# Patient Record
Sex: Female | Born: 1970 | Race: White | Hispanic: No | Marital: Married | Smoking: Never smoker
Health system: Southern US, Community
[De-identification: ages and names within clinical notes are randomized; demographics above are authoritative.]

## PROBLEM LIST (undated history)

## (undated) DIAGNOSIS — I4719 Other supraventricular tachycardia: Secondary | ICD-10-CM

## (undated) DIAGNOSIS — Z800 Family history of malignant neoplasm of digestive organs: Secondary | ICD-10-CM

## (undated) DIAGNOSIS — I491 Atrial premature depolarization: Secondary | ICD-10-CM

## (undated) DIAGNOSIS — I4729 Other ventricular tachycardia: Secondary | ICD-10-CM

## (undated) DIAGNOSIS — I471 Supraventricular tachycardia: Secondary | ICD-10-CM

## (undated) DIAGNOSIS — Z972 Presence of dental prosthetic device (complete) (partial): Secondary | ICD-10-CM

## (undated) DIAGNOSIS — R079 Chest pain, unspecified: Secondary | ICD-10-CM

## (undated) DIAGNOSIS — Z801 Family history of malignant neoplasm of trachea, bronchus and lung: Secondary | ICD-10-CM

## (undated) DIAGNOSIS — Z803 Family history of malignant neoplasm of breast: Secondary | ICD-10-CM

## (undated) DIAGNOSIS — I340 Nonrheumatic mitral (valve) insufficiency: Secondary | ICD-10-CM

## (undated) DIAGNOSIS — I10 Essential (primary) hypertension: Secondary | ICD-10-CM

## (undated) HISTORY — DX: Nonrheumatic mitral (valve) insufficiency: I34.0

## (undated) HISTORY — DX: Family history of malignant neoplasm of digestive organs: Z80.0

## (undated) HISTORY — DX: Supraventricular tachycardia: I47.1

## (undated) HISTORY — DX: Other ventricular tachycardia: I47.29

## (undated) HISTORY — PX: CHOLECYSTECTOMY: SHX55

## (undated) HISTORY — DX: Chest pain, unspecified: R07.9

## (undated) HISTORY — PX: CARPAL TUNNEL RELEASE: SHX101

## (undated) HISTORY — PX: TUBAL LIGATION: SHX77

## (undated) HISTORY — DX: Family history of malignant neoplasm of trachea, bronchus and lung: Z80.1

## (undated) HISTORY — DX: Other supraventricular tachycardia: I47.19

## (undated) HISTORY — DX: Family history of malignant neoplasm of breast: Z80.3

## (undated) HISTORY — DX: Atrial premature depolarization: I49.1

---

## 2008-06-11 ENCOUNTER — Emergency Department: Payer: Self-pay | Admitting: Internal Medicine

## 2011-07-19 ENCOUNTER — Ambulatory Visit: Payer: Self-pay

## 2011-10-31 ENCOUNTER — Ambulatory Visit (INDEPENDENT_AMBULATORY_CARE_PROVIDER_SITE_OTHER): Payer: BC Managed Care – PPO | Admitting: Family Medicine

## 2011-10-31 ENCOUNTER — Encounter: Payer: Self-pay | Admitting: Family Medicine

## 2011-10-31 VITALS — BP 100/70 | HR 60 | Temp 98.5°F | Ht 65.25 in | Wt 143.0 lb

## 2011-10-31 DIAGNOSIS — Z136 Encounter for screening for cardiovascular disorders: Secondary | ICD-10-CM

## 2011-10-31 DIAGNOSIS — M7710 Lateral epicondylitis, unspecified elbow: Secondary | ICD-10-CM | POA: Insufficient documentation

## 2011-10-31 DIAGNOSIS — Z0000 Encounter for general adult medical examination without abnormal findings: Secondary | ICD-10-CM

## 2011-10-31 LAB — BASIC METABOLIC PANEL
BUN: 12 mg/dL (ref 6–23)
Calcium: 9 mg/dL (ref 8.4–10.5)
Creatinine, Ser: 0.8 mg/dL (ref 0.4–1.2)
GFR: 87.11 mL/min (ref >60.00)
Glucose, Bld: 87 mg/dL (ref 70–99)
Sodium: 141 mEq/L (ref 135–145)

## 2011-10-31 NOTE — Progress Notes (Signed)
  Subjective:    Patient ID: Tammy Acevedo, female    DOB: May 24, 1971, 41 y.o.   MRN: [id number]  HPI  41 yo here to establish care.  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 Has noticed aching in lateral aspects of elbow, worse with movement, can radiate to forearm. No tingling, no RUE weakness.  G2P2- goes to [employer] [facility]. Normal pap smear and mammogram in 07/2011.  Very healthy with no other complaints.  No past medical history on file. Past Surgical History  Procedure Date  . Cholecystectomy    History  Substance Use Topics  . Smoking status: Never Smoker   . Smokeless tobacco: Never Used  . Alcohol Use: No   Family History  Problem Relation Age of Onset  . Diabetes Mother   . COPD Father    No Known Allergies No current outpatient prescriptions on file prior to visit.   The PMH, PSH, Social History, Family History, Medications, and allergies have been reviewed in [facility], and have been updated if relevant.   Review of Systems See HPI    Patient reports no  vision/ hearing changes,anorexia, weight change, fever ,adenopathy, persistant / recurrent hoarseness, swallowing issues, chest pain, edema,persistant / recurrent cough, hemoptysis, dyspnea(rest, exertional, paroxysmal nocturnal), gastrointestinal  bleeding (melena, rectal bleeding), abdominal pain  Objective:   Physical Exam BP 100/70  Pulse 60  Temp(Src) 98.5 F (36.9 C) (Oral)  Ht 5' 5.25" (1.657 m)  Wt 143 lb (64.864 kg)  BMI 23.61 kg/m2  LMP 10/28/2011  General:  Well-developed,well-nourished,in no acute distress; alert,appropriate and cooperative throughout examination Head:  normocephalic and atraumatic.   Eyes:  vision grossly intact, pupils equal, pupils round, and pupils reactive to light.   Ears:  R ear normal and L ear normal.   Lungs:  Normal respiratory effort, chest expands symmetrically. Lungs are clear to auscultation,  no crackles or wheezes. Heart:  Normal rate and regular rhythm. S1 and S2 normal without gallop, murmur, click, rub or other extra sounds. Msk:  No deformity or scoliosis noted of thoracic or lumbar spine.   Extremities:  No clubbing, cyanosis, edema, or deformity noted with normal full range of motion of all joints.  Mild TTP over lateral Neurologic:  alert & oriented X3 and gait normal.   Skin:  Intact without suspicious lesions or rashes Psych:  Cognition and judgment appear intact. Alert and cooperative with normal attention span and concentration. No apparent delusions, illusions, hallucinations        Assessment & Plan:   1. Lateral epicondylitis     New.   Discussed exercises from sports med handout and other supportive measures.   See pt instructions for full details. The patient indicates understanding of these issues and agrees with the plan.

## 2011-10-31 NOTE — Patient Instructions (Signed)
Great to meet you. Try to avoid painful activities (overhead activities, lifting with extended arm) as much as possible. Aleve and/or tylenol as needed for pain [place] exercises as in handout.

## 2012-03-13 ENCOUNTER — Ambulatory Visit: Payer: BC Managed Care – PPO | Admitting: Family Medicine

## 2012-03-20 ENCOUNTER — Ambulatory Visit (INDEPENDENT_AMBULATORY_CARE_PROVIDER_SITE_OTHER): Payer: BC Managed Care – PPO | Admitting: Family Medicine

## 2012-03-20 ENCOUNTER — Encounter: Payer: Self-pay | Admitting: Family Medicine

## 2012-03-20 VITALS — BP 112/68 | HR 60 | Temp 97.9°F | Wt 146.0 lb

## 2012-03-20 DIAGNOSIS — G5603 Carpal tunnel syndrome, bilateral upper limbs: Secondary | ICD-10-CM | POA: Insufficient documentation

## 2012-03-20 DIAGNOSIS — G5600 Carpal tunnel syndrome, unspecified upper limb: Secondary | ICD-10-CM

## 2012-03-20 MED ORDER — MELOXICAM 15 MG PO TABS: 15.0000 mg | ORAL_TABLET | Freq: Every day | ORAL | Status: DC

## 2012-03-20 NOTE — Progress Notes (Signed)
.    Subjective:    Patient ID: Tammy Acevedo, female    DOB: [birth year], 41 y.o.   MRN: [id number]  HPI  41 yo here for bilateral hand numbness-waking her up from her sleep. Now occuring every night- can "shake it out" when she changes position. No UE weakness.  She has a wrist splint for her right hand but she finds it uncomfortable to wear although helps with tingling.    No past medical history on file. Past Surgical History  Procedure Date  . Cholecystectomy    History  Substance Use Topics  . Smoking status: Never Smoker   . Smokeless tobacco: Never Used  . Alcohol Use: No   Family History  Problem Relation Age of Onset  . Diabetes Mother   . COPD Father    No Known Allergies No current outpatient prescriptions on file prior to visit.   The PMH, PSH, Social History, Family History, Medications, and allergies have been reviewed in [facility], and have been updated if relevant.   Review of Systems See HPI    Patient reports no  vision/ hearing changes,anorexia, weight change, fever ,adenopathy, persistant / recurrent hoarseness, swallowing issues, chest pain, edema,persistant / recurrent cough, hemoptysis, dyspnea(rest, exertional, paroxysmal nocturnal), gastrointestinal  bleeding (melena, rectal bleeding), abdominal pain  Objective:   Physical Exam BP 112/68  Pulse 60  Temp 97.9 F (36.6 C)  Wt 146 lb (66.225 kg)  General:  Well-developed,well-nourished,in no acute distress; alert,appropriate and cooperative throughout examination Head:  normocephalic and atraumatic.   Eyes:  vision grossly intact, pupils equal, pupils round, and pupils reactive to light.   Ears:  R ear normal and L ear normal.   Lungs:  Normal respiratory effort, chest expands symmetrically. Lungs are clear to auscultation, no crackles or wheezes. Heart:  Normal rate and regular rhythm. S1 and S2 normal without gallop, murmur, click, rub or other extra sounds. Msk:  No deformity or scoliosis noted of  thoracic or lumbar spine.   Extremities:  No clubbing, cyanosis, edema, or deformity noted with normal full range of motion of all joints.   Pos tinnels bilateral, neg phallen Neurologic:  alert & oriented X3 and gait normal.   Skin:  Intact without suspicious lesions or rashes Psych:  Cognition and judgment appear intact. Alert and cooperative with normal attention span and concentration. No apparent delusions, illusions, hallucinations     Assessment & Plan:    1. Carpal tunnel syndrome on both sides  Ambulatory referral to Orthopedic Surgery   Deteriorated- will refer to ortho for further work up and treatment options. The patient indicates understanding of these issues and agrees with the plan.

## 2012-03-20 NOTE — Patient Instructions (Addendum)
Carpal Tunnel Syndrome The carpal tunnel is a narrow hollow area in the wrist. It is formed by the wrist bones and ligaments. Nerves, blood vessels, and tendons (cord like structures which attach muscle to bone) on the palm side (the side of your hand in the direction your fingers bend) of your hand pass through the carpal tunnel. Repeated wrist motion or certain diseases may cause swelling within the tunnel. (That is why these are called repetitive trauma (damage caused by over use) disorders. It is also a common problem in late pregnancy.) This swelling pinches the main nerve in the wrist (median nerve) and causes the painful condition called carpal tunnel syndrome. A feeling of "pins and needles" may be noticed in the fingers or hand; however, the entire arm may ache from this condition. Carpal tunnel syndrome may clear up by itself. Cortisone injections may help. Sometimes, an operation may be needed to free the pinched nerve. An electromyogram (a type of test) may be needed to confirm this diagnosis (learning what is wrong). This is a test which measures nerve conduction. The nerve conduction is usually slowed in a carpal tunnel syndrome. HOME CARE INSTRUCTIONS   If your caregiver prescribed medication to help reduce swelling, take as directed.   If you were given a splint to keep your wrist from bending, use it as instructed. It is important to wear the splint at night. Use the splint for as long as you have pain or numbness in your hand, arm or wrist. This may take 1 to 2 months.   If you have pain at night, it may help to rub or shake your hand, or elevate your hand above the level of your heart (the center of your chest).   It is important to give your wrist a rest by stopping the activities that are causing the problem. If your symptoms (problems) are work-related, you may need to talk to your employer about changing to a job that does not require using your wrist.   Only take over-the-counter  or prescription medicines for pain, discomfort, or fever as directed by your caregiver.   Following periods of extended use, particularly strenuous use, apply an ice pack wrapped in a towel to the anterior (palm) side of the affected wrist for 20 to 30 minutes. Repeat as needed three to four times per day. This will help reduce the swelling.   Follow all instructions for follow-up with your caregiver. This includes any orthopedic referrals, physical therapy, and rehabilitation. Any delay in obtaining necessary care could result in a delay or failure of your condition to heal.  SEEK IMMEDIATE MEDICAL CARE IF:   You are still having pain and numbness following a week of treatment.   You develop new, unexplained symptoms.   Your current symptoms are getting worse and are not helped or controlled with medications.  MAKE SURE YOU:   Understand these instructions.   Will watch your condition.   Will get help right away if you are not doing well or get worse.  Document Released: 09/14/2000 Document Revised: 09/06/2011 Document Reviewed: 08/03/2011 Exit[employer] Patient Information 2012 [employer]. 

## 2012-03-28 ENCOUNTER — Ambulatory Visit (INDEPENDENT_AMBULATORY_CARE_PROVIDER_SITE_OTHER): Payer: BC Managed Care – PPO | Admitting: Family Medicine

## 2012-03-28 DIAGNOSIS — M549 Dorsalgia, unspecified: Secondary | ICD-10-CM

## 2012-03-28 DIAGNOSIS — M542 Cervicalgia: Secondary | ICD-10-CM

## 2012-03-28 MED ORDER — CYCLOBENZAPRINE HCL 10 MG PO TABS: 10.0000 mg | ORAL_TABLET | Freq: Three times a day (TID) | ORAL | Status: AC | PRN

## 2012-03-28 NOTE — Patient Instructions (Addendum)
You are going to be really sore the next few days. Take muscle relaxants (flexeril as directed). Use heat. Take the meloxicam. Call me if not getting better in next week.
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
## 2012-03-31 ENCOUNTER — Encounter: Payer: Self-pay | Admitting: *Deleted

## 2012-03-31 ENCOUNTER — Telehealth: Payer: Self-pay | Admitting: *Deleted

## 2012-03-31 NOTE — Telephone Encounter (Signed)
Patient called stating that she is not able to go back to work today and would like a work note to be out this week. Please advise.

## 2012-03-31 NOTE — Telephone Encounter (Signed)
Ok to write note as pt requests. 

## 2012-03-31 NOTE — Telephone Encounter (Signed)
Letter written and patient notified. Letter placed up front for pick up.

## 2012-12-12 ENCOUNTER — Other Ambulatory Visit: Payer: Self-pay | Admitting: Family Medicine

## 2012-12-12 DIAGNOSIS — Z0000 Encounter for general adult medical examination without abnormal findings: Secondary | ICD-10-CM

## 2012-12-12 DIAGNOSIS — Z136 Encounter for screening for cardiovascular disorders: Secondary | ICD-10-CM

## 2012-12-15 ENCOUNTER — Other Ambulatory Visit (INDEPENDENT_AMBULATORY_CARE_PROVIDER_SITE_OTHER): Payer: BC Managed Care – PPO

## 2012-12-15 DIAGNOSIS — Z0000 Encounter for general adult medical examination without abnormal findings: Secondary | ICD-10-CM

## 2012-12-15 DIAGNOSIS — Z136 Encounter for screening for cardiovascular disorders: Secondary | ICD-10-CM

## 2012-12-15 LAB — COMPREHENSIVE METABOLIC PANEL
Albumin: 3.9 g/dL (ref 3.5–5.2)
CO2: 28 mEq/L (ref 19–32)
Chloride: 102 mEq/L (ref 96–112)
GFR: 82.91 mL/min (ref >60.00)
Glucose, Bld: 107 mg/dL — ABNORMAL HIGH (ref 70–99)
Potassium: 3.9 mEq/L (ref 3.5–5.1)
Sodium: 137 mEq/L (ref 135–145)
Total Protein: 7 g/dL (ref 6.0–8.3)

## 2012-12-15 LAB — LIPID PANEL
Cholesterol: 146 mg/dL (ref 0–200)
LDL Cholesterol: 93 mg/dL (ref 0–99)

## 2012-12-22 ENCOUNTER — Encounter: Payer: Self-pay | Admitting: Family Medicine

## 2012-12-22 ENCOUNTER — Ambulatory Visit (INDEPENDENT_AMBULATORY_CARE_PROVIDER_SITE_OTHER): Payer: BC Managed Care – PPO | Admitting: Family Medicine

## 2012-12-22 ENCOUNTER — Other Ambulatory Visit (HOSPITAL_COMMUNITY)
Admission: RE | Admit: 2012-12-22 | Discharge: 2012-12-22 | Disposition: A | Discharge status: Home / Self Care | Payer: BC Managed Care – PPO | Source: Ambulatory Visit | Attending: Family Medicine | Admitting: Family Medicine

## 2012-12-22 VITALS — BP 122/70 | HR 64 | Temp 98.0°F | Ht 65.5 in | Wt 150.0 lb

## 2012-12-22 DIAGNOSIS — G5600 Carpal tunnel syndrome, unspecified upper limb: Secondary | ICD-10-CM

## 2012-12-22 DIAGNOSIS — Z0000 Encounter for general adult medical examination without abnormal findings: Secondary | ICD-10-CM

## 2012-12-22 DIAGNOSIS — Z01419 Encounter for gynecological examination (general) (routine) without abnormal findings: Secondary | ICD-10-CM | POA: Insufficient documentation

## 2012-12-22 DIAGNOSIS — Z23 Encounter for immunization: Secondary | ICD-10-CM

## 2012-12-22 DIAGNOSIS — Z1151 Encounter for screening for human papillomavirus (HPV): Secondary | ICD-10-CM | POA: Insufficient documentation

## 2012-12-22 DIAGNOSIS — G5603 Carpal tunnel syndrome, bilateral upper limbs: Secondary | ICD-10-CM

## 2012-12-22 DIAGNOSIS — Z1231 Encounter for screening mammogram for malignant neoplasm of breast: Secondary | ICD-10-CM

## 2012-12-22 NOTE — Addendum Note (Signed)
Addended by: Dianne Dun on: 12/22/2012 09:43 AM   Modules accepted: Orders

## 2012-12-22 NOTE — Patient Instructions (Addendum)
Good to see you. Please call [facility] to set up your mammogram.

## 2012-12-22 NOTE — Addendum Note (Signed)
Addended by: Eliezer Bottom on: 12/22/2012 10:20 AM   Modules accepted: Orders

## 2012-12-22 NOTE — Progress Notes (Addendum)
Subjective:    Patient ID: Tammy Acevedo, female    DOB: 05/08/1971, 42 y.o.   MRN: [id number]  HPI  Very pleasant 42 yo G2P2 here for CPX.    G2P2- had been going to [place] [facility] but wants to start having her pap smears done here.  LMP was last week.  No h/o abnormal pap smears.   She thinks her last pap smear and mammogram were in 07/2011.    Lab Results  Component Value Date   CHOL 146 12/15/2012   HDL 38.90* 12/15/2012   LDLCALC 93 12/15/2012   TRIG 72.0 12/15/2012   CHOLHDL 4 12/15/2012   Bilateral carpal tunnel- wearing brace, saw ortho in [place].  Cortisone injections did not help and she would like a second opinion.  No past medical history on file. Past Surgical History  Procedure Laterality Date  . Cholecystectomy     History  Substance Use Topics  . Smoking status: Never Smoker   . Smokeless tobacco: Never Used  . Alcohol Use: No   Family History  Problem Relation Age of Onset  . Diabetes Mother   . COPD Father    No Known Allergies No current outpatient prescriptions on file prior to visit.   No current facility-administered medications on file prior to visit.   The PMH, PSH, Social History, Family History, Medications, and allergies have been reviewed in [facility], and have been updated if relevant.   Review of Systems See HPI    Patient reports no  vision/ hearing changes,anorexia, weight change, fever ,adenopathy, persistant / recurrent hoarseness, swallowing issues, chest pain, edema,persistant / recurrent cough, hemoptysis, dyspnea(rest, exertional, paroxysmal nocturnal), gastrointestinal  bleeding (melena, rectal bleeding), abdominal pain  Objective:   Physical Exam BP 122/70  Pulse 64  Temp(Src) 98 F (36.7 C)  Ht 5' 5.5" (1.664 m)  Wt 150 lb (68.04 kg)  BMI 24.57 kg/m2   General:  Well-developed,well-nourished,in no acute distress; alert,appropriate and cooperative throughout examination Head:  normocephalic and atraumatic.   Eyes:  vision  grossly intact, pupils equal, pupils round, and pupils reactive to light.   Ears:  R ear normal and L ear normal.   Nose:  no external deformity.   Mouth:  good dentition.   Neck:  No deformities, masses, or tenderness noted. Breasts:  No mass, nodules, thickening, tenderness, bulging, retraction, inflamation, nipple discharge or skin changes noted.   Lungs:  Normal respiratory effort, chest expands symmetrically. Lungs are clear to auscultation, no crackles or wheezes. Heart:  Normal rate and regular rhythm. S1 and S2 normal without gallop, murmur, click, rub or other extra sounds. Abdomen:  Bowel sounds positive,abdomen soft and non-tender without masses, organomegaly or hernias noted. Rectal:  no external abnormalities.   Genitalia:  Pelvic Exam:        External: normal female genitalia without lesions or masses        Vagina: normal without lesions or masses        Cervix: normal without lesions or masses        Adnexa: normal bimanual exam without masses or fullness        Uterus: normal by palpation        Pap smear: performed Msk:  No deformity or scoliosis noted of thoracic or lumbar spine.   Extremities:  No clubbing, cyanosis, edema, or deformity noted with normal full range of motion of all joints.   Neurologic:  alert & oriented X3 and gait normal.   Skin:  Intact without  suspicious lesions or rashes Cervical Nodes:  No lymphadenopathy noted Axillary Nodes:  No palpable lymphadenopathy Psych:  Cognition and judgment appear intact. Alert and cooperative with normal attention span and concentration. No apparent delusions, illusions, hallucinations         Assessment & Plan:   1. Routine general medical examination at a health care facility Reviewed preventive care protocols, scheduled due services, and updated immunizations Discussed nutrition, exercise, diet, and healthy lifestyle.  Tdap   3. Carpal tunnel syndrome on both sides  - Ambulatory referral to Orthopedic  Surgery

## 2012-12-22 NOTE — Addendum Note (Signed)
Addended by: Eliezer Bottom on: 12/22/2012 12:15 PM   Modules accepted: Orders

## 2012-12-26 ENCOUNTER — Encounter: Payer: Self-pay | Admitting: Family Medicine

## 2012-12-26 ENCOUNTER — Encounter: Payer: Self-pay | Admitting: *Deleted

## 2012-12-26 LAB — HM PAP SMEAR: HM Pap smear: NORMAL

## 2013-01-07 ENCOUNTER — Encounter: Payer: Self-pay | Admitting: Family Medicine

## 2013-01-07 ENCOUNTER — Ambulatory Visit: Payer: Self-pay | Admitting: Family Medicine

## 2013-01-08 ENCOUNTER — Ambulatory Visit: Payer: Self-pay | Admitting: Family Medicine

## 2013-01-09 ENCOUNTER — Encounter: Payer: Self-pay | Admitting: Family Medicine

## 2013-01-12 ENCOUNTER — Other Ambulatory Visit: Payer: Self-pay | Admitting: Family Medicine

## 2013-01-12 DIAGNOSIS — R928 Other abnormal and inconclusive findings on diagnostic imaging of breast: Secondary | ICD-10-CM

## 2013-01-21 ENCOUNTER — Ambulatory Visit: Payer: Self-pay | Admitting: Specialist

## 2013-01-28 ENCOUNTER — Ambulatory Visit: Payer: Self-pay | Admitting: Specialist

## 2013-03-31 ENCOUNTER — Emergency Department: Payer: Self-pay | Admitting: Emergency Medicine

## 2013-07-14 ENCOUNTER — Encounter: Payer: Self-pay | Admitting: Family Medicine

## 2013-07-14 ENCOUNTER — Ambulatory Visit: Payer: Self-pay | Admitting: Family Medicine

## 2013-07-15 ENCOUNTER — Encounter: Payer: Self-pay | Admitting: Family Medicine

## 2014-04-13 ENCOUNTER — Encounter: Payer: BC Managed Care – PPO | Admitting: Family Medicine

## 2014-05-03 ENCOUNTER — Ambulatory Visit: Payer: Self-pay | Admitting: Internal Medicine

## 2015-01-14 ENCOUNTER — Other Ambulatory Visit: Payer: Self-pay | Admitting: Internal Medicine

## 2015-01-14 DIAGNOSIS — Z1231 Encounter for screening mammogram for malignant neoplasm of breast: Secondary | ICD-10-CM

## 2015-01-21 NOTE — Op Note (Signed)
PATIENT NAME:  Randolm IdolDWARDS, Torra L MR#:  [id number] DATE OF BIRTH:  November 02, 1970  DATE OF PROCEDURE:  01/28/2013  PREOPERATIVE DIAGNOSIS: Right carpal tunnel syndrome.   POSTOPERATIVE DIAGNOSIS: Right carpal tunnel syndrome.  PROCEDURE: Right carpal tunnel release.   SURGEON: Myra Rudehristopher Samaa Ueda, M.D.   ANESTHESIA: General.   COMPLICATIONS: None.   TOURNIQUET TIME: 15 minutes.   PROCEDURE: After adequate induction of general anesthesia, the right upper extremity is thoroughly prepped with alcohol and ChloraPrep and draped in standard sterile fashion. The extremity is wrapped out with the Esmarch bandage and pneumatic tourniquet elevated to 250 mmHg. Under loupe magnification, standard volar carpal tunnel incision is made and the dissection carefully carried down to the transverse retinacular ligament. This is incised in the midportion with the knife. The distal release is performed with the small scissors. The proximal release is performed with the small scissors and the carpal tunnel scissors. There is seen to be moderate compression of the nerve directly beneath the ligament. Careful check is made both proximally and distally to ensure that complete release had been obtained. The wound is thoroughly irrigated multiple times. Skin edges are infiltrated with 0.5% plain Marcaine. The skin is closed with 4-0 nylon. A soft bulky dressing is applied. The patient is returned to the recovery room in satisfactory condition having tolerated the procedure quite well. ____________________________ Clare Gandyhristopher E. Andray Assefa, MD ces:aw D: 01/29/2013 07:36:31 ET T: 01/29/2013 07:47:03 ET JOB#: [id number]  cc: Clare Gandyhristopher E. Basil Buffin, MD, <Dictator> Clare GandyHRISTOPHER E Samyiah Halvorsen MD ELECTRONICALLY SIGNED 01/29/2013 13:33

## 2015-05-06 ENCOUNTER — Ambulatory Visit
Admission: RE | Admit: 2015-05-06 | Discharge: 2015-05-06 | Disposition: A | Discharge status: Home / Self Care | Payer: 59 | Source: Ambulatory Visit | Attending: Internal Medicine | Admitting: Internal Medicine

## 2015-05-06 DIAGNOSIS — Z1231 Encounter for screening mammogram for malignant neoplasm of breast: Secondary | ICD-10-CM | POA: Diagnosis not present

## 2015-07-12 ENCOUNTER — Encounter: Payer: Self-pay | Admitting: Internal Medicine

## 2015-07-13 ENCOUNTER — Encounter: Payer: Self-pay | Admitting: Internal Medicine

## 2015-07-13 ENCOUNTER — Ambulatory Visit (INDEPENDENT_AMBULATORY_CARE_PROVIDER_SITE_OTHER): Payer: 59 | Admitting: Internal Medicine

## 2015-07-13 VITALS — BP 130/70 | HR 72 | Ht 65.5 in | Wt 149.4 lb

## 2015-07-13 DIAGNOSIS — K219 Gastro-esophageal reflux disease without esophagitis: Secondary | ICD-10-CM | POA: Diagnosis not present

## 2015-07-13 DIAGNOSIS — R000 Tachycardia, unspecified: Secondary | ICD-10-CM | POA: Diagnosis not present
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
## 2015-07-14 LAB — H. PYLORI ANTIBODY, IGG: H Pylori IgG: <0.9 U/mL (ref 0.0–0.8)

## 2015-07-14 LAB — THYROID PANEL WITH TSH
FREE THYROXINE INDEX: 2.4 (ref 1.2–4.9)
T3 Uptake Ratio: 24 % (ref 24–39)
T4 TOTAL: 9.8 ug/dL (ref 4.5–12.0)
TSH: 2.42 u[IU]/mL (ref 0.450–4.500)

## 2015-07-14 LAB — CBC WITH DIFFERENTIAL/PLATELET
BASOS ABS: 0 10*3/uL (ref 0.0–0.2)
BASOS: 0 %
EOS (ABSOLUTE): 0.1 10*3/uL (ref 0.0–0.4)
Eos: 1 %
Hematocrit: 38.6 % (ref 34.0–46.6)
Hemoglobin: 12.6 g/dL (ref 11.1–15.9)
IMMATURE GRANS (ABS): 0 10*3/uL (ref 0.0–0.1)
Immature Granulocytes: 0 %
Lymphocytes Absolute: 2 10*3/uL (ref 0.7–3.1)
Lymphs: 32 %
MCH: 27.9 pg (ref 26.6–33.0)
MCHC: 32.6 g/dL (ref 31.5–35.7)
MCV: 85 fL (ref 79–97)
Monocytes Absolute: 0.4 10*3/uL (ref 0.1–0.9)
Monocytes: 7 %
NEUTROS PCT: 60 %
Neutrophils Absolute: 3.7 10*3/uL (ref 1.4–7.0)
PLATELETS: 245 10*3/uL (ref 150–379)
RBC: 4.52 x10E6/uL (ref 3.77–5.28)
RDW: 13.8 % (ref 12.3–15.4)
WBC: 6.2 10*3/uL (ref 3.4–10.8)

## 2015-07-14 LAB — COMPREHENSIVE METABOLIC PANEL
A/G RATIO: 1.5 (ref 1.1–2.5)
ALT: 8 IU/L (ref 0–32)
AST: 11 IU/L (ref 0–40)
Albumin: 4 g/dL (ref 3.5–5.5)
Alkaline Phosphatase: 79 IU/L (ref 39–117)
BILIRUBIN TOTAL: 0.5 mg/dL (ref 0.0–1.2)
BUN / CREAT RATIO: 11 (ref 9–23)
BUN: 9 mg/dL (ref 6–24)
CO2: 24 mmol/L (ref 18–29)
Calcium: 8.6 mg/dL — ABNORMAL LOW (ref 8.7–10.2)
Chloride: 103 mmol/L (ref 97–108)
Creatinine, Ser: 0.79 mg/dL (ref 0.57–1.00)
GFR calc Af Amer: 104 mL/min/{1.73_m2} (ref >59)
GFR calc non Af Amer: 90 mL/min/{1.73_m2} (ref >59)
Globulin, Total: 2.6 g/dL (ref 1.5–4.5)
Glucose: 64 mg/dL — ABNORMAL LOW (ref 65–99)
POTASSIUM: 4.3 mmol/L (ref 3.5–5.2)
Sodium: 146 mmol/L — ABNORMAL HIGH (ref 134–144)
Total Protein: 6.6 g/dL (ref 6.0–8.5)

## 2016-01-11 ENCOUNTER — Encounter: Payer: Self-pay | Admitting: Emergency Medicine

## 2016-01-11 ENCOUNTER — Emergency Department
Admission: EM | Admit: 2016-01-11 | Discharge: 2016-01-11 | Disposition: A | Discharge status: Home / Self Care | Payer: 59 | Attending: Emergency Medicine | Admitting: Emergency Medicine

## 2016-01-11 DIAGNOSIS — I471 Supraventricular tachycardia: Secondary | ICD-10-CM | POA: Insufficient documentation

## 2016-01-11 DIAGNOSIS — R000 Tachycardia, unspecified: Secondary | ICD-10-CM | POA: Diagnosis present

## 2016-01-11 MED ORDER — METOPROLOL TARTRATE 25 MG PO TABS
25.0000 mg | ORAL_TABLET | Freq: Once | ORAL | Status: AC
Start: 2016-01-11 — End: 2016-01-11
  Administered 2016-01-11: 25 mg via ORAL
  Filled 2016-01-11: qty 1

## 2016-01-11 MED ORDER — METOPROLOL TARTRATE 25 MG PO TABS: 25.0000 mg | ORAL_TABLET | Freq: Two times a day (BID) | ORAL | Status: DC

## 2016-01-11 NOTE — ED Notes (Signed)
MD at bedside. 
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
## 2016-01-11 NOTE — ED Notes (Signed)
Pt from work via EMS for "feeling fait" and dizziness, pt was in SVT in 240s with EMS, pt blew into syringe and converted into 113 and sustained. Pt A&O , c/o some dizziness and blurred vision. Pt also states her CP has been relieved some.

## 2016-01-11 NOTE — Discharge Instructions (Signed)
If you have palpitations again try to push down or use other methods as we discussed for conversion. If you have chest pain or shortness of breath or feeling lightheaded call 911. If the symptom last for more than 15 minutes, call 911. We will start you on metoprolol, which is a blood pressure medication but also should control the rhythm of your heart and hopefully stop this from happening. Take it twice a day. Be careful, this can make you lightheaded so especially in the early days of taking it, be careful about getting out of bed to go to the bathroom or standing up quickly. If you becomes very lightheaded stop the medication and talk to your heart doctor. Follow closely with cardiology.

## 2016-01-18 ENCOUNTER — Encounter: Payer: Self-pay | Admitting: Cardiology

## 2016-01-18 ENCOUNTER — Ambulatory Visit (INDEPENDENT_AMBULATORY_CARE_PROVIDER_SITE_OTHER): Payer: 59 | Admitting: Cardiology

## 2016-01-18 VITALS — BP 98/60 | HR 60 | Ht 65.0 in | Wt 152.2 lb

## 2016-01-18 DIAGNOSIS — I471 Supraventricular tachycardia: Secondary | ICD-10-CM

## 2016-01-18 DIAGNOSIS — R002 Palpitations: Secondary | ICD-10-CM

## 2016-01-18 NOTE — Progress Notes (Signed)
Cardiology Office Note   Date:  01/18/2016   ID:  Tammy Acevedo, DOB Jan 01, 1971, MRN [id number]  Referring Doctor:  Bari Edward, MD   Cardiologist:   Almond Lint, MD   Reason for consultation:  Chief Complaint  Patient presents with  . other    Self ref for SVT; patient was at [facility] ER for rapid heart beats. Pt. is a former patient of Dr. Juliann Pares      History of Present Illness: Tammy Acevedo is a 45 y.o. female who presents for evaluation of SVT. Patient has been having episodes of palpitations/ rapid heartbeat/heart pounding associated with dizziness and feeling warm all of a sudden. Episodes have been going on for the last 5 years but happened probably every 3-6 months. Episodes range in duration from lasting very briefly to a few minutes. Symptoms occur in the chest mainly. Moderate to severe intensity. The last episode she had was 01/11/2016. Prior to that, episode was in October 2016. Last week, she was at a meeting when all of a sudden she had this pounding heart associated with dizziness and feeling hot. Didn't go away right away and the staff had to call 911. EMS tells her that she was having an episode of SVT. Per ER documentation, the heart rate was 240 bpm. She was told to do a Valsalva maneuver by EMS. Eventually episode resolved. No EKG or rhythm strip off the SVT available for review. Patient had been started on metoprolol from the ER 01/11/2016.  Otherwise, she had no other episodes of chest pain, shortness breath, loss of consciousness, headache, fever, cough, colds, abdominal pain, orthopnea, PND, edema.  ROS:  Please see the history of present illness. Aside from mentioned under HPI, all other systems are reviewed and negative.    History reviewed. No pertinent past medical history. Indigestion for which she takes omeprazole  Past Surgical History  Procedure Laterality Date  . Cholecystectomy    . Carpal tunnel release    . Tubal ligation        reports that she has never smoked. She has never used smokeless tobacco. She reports that she does not drink alcohol or use illicit drugs.   family history includes Breast cancer in her maternal aunt and paternal aunt; COPD in her father; Diabetes in her mother.   Current Outpatient Prescriptions  Medication Sig Dispense Refill  . metoprolol tartrate (LOPRESSOR) 25 MG tablet Take 1 tablet (25 mg total) by mouth 2 (two) times daily. 60 tablet 0   No current facility-administered medications for this visit.    Allergies: Review of patient's allergies indicates no known allergies.    PHYSICAL EXAM: VS:  BP 98/60 mmHg  Pulse 60  Ht  (1.651 m)  Wt 152 lb 4 oz (69.06 kg)  BMI 25.34 kg/m2  LMP 01/06/2016 , Body mass index is 25.34 kg/(m^2). Wt Readings from Last 3 Encounters:  01/18/16 152 lb 4 oz (69.06 kg)  07/13/15 149 lb 6.4 oz (67.767 kg)  12/22/12 150 lb (68.04 kg)    GENERAL:  well developed, well nourished, not in acute distress HEENT: normocephalic, pink conjunctivae, anicteric sclerae, no xanthelasma, normal dentition, oropharynx clear NECK:  no neck vein engorgement, JVP normal, no hepatojugular reflux, carotid upstroke brisk and symmetric, no bruit, no thyromegaly, no lymphadenopathy LUNGS:  good respiratory effort, clear to auscultation bilaterally CV:  PMI not displaced, no thrills, no lifts, S1 and S2 within normal limits, no palpable S3 or S4, no murmurs,  no rubs, no gallops ABD:  Soft, nontender, nondistended, normoactive bowel sounds, no abdominal aortic bruit, no hepatomegaly, no splenomegaly MS: nontender back, no kyphosis, no scoliosis, no joint deformities EXT:  2+ DP/PT pulses, no edema, no varicosities, no cyanosis, no clubbing SKIN: warm, nondiaphoretic, normal turgor, no ulcers NEUROPSYCH: alert, oriented to person, place, and time, sensory/motor grossly intact, normal mood, appropriate affect  Recent Labs: 07/13/2015: ALT 8; BUN 9; Creatinine, Ser  0.79; Platelets 245; Potassium 4.3; Sodium 146*; TSH 2.420   Lipid Panel    Component Value Date/Time   CHOL 146 12/15/2012 0857   TRIG 72.0 12/15/2012 0857   HDL 38.90* 12/15/2012 0857   CHOLHDL 4 12/15/2012 0857   VLDL 14.4 12/15/2012 0857   LDLCALC 93 12/15/2012 0857     Other studies Reviewed:  EKG:  The ekg from 01/18/2016 was personally reviewed by me and it revealed sinus rhythm. PR 104 ms. Early transition.  Additional studies/ records that were reviewed personally reviewed by me today include:  Echocardiogram 08/05/2015: [facility] NORMAL LEFT VENTRICULAR SYSTOLIC FUNCTION WITH AN ESTIMATED EF = 50 % NORMAL RIGHT VENTRICULAR SYSTOLIC FUNCTION MILD VALVULAR REGURGITATION (See above) mitral  NO VALVULAR STENOSIS  ASSESSMENT AND PLAN:  Palpitations Probably SVT, but no available rhythm strips for review [facility], Dr. [place]n [place] Echo unremarkable. Patient was told that her stresses was normal. Patient already started on metoprolol. Recommend watchful waiting. If another episode of possible SVT occurs despite being on a beta blocker, will likely need to refer to EP for consideration of EP study/ablation. Recommend to try to get records of the rhythm strip. Recommend to obtain official report of stress test.  Current medicines are reviewed at length with the patient today.  The patient does not have concerns regarding medicines.  Labs/ tests ordered today include:  Orders Placed This Encounter  Procedures  . EKG 12-Lead    I had a lengthy and detailed discussion with the patient regarding diagnoses, prognosis, diagnostic options, treatment options , and side effects of medications.   I counseled the patient on importance of lifestyle modification including heart healthy diet, regular physical activity .   Disposition:   FU with undersigned in 2 months Signed, Almond LintAileen Azavion Bouillon, MD  01/18/2016 2:59 PM    [facility]

## 2016-01-18 NOTE — Patient Instructions (Addendum)
Medication Instructions:  Your physician recommends that you continue on your current medications as directed. Please refer to the Current Medication list given to you today.   Labwork: None ordered  Testing/Procedures: None ordered  Follow-Up: Your physician recommends that you schedule a follow-up appointment in: 2 months with Dr. Alvino ChapelIngal.  Date & Time:____________________________________________________    Any Other Special Instructions Will Be Listed Below (If Applicable).  Please call if you have any further fast heart beats or report to emergency room. Try to get a copy of the documentation of fast heart beats.    If you need a refill on your cardiac medications before your next appointment, please call your pharmacy.

## 2016-02-14 ENCOUNTER — Other Ambulatory Visit: Payer: Self-pay | Admitting: *Deleted

## 2016-02-14 ENCOUNTER — Other Ambulatory Visit: Payer: Self-pay

## 2016-02-14 MED ORDER — METOPROLOL TARTRATE 25 MG PO TABS: 25.0000 mg | ORAL_TABLET | Freq: Two times a day (BID) | ORAL | Status: DC

## 2016-03-20 ENCOUNTER — Encounter: Payer: Self-pay | Admitting: Cardiology

## 2016-03-20 ENCOUNTER — Ambulatory Visit (INDEPENDENT_AMBULATORY_CARE_PROVIDER_SITE_OTHER): Payer: 59 | Admitting: Cardiology

## 2016-03-20 VITALS — BP 100/62 | HR 59 | Ht 66.0 in | Wt 152.8 lb

## 2016-03-20 DIAGNOSIS — R002 Palpitations: Secondary | ICD-10-CM | POA: Diagnosis not present

## 2016-03-20 DIAGNOSIS — I471 Supraventricular tachycardia: Secondary | ICD-10-CM

## 2016-03-20 NOTE — Progress Notes (Signed)
Cardiology Office Note   Date:  03/20/2016   ID:  Tammy Acevedo, DOB 1970/11/27, MRN [id number]  Referring Doctor:  Bari Edward, MD   Cardiologist:   Almond Lint, MD   Reason for consultation:  Chief Complaint  Patient presents with  . other    2 month f/u no complaints. Meds reviewed verbally.      History of Present Illness: Tammy Acevedo is a 45 y.o. female who presents forFollow-up for SVT. Since she has been taking the metoprolol, she hasn't had any more episodes of palpitations.  Patient denies chest pain, shortness breath, loss of consciousness, headache, fever, cough, colds, abdominal pain, orthopnea, PND, edema.  ROS:  Please see the history of present illness. Aside from mentioned under HPI, all other systems are reviewed and negative.    History reviewed. No pertinent past medical history. Indigestion for which she takes omeprazole  Past Surgical History  Procedure Laterality Date  . Cholecystectomy    . Carpal tunnel release    . Tubal ligation       reports that she has never smoked. She has never used smokeless tobacco. She reports that she does not drink alcohol or use illicit drugs.   family history includes Breast cancer in her maternal aunt and paternal aunt; COPD in her father; Diabetes in her mother.   Current Outpatient Prescriptions  Medication Sig Dispense Refill  . metoprolol tartrate (LOPRESSOR) 25 MG tablet Take 1 tablet (25 mg total) by mouth 2 (two) times daily. 60 tablet 3   No current facility-administered medications for this visit.    Allergies: Review of patient's allergies indicates no known allergies.    PHYSICAL EXAM: VS:  BP 100/62 mmHg  Pulse 59  Ht  (1.676 m)  Wt 152 lb 12 oz (69.287 kg)  BMI 24.67 kg/m2 , Body mass index is 24.67 kg/(m^2). Wt Readings from Last 3 Encounters:  03/20/16 152 lb 12 oz (69.287 kg)  01/18/16 152 lb 4 oz (69.06 kg)  07/13/15 149 lb 6.4 oz (67.767 kg)    GENERAL:   well developed, well nourished, not in acute distress HEENT: normocephalic, pink conjunctivae, anicteric sclerae, no xanthelasma, normal dentition, oropharynx clear NECK:  no neck vein engorgement, JVP normal, no hepatojugular reflux, carotid upstroke brisk and symmetric, no bruit, no thyromegaly, no lymphadenopathy LUNGS:  good respiratory effort, clear to auscultation bilaterally CV:  PMI not displaced, no thrills, no lifts, S1 and S2 within normal limits, no palpable S3 or S4, no murmurs, no rubs, no gallops ABD:  Soft, nontender, nondistended, normoactive bowel sounds, no abdominal aortic bruit, no hepatomegaly, no splenomegaly MS: nontender back, no kyphosis, no scoliosis, no joint deformities EXT:  2+ DP/PT pulses, no edema, no varicosities, no cyanosis, no clubbing SKIN: warm, nondiaphoretic, normal turgor, no ulcers NEUROPSYCH: alert, oriented to person, place, and time, sensory/motor grossly intact, normal mood, appropriate affect  Recent Labs: 07/13/2015: ALT 8; BUN 9; Creatinine, Ser 0.79; Platelets 245; Potassium 4.3; Sodium 146*; TSH 2.420   Lipid Panel    Component Value Date/Time   CHOL 146 12/15/2012 0857   TRIG 72.0 12/15/2012 0857   HDL 38.90* 12/15/2012 0857   CHOLHDL 4 12/15/2012 0857   VLDL 14.4 12/15/2012 0857   LDLCALC 93 12/15/2012 0857     Other studies Reviewed:  EKG:  The ekg from 01/18/2016 was personally reviewed by me and it revealed sinus rhythm. PR 104 ms. Early transition. EKG from 03/20/2016 was personally reviewed by me  and it revealed sinus bradycardia, 59 BPM. PR interval is 110 ms. Early transition. No significant change from EKG from 01/18/2016.  Additional studies/ records that were reviewed personally reviewed by me today include:  [facility]ORMAL LEFT VENTRICULAR SYSTOLIC FUNCTION WITH AN ESTIMATED EF = 50 % NORMAL RIGHT VENTRICULAR SYSTOLIC FUNCTION MILD VALVULAR REGURGITATION (See above) mitral  NO  VALVULAR STENOSIS  Exercise tolerance test 08/05/2015, Dr. Juliann [place]: Normal exercise stress test  ASSESSMENT AND PLAN:  Palpitations Probably SVT, but no available rhythm strips for review [facility], Dr. [place] Echo unremarkable. Exercise tolerance test was negative. Recommend watchful waiting. Continue metoprolol for now. If another episode of possible SVT occurs despite being on a beta blocker, will likely need to refer to EP for consideration of EP study/ablation. Recommend to try to get records of the rhythm strip. Recommend to obtain official report of stress test.  Current medicines are reviewed at length with the patient today.  The patient does not have concerns regarding medicines.  Labs/ tests ordered today include:  Orders Placed This Encounter  Procedures  . EKG 12-Lead    I had a lengthy and detailed discussion with the patient regarding diagnoses, prognosis, diagnostic options, treatment options , and side effects of medications.   I counseled the patient on importance of lifestyle modification including heart healthy diet, regular physical activity .   Disposition:   FU with undersigned in 6 months   Signed, Almond LintAileen Davaun Quintela, MD  03/20/2016 9:45 AM     [facility]

## 2016-03-20 NOTE — Patient Instructions (Signed)
Medication Instructions:  Your physician recommends that you continue on your current medications as directed. Please refer to the Current Medication list given to you today.   Labwork: None ordered  Testing/Procedures: None ordered  Follow-Up: Your physician wants you to follow-up in: 6 months with Dr. Alvino ChapelIngal. You will receive a reminder letter in the mail two months in advance. If you don't receive a letter, please call our office to schedule the follow-up appointment.   It was a pleasure seeing you today here in the office. Please do not hesitate to give us a call back if you have any further questions. [phone]   CellarPamela A. RN, BSN

## 2016-04-25 ENCOUNTER — Other Ambulatory Visit: Payer: Self-pay | Admitting: Internal Medicine

## 2016-04-25 DIAGNOSIS — Z1231 Encounter for screening mammogram for malignant neoplasm of breast: Secondary | ICD-10-CM

## 2016-05-10 ENCOUNTER — Ambulatory Visit
Admission: RE | Admit: 2016-05-10 | Discharge: 2016-05-10 | Disposition: A | Discharge status: Home / Self Care | Payer: 59 | Source: Ambulatory Visit | Attending: Internal Medicine | Admitting: Internal Medicine

## 2016-05-10 ENCOUNTER — Other Ambulatory Visit: Payer: Self-pay | Admitting: Internal Medicine

## 2016-05-10 DIAGNOSIS — Z1231 Encounter for screening mammogram for malignant neoplasm of breast: Secondary | ICD-10-CM

## 2016-09-28 ENCOUNTER — Encounter: Payer: Self-pay | Admitting: Cardiology

## 2016-09-28 ENCOUNTER — Ambulatory Visit (INDEPENDENT_AMBULATORY_CARE_PROVIDER_SITE_OTHER): Payer: 59 | Admitting: Cardiology

## 2016-09-28 VITALS — BP 114/70 | HR 63 | Ht 66.0 in | Wt 155.5 lb

## 2016-09-28 DIAGNOSIS — R002 Palpitations: Secondary | ICD-10-CM | POA: Diagnosis not present

## 2016-09-28 NOTE — Progress Notes (Signed)
Cardiology Office Note   Date:  09/28/2016   ID:  Tammy Acevedo, DOB June 18, 1971, MRN [id number]  Referring Doctor:  Bari EdwardLaura Berglund, MD   Cardiologist:   Almond LintAileen Kandas Oliveto, MD   Reason for consultation:  Chief Complaint  Patient presents with  . other    6 month f/u. Pt states she is doing well. Reviewed meds with pt verbally.      History of Present Illness: Tammy Acevedo is a 45 y.o. female who presents forFollow-up for SVT.   Patient continues to take metoprolol, sometimes she forgets to take it twice a day. She at least takes it once a day. She always brings the medications with her. She has had no episodes of palpitations again.  Patient denies chest pain, shortness breath, loss of consciousness, headache, fever, cough, colds, abdominal pain, orthopnea, PND, edema.  ROS:  Please see the history of present illness. Aside from mentioned under HPI, all other systems are reviewed and negative.    History reviewed. No pertinent past medical history. Indigestion for which she takes omeprazole  Past Surgical History:  Procedure Laterality Date  . CARPAL TUNNEL RELEASE    . CHOLECYSTECTOMY    . TUBAL LIGATION       reports that she has never smoked. She has never used smokeless tobacco. She reports that she does not drink alcohol or use drugs.   family history includes Breast cancer in her maternal aunt and paternal aunt; COPD in her father; Diabetes in her mother.   Current Outpatient Prescriptions  Medication Sig Dispense Refill  . metoprolol tartrate (LOPRESSOR) 25 MG tablet Take 1 tablet (25 mg total) by mouth 2 (two) times daily. 60 tablet 3   No current facility-administered medications for this visit.     Allergies: Patient has no known allergies.    PHYSICAL EXAM: VS:  BP 114/70 (BP Location: Left Arm, Patient Position: Sitting, Cuff Size: Normal)   Pulse 63   Ht 5\' 6"  (1.676 m)   Wt 155 lb 8 oz (70.5 kg)   BMI 25.10 kg/m  , Body mass  index is 25.1 kg/m. Wt Readings from Last 3 Encounters:  09/28/16 155 lb 8 oz (70.5 kg)  03/20/16 152 lb 12 oz (69.3 kg)  01/18/16 152 lb 4 oz (69.1 kg)    GENERAL:  well developed, well nourished, not in acute distress HEENT: normocephalic, pink conjunctivae, anicteric sclerae, no xanthelasma, normal dentition, oropharynx clear NECK:  no neck vein engorgement, JVP normal, no hepatojugular reflux, carotid upstroke brisk and symmetric, no bruit, no thyromegaly, no lymphadenopathy LUNGS:  good respiratory effort, clear to auscultation bilaterally CV:  PMI not displaced, no thrills, no lifts, S1 and S2 within normal limits, no palpable S3 or S4, no murmurs, no rubs, no gallops ABD:  Soft, nontender, nondistended, normoactive bowel sounds, no abdominal aortic bruit, no hepatomegaly, no splenomegaly MS: nontender back, no kyphosis, no scoliosis, no joint deformities EXT:  2+ DP/PT pulses, no edema, no varicosities, no cyanosis, no clubbing SKIN: warm, nondiaphoretic, normal turgor, no ulcers NEUROPSYCH: alert, oriented to person, place, and time, sensory/motor grossly intact, normal mood, appropriate affect  Recent Labs: No results found for requested labs within last 8760 hours.   Lipid Panel    Component Value Date/Time   CHOL 146 12/15/2012 0857   TRIG 72.0 12/15/2012 0857   HDL 38.90 (L) 12/15/2012 0857   CHOLHDL 4 12/15/2012 0857   VLDL 14.4 12/15/2012 0857   LDLCALC 93 12/15/2012 0857  Other studies Reviewed:  EKG:  The ekg from 01/18/2016 was personally reviewed by me and it revealed sinus rhythm. PR 104 ms. Early transition. EKG from 03/20/2016 was personally reviewed by me and it revealed sinus bradycardia, 59 BPM. PR interval is 110 ms. Early transition. No significant change from EKG from 01/18/2016. EKG from 09/28/2016 was personally reviewed by me and shows SR, 63.   Additional studies/ records that were reviewed personally reviewed by me today include:    [facility] NORMAL LEFT VENTRICULAR SYSTOLIC FUNCTION WITH AN ESTIMATED EF = 50 % NORMAL RIGHT VENTRICULAR SYSTOLIC FUNCTION MILD VALVULAR REGURGITATION (See above) mitral  NO VALVULAR STENOSIS  Exercise tolerance test 08/05/2015, Dr. Juliann Paresallwood: Normal exercise stress test  ASSESSMENT AND PLAN:  Palpitations Probably SVT, but no available rhythm strips for review [facility], Dr. Juliann [place] Echo unremarkable. Exercise tolerance test was negative. Patient has been asymptomatic.Continue metoprolol for now. If another episode of possible SVT occurs despite being on a beta blocker, will likely need to refer to EP for consideration of EP study/ablation. Recommend to try to get records of the rhythm strip. Recommend to obtain official report of stress test.  Current medicines are reviewed at length with the patient today.  The patient does not have concerns regarding medicines.  Labs/ tests ordered today include:  Orders Placed This Encounter  Procedures  . EKG 12-Lead    I had a lengthy and detailed discussion with the patient regarding diagnoses, prognosis, diagnostic options, treatment options , and side effects of medications.   I counseled the patient on importance of lifestyle modification including heart healthy diet, regular physical activity .   Disposition:   FU with undersigned in 9 months to one year  Signed, Almond LintAileen Clare Fennimore, MD  09/28/2016 9:39 AM    [facility]

## 2016-09-28 NOTE — Patient Instructions (Signed)
Follow-Up: Your physician wants you to follow-up in: 9-12 months with Dr. Alvino ChapelIngal. You will receive a reminder letter in the mail two months in advance. If you don't receive a letter, please call our office to schedule the follow-up appointment.  It was a pleasure seeing you today here in the office. Please do not hesitate to give us a call back if you have any further questions. [phone]  Wanaque CellarPamela A. RN, BSN

## 2016-10-28 ENCOUNTER — Other Ambulatory Visit: Payer: Self-pay | Admitting: Cardiology

## 2016-11-01 ENCOUNTER — Other Ambulatory Visit: Payer: Self-pay

## 2016-11-01 MED ORDER — METOPROLOL TARTRATE 25 MG PO TABS: 25.0000 mg | ORAL_TABLET | Freq: Two times a day (BID) | ORAL | 3 refills | Status: DC

## 2017-04-04 ENCOUNTER — Encounter: Payer: 59 | Admitting: Internal Medicine

## 2017-04-04 ENCOUNTER — Encounter: Payer: Self-pay | Admitting: Internal Medicine

## 2017-04-04 DIAGNOSIS — I471 Supraventricular tachycardia, unspecified: Secondary | ICD-10-CM | POA: Insufficient documentation

## 2017-04-04 HISTORY — DX: Supraventricular tachycardia: I47.1

## 2017-05-22 DIAGNOSIS — Z1231 Encounter for screening mammogram for malignant neoplasm of breast: Secondary | ICD-10-CM | POA: Diagnosis not present

## 2017-05-22 DIAGNOSIS — Z01419 Encounter for gynecological examination (general) (routine) without abnormal findings: Secondary | ICD-10-CM | POA: Diagnosis not present

## 2017-05-22 LAB — RESULTS CONSOLE HPV: CHL HPV: NEGATIVE

## 2017-05-22 LAB — HM PAP SMEAR: HM Pap smear: NORMAL

## 2017-05-23 ENCOUNTER — Other Ambulatory Visit: Payer: Self-pay | Admitting: Obstetrics and Gynecology

## 2017-05-23 DIAGNOSIS — Z1231 Encounter for screening mammogram for malignant neoplasm of breast: Secondary | ICD-10-CM

## 2017-06-05 ENCOUNTER — Ambulatory Visit
Admission: RE | Admit: 2017-06-05 | Discharge: 2017-06-05 | Disposition: A | Discharge status: Home / Self Care | Payer: 59 | Source: Ambulatory Visit | Attending: Obstetrics and Gynecology | Admitting: Obstetrics and Gynecology

## 2017-06-05 DIAGNOSIS — Z1231 Encounter for screening mammogram for malignant neoplasm of breast: Secondary | ICD-10-CM | POA: Insufficient documentation

## 2017-09-23 ENCOUNTER — Encounter: Payer: 59 | Admitting: Internal Medicine

## 2017-09-25 ENCOUNTER — Encounter: Payer: Self-pay | Admitting: Internal Medicine

## 2017-09-25 ENCOUNTER — Ambulatory Visit (INDEPENDENT_AMBULATORY_CARE_PROVIDER_SITE_OTHER): Payer: 59 | Admitting: Internal Medicine

## 2017-09-25 VITALS — BP 124/78 | HR 77 | Ht 66.0 in | Wt 152.0 lb

## 2017-09-25 DIAGNOSIS — I471 Supraventricular tachycardia: Secondary | ICD-10-CM

## 2017-09-25 DIAGNOSIS — Z23 Encounter for immunization: Secondary | ICD-10-CM

## 2017-09-25 DIAGNOSIS — Z0000 Encounter for general adult medical examination without abnormal findings: Secondary | ICD-10-CM

## 2017-09-25 LAB — POC URINALYSIS WITH MICROSCOPIC (NON AUTO)MANUAL RESULT
Bilirubin, UA: NEGATIVE
Crystals: 0
GLUCOSE UA: NEGATIVE
Ketones, UA: NEGATIVE
MUCUS UA: 0
NITRITE UA: NEGATIVE
RBC: 2 M/uL — AB (ref 4.04–5.48)
Spec Grav, UA: 1.015 (ref 1.010–1.025)
Urobilinogen, UA: 0.2 E.U./dL
WBC Casts, UA: 0
pH, UA: 6 (ref 5.0–8.0)

## 2017-09-25 NOTE — Patient Instructions (Signed)

## 2017-09-25 NOTE — Progress Notes (Signed)
Date:  09/25/2017   Name:  Tammy Acevedo   DOB:  01-14-1971   MRN:  [id number]   Chief Complaint: Annual Exam Tammy Acevedo is a 46 y.o. female who presents today for her Complete Annual Exam. She feels well. She reports exercising none. She reports she is sleeping well.  Recently had GYN exam with breast exam and Pap smear.  Mammogram 06/2017 was normal.  SVT - no episodes, maintained on daily beta blocker.  Followed by cardiology.   Review of Systems  Constitutional: Negative for chills, fatigue and fever.  HENT: Negative for congestion, hearing loss, tinnitus, trouble swallowing and voice change.   Eyes: Negative for visual disturbance.  Respiratory: Negative for cough, chest tightness, shortness of breath and wheezing.   Cardiovascular: Negative for chest pain, palpitations and leg swelling.  Gastrointestinal: Negative for abdominal pain, constipation, diarrhea and vomiting.  Endocrine: Negative for polydipsia and polyuria.  Genitourinary: Negative for dysuria, frequency, genital sores, menstrual problem, vaginal bleeding and vaginal discharge.  Musculoskeletal: Negative for arthralgias, gait problem and joint swelling.  Skin: Negative for color change and rash.  Neurological: Negative for dizziness, tremors, light-headedness and headaches.  Hematological: Negative for adenopathy. Does not bruise/bleed easily.  Psychiatric/Behavioral: Negative for dysphoric mood and sleep disturbance. The patient is not nervous/anxious.     Patient Active Problem List   Diagnosis Date Noted  . SVT (supraventricular tachycardia) (HCC) 04/04/2017    Prior to Admission medications   Medication Sig Start Date End Date Taking? Authorizing Provider  metoprolol tartrate (LOPRESSOR) 25 MG tablet Take 1 tablet (25 mg total) by mouth 2 (two) times daily. 11/01/16   Tammy LintIngal, Aileen, MD    No Known Allergies  Past Surgical History:  Procedure Laterality Date  . CARPAL TUNNEL RELEASE     . CHOLECYSTECTOMY    . TUBAL LIGATION      Social History   Tobacco Use  . Smoking status: Never Smoker  . Smokeless tobacco: Never Used  Substance Use Topics  . Alcohol use: No    Alcohol/week: 0.0 oz  . Drug use: No     Medication list has been reviewed and updated.  PHQ 2/9 Scores 09/25/2017  PHQ - 2 Score 0    Physical Exam  Constitutional: She is oriented to person, place, and time. She appears well-developed and well-nourished. No distress.  HENT:  Head: Normocephalic and atraumatic.  Right Ear: Tympanic membrane and ear canal normal.  Left Ear: Tympanic membrane and ear canal normal.  Nose: Right sinus exhibits no maxillary sinus tenderness. Left sinus exhibits no maxillary sinus tenderness.  Mouth/Throat: Uvula is midline and oropharynx is clear and moist.  Eyes: Conjunctivae and EOM are normal. Right eye exhibits no discharge. Left eye exhibits no discharge. No scleral icterus.  Neck: Normal range of motion. Carotid bruit is not present. No erythema present. No thyromegaly present.  Cardiovascular: Normal rate, regular rhythm, normal heart sounds and normal pulses.  Pulmonary/Chest: Effort normal. No respiratory distress. She has no wheezes.  Abdominal: Soft. Bowel sounds are normal. There is no hepatosplenomegaly. There is no tenderness. There is no CVA tenderness.  Musculoskeletal: Normal range of motion. She exhibits no edema or tenderness.  Lymphadenopathy:    She has no cervical adenopathy.    She has no axillary adenopathy.  Neurological: She is alert and oriented to person, place, and time. She has normal reflexes. No cranial nerve deficit or sensory deficit.  Skin: Skin is warm, dry and intact. No  rash noted.  Psychiatric: She has a normal mood and affect. Her speech is normal and behavior is normal. Thought content normal.  Nursing note and vitals reviewed.   BP 124/78   Pulse 77   Ht 5\' 6"  (1.676 m)   Wt 152 lb (68.9 kg)   LMP 08/29/2017 (Exact  Date)   SpO2 98%   BMI 24.53 kg/m   Assessment and Plan: 1. Annual physical exam Normal exam Recommend regular exercise Follow with GYN for pap and mammograms - POC urinalysis w microscopic (non auto) - Lipid panel  2. SVT (supraventricular tachycardia) (HCC) Controlled; followed by Cardiology - CBC with Differential/Platelet - Comprehensive metabolic panel - TSH  3. Need for influenza vaccination - Flu Vaccine QUAD 36+ mos IM   No orders of the defined types were placed in this encounter.   Partially dictated using Animal nutritionistDragon software. Any errors are unintentional.  Tammy EdwardLaura Darnelle Derrick, MD [facility]  09/25/2017

## 2017-09-26 LAB — COMPREHENSIVE METABOLIC PANEL
ALK PHOS: 79 IU/L (ref 39–117)
ALT: 16 IU/L (ref 0–32)
AST: 12 IU/L (ref 0–40)
Albumin/Globulin Ratio: 1.8 (ref 1.2–2.2)
Albumin: 4.3 g/dL (ref 3.5–5.5)
BUN / CREAT RATIO: 10 (ref 9–23)
BUN: 7 mg/dL (ref 6–24)
Bilirubin Total: 0.4 mg/dL (ref 0.0–1.2)
CALCIUM: 8.9 mg/dL (ref 8.7–10.2)
CO2: 24 mmol/L (ref 20–29)
CREATININE: 0.7 mg/dL (ref 0.57–1.00)
Chloride: 103 mmol/L (ref 96–106)
GFR calc Af Amer: 118 mL/min/{1.73_m2} (ref >59)
GFR, EST NON AFRICAN AMERICAN: 103 mL/min/{1.73_m2} (ref >59)
GLUCOSE: 75 mg/dL (ref 65–99)
Globulin, Total: 2.4 g/dL (ref 1.5–4.5)
Potassium: 4 mmol/L (ref 3.5–5.2)
Sodium: 144 mmol/L (ref 134–144)
Total Protein: 6.7 g/dL (ref 6.0–8.5)

## 2017-09-26 LAB — CBC WITH DIFFERENTIAL/PLATELET
BASOS ABS: 0 10*3/uL (ref 0.0–0.2)
Basos: 0 %
EOS (ABSOLUTE): 0.1 10*3/uL (ref 0.0–0.4)
EOS: 2 %
Hematocrit: 38.3 % (ref 34.0–46.6)
Hemoglobin: 12.8 g/dL (ref 11.1–15.9)
IMMATURE GRANULOCYTES: 0 %
Immature Grans (Abs): 0 10*3/uL (ref 0.0–0.1)
LYMPHS: 32 %
Lymphocytes Absolute: 2 10*3/uL (ref 0.7–3.1)
MCH: 28.6 pg (ref 26.6–33.0)
MCHC: 33.4 g/dL (ref 31.5–35.7)
MCV: 86 fL (ref 79–97)
MONOCYTES: 6 %
Monocytes Absolute: 0.4 10*3/uL (ref 0.1–0.9)
NEUTROS PCT: 60 %
Neutrophils Absolute: 3.9 10*3/uL (ref 1.4–7.0)
Platelets: 244 10*3/uL (ref 150–379)
RBC: 4.48 x10E6/uL (ref 3.77–5.28)
RDW: 14.5 % (ref 12.3–15.4)
WBC: 6.5 10*3/uL (ref 3.4–10.8)

## 2017-09-26 LAB — LIPID PANEL
CHOL/HDL RATIO: 3.6 ratio (ref 0.0–4.4)
CHOLESTEROL TOTAL: 164 mg/dL (ref 100–199)
HDL: 45 mg/dL (ref >39)
LDL CALC: 97 mg/dL (ref 0–99)
Triglycerides: 112 mg/dL (ref 0–149)
VLDL CHOLESTEROL CAL: 22 mg/dL (ref 5–40)

## 2017-09-26 LAB — TSH: TSH: 1.74 u[IU]/mL (ref 0.450–4.500)

## 2017-12-03 ENCOUNTER — Other Ambulatory Visit: Payer: Self-pay

## 2017-12-03 MED ORDER — METOPROLOL TARTRATE 25 MG PO TABS: 25.0000 mg | ORAL_TABLET | Freq: Two times a day (BID) | ORAL | 0 refills | Status: DC

## 2017-12-14 NOTE — Progress Notes (Signed)
Cardiology Office Note  Date:  12/17/2017   ID:  Tammy Acevedo, DOB 11-Mar-1971, MRN [id number]  PCP:  Reubin Milan, MD   Chief Complaint  Patient presents with  . Other    12 month follow up. Patient c/o chest tightness at times. former Ingal Patient. Meds reviewed verbally with patient.     HPI:  Tammy Acevedo is a 47 y.o. female with PMH of  SVT last  approximately 2 years ago Was coming on every 3 to 6 month, better on metoprolol Who presents for routine follow-up of her SVT  Only taking metoprolol once a day in the morning Sometimes feel she is going to have an episode of SVT but it never materializes Did not take medication this morning blood pressure 108/60 heart rate 62 Overall feels well  Having worsening GERD symptoms, daily Taking omeprazole occasionally with Tums   Patient denies chest pain, shortness breath, loss of consciousness, headache, fever, cough, colds, abdominal pain, orthopnea, PND, edema.  EKG personally reviewed by myself on todays visit Shows normal sinus rhythm rate 62 bpm with no significant ST or T wave changes  Other past medical history reviewed [facility] NORMAL LEFT VENTRICULAR SYSTOLIC FUNCTION WITH AN ESTIMATED EF = 50 % NORMAL RIGHT VENTRICULAR SYSTOLIC FUNCTION MILD VALVULAR REGURGITATION (See above) mitral  NO VALVULAR STENOSIS  Exercise tolerance test 08/05/2015, Dr. Juliann Pares: Normal exercise stress test   PMH:   SVT GERD  PSH:    Past Surgical History:  Procedure Laterality Date  . CARPAL TUNNEL RELEASE    . CHOLECYSTECTOMY    . TUBAL LIGATION      Current Outpatient Medications  Medication Sig Dispense Refill  . metoprolol succinate (TOPROL XL) 25 MG 24 hr tablet Take 1 tablet (25 mg total) by mouth daily. 30 tablet 11   No current facility-administered medications for this visit.      Allergies:   Patient has no known allergies.   Social History:  The  patient  reports that  has never smoked. she has never used smokeless tobacco. She reports that she does not drink alcohol or use drugs.   Family History:   family history includes Breast cancer in her maternal aunt and paternal aunt; COPD in her father; Diabetes in her mother.    Review of Systems: Review of Systems  Constitutional: Negative.   Respiratory: Negative.   Cardiovascular: Negative.   Gastrointestinal: Negative.   Musculoskeletal: Negative.   Neurological: Negative.   Psychiatric/Behavioral: Negative.   All other systems reviewed and are negative.    PHYSICAL EXAM: VS:  BP 108/60 (BP Location: Left Arm, Patient Position: Sitting, Cuff Size: Normal)   Pulse 62   Ht 5\' 6"  (1.676 m)   Wt 153 lb (69.4 kg)   BMI 24.69 kg/m  , BMI Body mass index is 24.69 kg/m. GEN: Well nourished, well developed, in no acute distress  HEENT: normal  Neck: no JVD, carotid bruits, or masses Cardiac: RRR; no murmurs, rubs, or gallops,no edema  Respiratory:  clear to auscultation bilaterally, normal work of breathing GI: soft, nontender, nondistended, + BS MS: no deformity or atrophy  Skin: warm and dry, no rash Neuro:  Strength and sensation are intact Psych: euthymic mood, full affect    Recent Labs: 09/25/2017: ALT 16; BUN 7; Creatinine, Ser 0.70; Hemoglobin 12.8; Platelets 244; Potassium 4.0; Sodium 144; TSH 1.740    Lipid Panel Lab Results  Component Value Date   CHOL 164 09/25/2017  HDL 45 09/25/2017   LDLCALC 97 09/25/2017   TRIG 112 09/25/2017      Wt Readings from Last 3 Encounters:  12/17/17 153 lb (69.4 kg)  09/25/17 152 lb (68.9 kg)  09/28/16 155 lb 8 oz (70.5 kg)       ASSESSMENT AND PLAN:  SVT (supraventricular tachycardia) (HCC) - Plan: EKG 12-Lead Denies having any episodes for the past 2 years Recommended she stop the metoprolol tartrate and start metoprolol succinate 25 mg daily She was only taking metoprolol tartrate once a  day  Gastroesophageal reflux disease without esophagitis Recommend she take Pepcid and Tums as needed If symptoms persist would discuss with primary care  Anxiety Anxious on today's visit, she was upset  I was late for her appointment (20 min) Apologized for being late Recommended she schedule first thing in the morning or first thing after lunch if she needed a prompt visit  Disposition:   F/U  12 months   Total encounter time more than 25 minutes  Greater than 50% was spent in counseling and coordination of care with the patient    Orders Placed This Encounter  Procedures  . EKG 12-Lead     Signed, Dossie Arbourim Caroly Purewal, M.D., Ph.D. 12/17/2017  [facility] [place] [phone]

## 2017-12-17 ENCOUNTER — Ambulatory Visit: Payer: 59 | Admitting: Cardiovascular Disease

## 2017-12-17 ENCOUNTER — Encounter: Payer: Self-pay | Admitting: Cardiovascular Disease

## 2017-12-17 VITALS — BP 108/60 | HR 62 | Ht 66.0 in | Wt 153.0 lb

## 2017-12-17 DIAGNOSIS — I471 Supraventricular tachycardia: Secondary | ICD-10-CM | POA: Diagnosis not present

## 2017-12-17 DIAGNOSIS — K219 Gastro-esophageal reflux disease without esophagitis: Secondary | ICD-10-CM | POA: Diagnosis not present

## 2017-12-17 DIAGNOSIS — F419 Anxiety disorder, unspecified: Secondary | ICD-10-CM | POA: Diagnosis not present

## 2017-12-17 MED ORDER — METOPROLOL SUCCINATE ER 25 MG PO TB24: 25.0000 mg | ORAL_TABLET | Freq: Every day | ORAL | 11 refills | Status: DC

## 2017-12-17 NOTE — Patient Instructions (Addendum)
Medication Instructions:   We will change the metoprolol tartrate to metoprolol succinate 25 mg daily  Try generic of the pepcid or zantac Ranitidine or famotidine Take in the AM and PM Take as needed Ok to take with Tums  Labwork:  No new labs needed  Testing/Procedures:  No further testing at this time   Follow-Up: It was a pleasure seeing you in the office today. Please call us if you have new issues that need to be addressed before your next appt.  [phone]  Your physician wants you to follow-up in: 12 months as needed  You will receive a reminder letter in the mail two months in advance. If you don't receive a letter, please call our office to schedule the follow-up appointment.  If you need a refill on your cardiac medications before your next appointment, please call your pharmacy.  For educational health videos Log in to : [email or web address] Or : [email or web address], password : [email or web address]

## 2018-03-08 ENCOUNTER — Other Ambulatory Visit: Payer: Self-pay | Admitting: Cardiovascular Disease

## 2018-03-12 ENCOUNTER — Other Ambulatory Visit: Payer: Self-pay | Admitting: Cardiovascular Disease

## 2018-05-01 ENCOUNTER — Ambulatory Visit: Payer: 59 | Admitting: Internal Medicine

## 2018-05-01 ENCOUNTER — Encounter: Payer: Self-pay | Admitting: Internal Medicine

## 2018-05-01 VITALS — BP 124/68 | HR 70 | Temp 98.3°F | Ht 66.0 in | Wt 148.0 lb

## 2018-05-01 DIAGNOSIS — J069 Acute upper respiratory infection, unspecified: Secondary | ICD-10-CM | POA: Diagnosis not present

## 2018-05-01 NOTE — Patient Instructions (Signed)
Loratidine (generic Claritin) 10 mg - one a day   Or Generic Zyrtec 10 mg or Allegra 180 mg

## 2018-05-01 NOTE — Progress Notes (Signed)
    Date:  05/01/2018   Name:  Tammy PrinceDiamond Murray Acevedo   DOB:  1971-08-27   MRN:  [id number]   Chief Complaint: Nasal Congestion (Started Monday with scratchy throat. Tuesday sore throat and couldnt swallow without pain. Congestion and right sided face pressure and congestion. Low grade fever. Nose running.) Sore Throat   This is a new problem. The current episode started in the past 7 days. The problem has been gradually improving. Neither side of throat is experiencing more pain than the other. There has been no fever. The pain is moderate. Pertinent negatives include no congestion, coughing or shortness of breath.    Review of Systems  Constitutional: Negative for chills, fatigue and fever.  HENT: Positive for postnasal drip and sore throat. Negative for congestion, hearing loss and sinus pain.   Respiratory: Negative for cough, chest tightness, shortness of breath and wheezing.   Cardiovascular: Negative for chest pain and palpitations.    Patient Active Problem List   Diagnosis Date Noted  . GERD (gastroesophageal reflux disease) 12/17/2017  . Anxiety 12/17/2017  . SVT (supraventricular tachycardia) (HCC) 04/04/2017    No Known Allergies  Past Surgical History:  Procedure Laterality Date  . CARPAL TUNNEL RELEASE    . CHOLECYSTECTOMY    . TUBAL LIGATION      Social History   Tobacco Use  . Smoking status: Never Smoker  . Smokeless tobacco: Never Used  Substance Use Topics  . Alcohol use: No    Alcohol/week: 0.0 oz  . Drug use: No     Medication list has been reviewed and updated.  Current Meds  Medication Sig  . metoprolol succinate (TOPROL XL) 25 MG 24 hr tablet Take 1 tablet (25 mg total) by mouth daily.    PHQ 2/9 Scores 09/25/2017  PHQ - 2 Score 0    Physical Exam  Constitutional: She is oriented to person, place, and time. She appears well-developed. No distress.  HENT:  Head: Normocephalic and atraumatic.  Right Ear: Tympanic membrane and ear canal  normal.  Left Ear: Tympanic membrane and ear canal normal.  Nose: Right sinus exhibits no maxillary sinus tenderness and no frontal sinus tenderness. Left sinus exhibits no maxillary sinus tenderness and no frontal sinus tenderness.  Mouth/Throat: Posterior oropharyngeal erythema present. No oropharyngeal exudate or posterior oropharyngeal edema.  Neck: No thyromegaly present.  Cardiovascular: Normal rate, regular rhythm and normal heart sounds.  Pulmonary/Chest: Effort normal and breath sounds normal. No respiratory distress.  Musculoskeletal: Normal range of motion.  Lymphadenopathy:    She has no cervical adenopathy.  Neurological: She is alert and oriented to person, place, and time.  Skin: Skin is warm and dry. No rash noted.  Psychiatric: She has a normal mood and affect. Her behavior is normal. Thought content normal.  Nursing note and vitals reviewed.   BP 124/68   Pulse 70   Temp 98.3 F (36.8 C) (Oral)   Ht 5\' 6"  (1.676 m)   Wt 148 lb (67.1 kg)   LMP 04/29/2018 (Exact Date)   SpO2 97%   BMI 23.89 kg/m   Assessment and Plan: 1. Viral upper respiratory tract infection Continue Tylenol cold Add Claritin or Zyrtec for drainage/PND No indication for antibiotics    No orders of the defined types were placed in this encounter.   Partially dictated using Animal nutritionistDragon software. Any errors are unintentional.  Bari EdwardLaura Berglund, MD [facility]  05/01/2018

## 2018-05-14 ENCOUNTER — Other Ambulatory Visit: Payer: Self-pay | Admitting: Cardiovascular Disease

## 2018-05-28 DIAGNOSIS — Z01419 Encounter for gynecological examination (general) (routine) without abnormal findings: Secondary | ICD-10-CM | POA: Diagnosis not present

## 2018-05-28 DIAGNOSIS — Z1231 Encounter for screening mammogram for malignant neoplasm of breast: Secondary | ICD-10-CM | POA: Diagnosis not present

## 2018-06-03 ENCOUNTER — Other Ambulatory Visit: Payer: Self-pay | Admitting: Internal Medicine

## 2018-06-03 DIAGNOSIS — Z1231 Encounter for screening mammogram for malignant neoplasm of breast: Secondary | ICD-10-CM

## 2018-06-16 ENCOUNTER — Ambulatory Visit
Admission: RE | Admit: 2018-06-16 | Discharge: 2018-06-16 | Disposition: A | Discharge status: Home / Self Care | Payer: 59 | Source: Ambulatory Visit | Attending: Internal Medicine | Admitting: Internal Medicine

## 2018-06-16 DIAGNOSIS — Z1231 Encounter for screening mammogram for malignant neoplasm of breast: Secondary | ICD-10-CM | POA: Insufficient documentation

## 2018-09-26 ENCOUNTER — Encounter: Payer: Self-pay | Admitting: Internal Medicine

## 2018-09-26 ENCOUNTER — Ambulatory Visit (INDEPENDENT_AMBULATORY_CARE_PROVIDER_SITE_OTHER): Payer: 59 | Admitting: Internal Medicine

## 2018-09-26 VITALS — BP 128/76 | HR 59 | Ht 66.0 in | Wt 153.0 lb

## 2018-09-26 DIAGNOSIS — Z23 Encounter for immunization: Secondary | ICD-10-CM | POA: Diagnosis not present

## 2018-09-26 DIAGNOSIS — R7989 Other specified abnormal findings of blood chemistry: Secondary | ICD-10-CM | POA: Diagnosis not present

## 2018-09-26 DIAGNOSIS — Z0000 Encounter for general adult medical examination without abnormal findings: Secondary | ICD-10-CM

## 2018-09-26 DIAGNOSIS — Z1231 Encounter for screening mammogram for malignant neoplasm of breast: Secondary | ICD-10-CM | POA: Diagnosis not present

## 2018-09-26 DIAGNOSIS — K219 Gastro-esophageal reflux disease without esophagitis: Secondary | ICD-10-CM | POA: Diagnosis not present

## 2018-09-26 DIAGNOSIS — I471 Supraventricular tachycardia: Secondary | ICD-10-CM | POA: Diagnosis not present

## 2018-09-26 LAB — POCT URINALYSIS DIPSTICK
Bilirubin, UA: NEGATIVE
Glucose, UA: NEGATIVE
KETONES UA: NEGATIVE
NITRITE UA: NEGATIVE
PH UA: 6 (ref 5.0–8.0)
Protein, UA: NEGATIVE
RBC UA: NEGATIVE
SPEC GRAV UA: 1.01 (ref 1.010–1.025)
UROBILINOGEN UA: 0.2 U/dL

## 2018-09-26 NOTE — Progress Notes (Signed)
Date:  09/26/2018   Name:  Tammy Acevedo   DOB:  1971-06-01   MRN:  [id number]   Chief Complaint: Annual Exam (Flu shot.) Tammy Acevedo is a 47 y.o. female who presents today for her Complete Annual Exam. She feels fairly well. She reports exercising none. She reports she is sleeping fairly well.  Pap was done in 2018.  Mammogram was done in September.  SVT - followed by Cardiology, on beta blocker daily with no recent symptoms.  GERD - intermittent heartburn about once a week.  She takes either TUMS or omeprazole as needed.    Review of Systems  Constitutional: Negative for chills, fatigue, fever and unexpected weight change.  HENT: Negative for congestion, hearing loss, tinnitus, trouble swallowing and voice change.   Eyes: Negative for visual disturbance.  Respiratory: Negative for cough, chest tightness, shortness of breath and wheezing.   Cardiovascular: Negative for chest pain, palpitations and leg swelling.  Gastrointestinal: Negative for abdominal pain, constipation, diarrhea and vomiting.  Endocrine: Negative for polydipsia and polyuria.  Genitourinary: Positive for pelvic pain (more discomfort with her last period). Negative for dysuria, frequency, genital sores, menstrual problem, vaginal bleeding and vaginal discharge.  Musculoskeletal: Negative for arthralgias, gait problem and joint swelling.  Skin: Negative for color change and rash.  Allergic/Immunologic: Negative for environmental allergies and food allergies.  Neurological: Negative for dizziness, tremors, light-headedness and headaches.  Hematological: Negative for adenopathy. Does not bruise/bleed easily.  Psychiatric/Behavioral: Negative for dysphoric mood and sleep disturbance. The patient is not nervous/anxious.     Patient Active Problem List   Diagnosis Date Noted  . GERD (gastroesophageal reflux disease) 12/17/2017  . Anxiety 12/17/2017  . SVT (supraventricular tachycardia) (HCC)  04/04/2017    No Known Allergies  Past Surgical History:  Procedure Laterality Date  . CARPAL TUNNEL RELEASE    . CHOLECYSTECTOMY    . TUBAL LIGATION      Social History   Tobacco Use  . Smoking status: Never Smoker  . Smokeless tobacco: Never Used  Substance Use Topics  . Alcohol use: No    Alcohol/week: 0.0 standard drinks  . Drug use: No     Medication list has been reviewed and updated.  Current Meds  Medication Sig  . metoprolol succinate (TOPROL XL) 25 MG 24 hr tablet Take 1 tablet (25 mg total) by mouth daily.    PHQ 2/9 Scores 09/26/2018 09/25/2017  PHQ - 2 Score 0 0   Wt Readings from Last 3 Encounters:  09/26/18 153 lb (69.4 kg)  05/01/18 148 lb (67.1 kg)  12/17/17 153 lb (69.4 kg)     Physical Exam Vitals signs and nursing note reviewed.  Constitutional:      General: She is not in acute distress.    Appearance: She is well-developed.  HENT:     Head: Normocephalic and atraumatic.     Right Ear: Tympanic membrane and ear canal normal.     Left Ear: Tympanic membrane and ear canal normal.     Nose:     Right Sinus: No maxillary sinus tenderness.     Left Sinus: No maxillary sinus tenderness.     Mouth/Throat:     Pharynx: Uvula midline.  Eyes:     General: No scleral icterus.       Right eye: No discharge.        Left eye: No discharge.     Conjunctiva/sclera: Conjunctivae normal.  Neck:     Musculoskeletal: Normal  range of motion. No erythema.     Thyroid: No thyromegaly.     Vascular: No carotid bruit.  Cardiovascular:     Rate and Rhythm: Normal rate and regular rhythm.     Pulses: Normal pulses.     Heart sounds: Normal heart sounds.  Pulmonary:     Effort: Pulmonary effort is normal. No respiratory distress.     Breath sounds: No wheezing.  Abdominal:     General: Bowel sounds are normal.     Palpations: Abdomen is soft.     Tenderness: There is no abdominal tenderness.  Musculoskeletal: Normal range of motion.    Lymphadenopathy:     Cervical: No cervical adenopathy.  Skin:    General: Skin is warm and dry.     Findings: No rash.  Neurological:     Mental Status: She is alert and oriented to person, place, and time.     Cranial Nerves: No cranial nerve deficit.     Sensory: No sensory deficit.     Deep Tendon Reflexes: Reflexes are normal and symmetric.  Psychiatric:        Speech: Speech normal.        Behavior: Behavior normal.        Thought Content: Thought content normal.     BP 128/76 (BP Location: Right Arm, Patient Position: Sitting, Cuff Size: Normal)   Pulse (!) 59   Ht 5\' 6"  (1.676 m)   Wt 153 lb (69.4 kg)   LMP 09/18/2018 (Approximate)   SpO2 97%   BMI 24.69 kg/m   Assessment and Plan: 1. Annual physical exam Normal exam Recommend regular exercise Colonoscopy next year - Lipid panel - POCT urinalysis dipstick  2. Encounter for screening mammogram for breast cancer Pt will schedule in September  3. SVT (supraventricular tachycardia) (HCC) On beta blocker - Comprehensive metabolic panel - TSH  4. Gastroesophageal reflux disease, esophagitis presence not specified Continue PRN over the counter medications - CBC with Differential/Platelet   Partially dictated using Animal nutritionistDragon software. Any errors are unintentional.  Bari EdwardLaura Clancy Mullarkey, MD [facility]  09/26/2018

## 2018-09-27 LAB — COMPREHENSIVE METABOLIC PANEL
ALBUMIN: 4.1 g/dL (ref 3.5–5.5)
ALK PHOS: 104 IU/L (ref 39–117)
ALT: 191 IU/L — ABNORMAL HIGH (ref 0–32)
AST: 67 IU/L — ABNORMAL HIGH (ref 0–40)
Albumin/Globulin Ratio: 1.7 (ref 1.2–2.2)
BILIRUBIN TOTAL: 0.4 mg/dL (ref 0.0–1.2)
BUN / CREAT RATIO: 7 — AB (ref 9–23)
BUN: 6 mg/dL (ref 6–24)
CHLORIDE: 102 mmol/L (ref 96–106)
CO2: 27 mmol/L (ref 20–29)
Calcium: 9.2 mg/dL (ref 8.7–10.2)
Creatinine, Ser: 0.82 mg/dL (ref 0.57–1.00)
GFR calc Af Amer: 97 mL/min/{1.73_m2} (ref >59)
GFR calc non Af Amer: 84 mL/min/{1.73_m2} (ref >59)
GLOBULIN, TOTAL: 2.4 g/dL (ref 1.5–4.5)
Glucose: 85 mg/dL (ref 65–99)
Potassium: 4 mmol/L (ref 3.5–5.2)
SODIUM: 141 mmol/L (ref 134–144)
Total Protein: 6.5 g/dL (ref 6.0–8.5)

## 2018-09-27 LAB — CBC WITH DIFFERENTIAL/PLATELET
Basophils Absolute: 0 10*3/uL (ref 0.0–0.2)
Basos: 0 %
EOS (ABSOLUTE): 0.1 10*3/uL (ref 0.0–0.4)
EOS: 2 %
HEMATOCRIT: 37.5 % (ref 34.0–46.6)
HEMOGLOBIN: 12.6 g/dL (ref 11.1–15.9)
Immature Grans (Abs): 0 10*3/uL (ref 0.0–0.1)
Immature Granulocytes: 0 %
LYMPHS ABS: 2 10*3/uL (ref 0.7–3.1)
Lymphs: 37 %
MCH: 28.6 pg (ref 26.6–33.0)
MCHC: 33.6 g/dL (ref 31.5–35.7)
MCV: 85 fL (ref 79–97)
MONOCYTES: 8 %
Monocytes Absolute: 0.5 10*3/uL (ref 0.1–0.9)
NEUTROS ABS: 2.8 10*3/uL (ref 1.4–7.0)
Neutrophils: 53 %
Platelets: 251 10*3/uL (ref 150–450)
RBC: 4.41 x10E6/uL (ref 3.77–5.28)
RDW: 12.4 % (ref 12.3–15.4)
WBC: 5.4 10*3/uL (ref 3.4–10.8)

## 2018-09-27 LAB — LIPID PANEL
CHOLESTEROL TOTAL: 155 mg/dL (ref 100–199)
Chol/HDL Ratio: 3.5 ratio (ref 0.0–4.4)
HDL: 44 mg/dL (ref >39)
LDL CALC: 86 mg/dL (ref 0–99)
Triglycerides: 127 mg/dL (ref 0–149)
VLDL Cholesterol Cal: 25 mg/dL (ref 5–40)

## 2018-09-27 LAB — TSH: TSH: 2.3 u[IU]/mL (ref 0.450–4.500)

## 2018-09-30 ENCOUNTER — Telehealth: Payer: Self-pay | Admitting: Internal Medicine

## 2018-09-30 ENCOUNTER — Encounter: Payer: Self-pay | Admitting: Internal Medicine

## 2018-09-30 LAB — SPECIMEN STATUS REPORT

## 2018-09-30 LAB — HEPATITIS PANEL, ACUTE
HEP A IGM: NEGATIVE
Hep B C IgM: NEGATIVE
Hep C Virus Ab: <0.1 s/co ratio (ref 0.0–0.9)
Hepatitis B Surface Ag: NEGATIVE

## 2018-09-30 NOTE — Telephone Encounter (Signed)
Let pt know that hepatitis testing was all negative.

## 2018-09-30 NOTE — Telephone Encounter (Signed)
Tried calling patient about normal labs. Could not reach. Left Vm informing labs came back negative. Told to call if any questions.

## 2018-12-26 ENCOUNTER — Telehealth: Payer: Self-pay

## 2018-12-26 NOTE — Telephone Encounter (Signed)
Virtual Visit Pre-Appointment Phone Call  Steps For Call:  1. Confirm consent - "In the setting of the current Covid19 crisis, you are scheduled for a TELEPHONE visit with your provider on 12/31/2018 at 10:40AM.  Just as we do with many in-office visits, in order for you to participate in this visit, we must obtain consent.  If you'd like, I can send this to your mychart (if signed up) or email for you to review.  Otherwise, I can obtain your verbal consent now.  All virtual visits are billed to your insurance company just like a normal visit would be.  By agreeing to a virtual visit, we'd like you to understand that the technology does not allow for your provider to perform an examination, and thus may limit your provider's ability to fully assess your condition.  Finally, though the technology is pretty good, we cannot assure that it will always work on either your or our end, and in the setting of a video visit, we may have to convert it to a phone-only visit.  In either situation, we cannot ensure that we have a secure connection.  Are you willing to proceed?"  2. Give patient instructions for WebEx download to smartphone as below if video visit  3. Advise patient to be prepared with any vital sign or heart rhythm information, their current medicines, and a piece of paper and pen handy for any instructions they may receive the day of their visit  4. Inform patient they will receive a phone call 15 minutes prior to their appointment time (may be from unknown caller ID) so they should be prepared to answer  5. Confirm that appointment type is correct in Epic appointment notes (video vs telephone)    TELEPHONE CALL NOTE  Tammy Acevedo has been deemed a candidate for a follow-up tele-health visit to limit community exposure during the Covid-19 pandemic. I spoke with the patient via phone to ensure availability of phone/video source, confirm preferred email & phone number, and discuss  instructions and expectations.  I reminded Tammy Acevedo to be prepared with any vital sign and/or heart rhythm information that could potentially be obtained via home monitoring, at the time of her visit. I reminded Tammy Acevedo to expect a phone call at the time of her visit if her visit.  Did the patient verbally acknowledge consent to treatment? YES  Tammy Acevedo, [place] 12/26/2018 3:46 PM   DOWNLOADING THE WEBEX SOFTWARE TO SMARTPHONE  - If Apple, go to [employer] and type in WebEx in the search bar. Download [employer], the blue/green circle. The app is free but as with any other app downloads, their phone may require them to verify saved payment information or Apple password. The patient does NOT have to create an account.  - If Android, ask patient to go to [employer] and type in WebEx in the search bar. Download [employer], the blue/green circle. The app is free but as with any other app downloads, their phone may require them to verify saved payment information or Android password. The patient does NOT have to create an account.   CONSENT FOR TELE-HEALTH VISIT - PLEASE REVIEW  I hereby voluntarily request, consent and authorize [facility] and its employed or contracted physicians, physician assistants, nurse practitioners or other licensed health care professionals (the Practitioner), to provide me with telemedicine health care services (the "Services") as deemed necessary by the treating Practitioner. I acknowledge  and consent to receive the Services by the Practitioner via telemedicine. I understand that the telemedicine visit will involve communicating with the Practitioner through live audiovisual communication technology and the disclosure of certain medical information by electronic transmission. I acknowledge that I have been given the opportunity to request an in-person assessment or other available alternative prior to the  telemedicine visit and am voluntarily participating in the telemedicine visit.  I understand that I have the right to withhold or withdraw my consent to the use of telemedicine in the course of my care at any time, without affecting my right to future care or treatment, and that the Practitioner or I may terminate the telemedicine visit at any time. I understand that I have the right to inspect all information obtained and/or recorded in the course of the telemedicine visit and may receive copies of available information for a reasonable fee.  I understand that some of the potential risks of receiving the Services via telemedicine include:  Marland Kitchen Delay or interruption in medical evaluation due to technological equipment failure or disruption; . Information transmitted may not be sufficient (e.g. poor resolution of images) to allow for appropriate medical decision making by the Practitioner; and/or  . In rare instances, security protocols could fail, causing a breach of personal health information.  Furthermore, I acknowledge that it is my responsibility to provide information about my medical history, conditions and care that is complete and accurate to the best of my ability. I acknowledge that Practitioner's advice, recommendations, and/or decision may be based on factors not within their control, such as incomplete or inaccurate data provided by me or distortions of diagnostic images or specimens that may result from electronic transmissions. I understand that the practice of medicine is not an exact science and that Practitioner makes no warranties or guarantees regarding treatment outcomes. I acknowledge that I will receive a copy of this consent concurrently upon execution via email to the email address I last provided but may also request a printed copy by calling the office of [facility].    I understand that my insurance will be billed for this visit.   I have read or had this consent read to me.  . I understand the contents of this consent, which adequately explains the benefits and risks of the Services being provided via telemedicine.  . I have been provided ample opportunity to ask questions regarding this consent and the Services and have had my questions answered to my satisfaction. . I give my informed consent for the services to be provided through the use of telemedicine in my medical care  By participating in this telemedicine visit I agree to the above.

## 2018-12-31 ENCOUNTER — Telehealth (INDEPENDENT_AMBULATORY_CARE_PROVIDER_SITE_OTHER): Payer: 59 | Admitting: Cardiovascular Disease

## 2018-12-31 ENCOUNTER — Other Ambulatory Visit: Payer: Self-pay

## 2018-12-31 DIAGNOSIS — K219 Gastro-esophageal reflux disease without esophagitis: Secondary | ICD-10-CM

## 2018-12-31 DIAGNOSIS — F419 Anxiety disorder, unspecified: Secondary | ICD-10-CM

## 2018-12-31 DIAGNOSIS — R7401 Elevation of levels of liver transaminase levels: Secondary | ICD-10-CM | POA: Insufficient documentation

## 2018-12-31 DIAGNOSIS — I471 Supraventricular tachycardia: Secondary | ICD-10-CM

## 2018-12-31 DIAGNOSIS — R740 Nonspecific elevation of levels of transaminase and lactic acid dehydrogenase [LDH]: Secondary | ICD-10-CM

## 2018-12-31 MED ORDER — METOPROLOL SUCCINATE ER 25 MG PO TB24: 25.0000 mg | ORAL_TABLET | Freq: Two times a day (BID) | ORAL | 3 refills | Status: DC

## 2018-12-31 NOTE — Progress Notes (Addendum)
Virtual Visit via Video Note   This visit type was conducted due to national recommendations for restrictions regarding the COVID-19 Pandemic (e.g. social distancing) in an effort to limit this patient's exposure and mitigate transmission in our community.  Due to her co-morbid illnesses, this patient is at least at moderate risk for complications without adequate follow up.  This format is felt to be most appropriate for this patient at this time.  All issues noted in this document were discussed and addressed.  A limited physical exam was performed with this format.  Please refer to the patient's chart for her consent to telehealth for [facility].    Date:  12/31/2018   ID:  Tammy Acevedo, DOB 08/30/1971, MRN [id number]  Patient Location:  [address]  [address] [place] [address]   Provider location:   [facility], [place] office  PCP:  Reubin Milan, MD  Cardiologist: Mariah Milling  Chief Complaint:  Abdominal pain, tachycardia WebCam visit   History of Present Illness:    Tammy Acevedo is a 48 y.o. female who presents via audio/video conferencing for a telehealth visit today.   The patient does not symptoms concerning for COVID-19 infection (fever, chills, cough, or new SHORTNESS OF BREATH).  Please refer to prior office visit for complete details:  Patient has a past medical history of  SVT several years ago, 2017 Was coming on every 3 to 6 month, better on metoprolol Who presents for routine follow-up of her SVT  Works at group home, [employer]   taking metoprolol 1 to 2 twice  Day No severe SVT, one bad one couple of months, 12 min (fluttering, dizzy, gets hot) Rare palpitations, in shower Overall feels well  130/78, pulse in the 60s  Having worsening GERD symptoms, daily Taking omeprazole occasionally with Tums   Patient denies chest pain, shortness breath, loss of consciousness, headache, fever, cough, colds, abdominal pain,  orthopnea, PND, edema.  Sometimes after she eats, she gets ABD pain  Total chol 155, LDL 86 TSH normal AST and ALT 67 and 191  Prior CV studies:   The following studies were reviewed today:  Other past medical history reviewed Echocardiogram 08/05/2015: [facility] NORMAL LEFT VENTRICULAR SYSTOLIC FUNCTION WITH AN ESTIMATED EF = 50 % NORMAL RIGHT VENTRICULAR SYSTOLIC FUNCTION MILD VALVULAR REGURGITATION (See above) mitral  NO VALVULAR STENOSIS  Exercise tolerance test 08/05/2015, Dr. Juliann Pares: Normal exercise stress test   No past medical history on file. Past Surgical History:  Procedure Laterality Date  . CARPAL TUNNEL RELEASE    . CHOLECYSTECTOMY    . TUBAL LIGATION       Current Meds  Medication Sig  . metoprolol succinate (TOPROL XL) 25 MG 24 hr tablet Take 1 tablet (25 mg total) by mouth daily.     Allergies:   Patient has no known allergies.   Social History   Tobacco Use  . Smoking status: Never Smoker  . Smokeless tobacco: Never Used  Substance Use Topics  . Alcohol use: No    Alcohol/week: 0.0 standard drinks  . Drug use: No     Current Outpatient Medications on File Prior to Visit  Medication Sig Dispense Refill  . metoprolol succinate (TOPROL XL) 25 MG 24 hr tablet Take 1 tablet (25 mg total) by mouth daily. 30 tablet 11   No current facility-administered medications on file prior to visit.      Family Hx: The patient's family history includes Breast  cancer in her maternal aunt and paternal aunt; COPD in her father; Diabetes in her mother.  ROS:   Please see the history of present illness.    Review of Systems  Constitutional: Negative.   Respiratory: Negative.   Cardiovascular: Positive for palpitations.       Paroxysmal tachycardia  Gastrointestinal: Negative.   Musculoskeletal: Negative.   Neurological: Negative.   Psychiatric/Behavioral: Negative.   All other systems reviewed and are negative.    Labs/Other Tests and Data  Reviewed:    Recent Labs: 09/26/2018: ALT 191; BUN 6; Creatinine, Ser 0.82; Hemoglobin 12.6; Platelets 251; Potassium 4.0; Sodium 141; TSH 2.300   Recent Lipid Panel Lab Results  Component Value Date/Time   CHOL 155 09/26/2018 09:41 AM   TRIG 127 09/26/2018 09:41 AM   HDL 44 09/26/2018 09:41 AM   CHOLHDL 3.5 09/26/2018 09:41 AM   CHOLHDL 4 12/15/2012 08:57 AM   LDLCALC 86 09/26/2018 09:41 AM    Wt Readings from Last 3 Encounters:  09/26/18 153 lb (69.4 kg)  05/01/18 148 lb (67.1 kg)  12/17/17 153 lb (69.4 kg)     Exam:    Vital Signs: Vitals detailed above  Well nourished, well developed female in no acute distress. Constitutional:  oriented to person, place, and time. No distress.  Head: Normocephalic and atraumatic.  Eyes:  no discharge. No scleral icterus.  Neck: Normal range of motion. Musculoskeletal: Normal range of motion.  no  tenderness or deformity.  Neurological:  normal muscle tone. Coordination normal.  Skin: No rash noted.  Psychiatric:  normal mood and affect. behavior is normal. Thought content normal.    ASSESSMENT & PLAN:    SVT (supraventricular tachycardia) (HCC) Reports having bad episode several months ago lasting 12 minutes Recommended she continue on metoprolol twice a day If she has worsening symptoms, then she call us Potentially could use antiarrhythmics versus ablation  Gastroesophageal reflux disease without esophagitis Feels her symptoms are under control  Anxiety Stable, managed by primary care  Transaminitis Labs reviewed, elevated ALT and AST She does report having some abdominal pain sometimes after she eats She also reports having gallbladder taken out in the past Will CC Dr. Asencion Partridge May need follow-up labs, could consider imaging ultrasound   COVID-19 Education: The signs and symptoms of COVID-19 were discussed with the patient and how to seek care for testing (follow up with PCP or arrange E-visit).  The importance of  social distancing was discussed today.  Patient Risk:   After full review of this patients clinical status, I feel that they are at least moderate risk at this time.  Time:   Today, I have spent 25 minutes with the patient with telehealth technology discussing transaminitis, management of SVT, GERD symptoms.     Medication Adjustments/Labs and Tests Ordered: Current medicines are reviewed at length with the patient today.  Concerns regarding medicines are outlined above.   Tests Ordered: No tests ordered   Medication Changes: Increase metoprolol succinate up to 25 twice daily   Disposition: Follow-up in 6 months   Signed, Julien Nordmann, MD  12/31/2018 11:23 AM    [facility] [address], [place], [place] [address]

## 2018-12-31 NOTE — Patient Instructions (Addendum)
Medication Instructions:  We will increase the metoprolol succinate 25 mg- up to 1 tablet twice a day   If you need a refill on your cardiac medications before your next appointment, please call your pharmacy.    Lab work: No new labs needed   If you have labs (blood work) drawn today and your tests are completely normal, you will receive your results only by: Marland Kitchen MyChart Message (if you have MyChart) OR . A paper copy in the mail If you have any lab test that is abnormal or we need to change your treatment, we will call you to review the results.   Testing/Procedures: No new testing needed   Follow-Up: At [facility], you and your health needs are our priority.  As part of our continuing mission to provide you with exceptional heart care, we have created designated Provider Care Teams.  These Care Teams include your primary Cardiologist (physician) and Advanced Practice Providers (APPs -  Physician Assistants and Nurse Practitioners) who all work together to provide you with the care you need, when you need it.  . You will need a follow up appointment in 12 months .   Please call our office 2 months in advance to schedule this appointment.    . Providers on your designated Care Team:   . Nicolasa Ducking, NP . Eula Listen, PA-C . Marisue Ivan, PA-C  Any Other Special Instructions Will Be Listed Below (If Applicable).  For educational health videos Log in to : [email or web address] Or : [email or web address], password : [email or web address]

## 2019-01-12 ENCOUNTER — Ambulatory Visit: Payer: 59 | Admitting: Cardiovascular Disease

## 2019-03-10 ENCOUNTER — Telehealth: Payer: Self-pay

## 2019-03-10 NOTE — Telephone Encounter (Signed)
Called patient and left VM asking her to call the office back to make a follow up with labs. (Liver lab recheck)  Awaiting for patient callback to discuss.

## 2019-05-13 ENCOUNTER — Other Ambulatory Visit: Payer: Self-pay | Admitting: Cardiovascular Disease

## 2019-06-02 ENCOUNTER — Other Ambulatory Visit: Payer: Self-pay | Admitting: Obstetrics and Gynecology

## 2019-06-02 DIAGNOSIS — Z1231 Encounter for screening mammogram for malignant neoplasm of breast: Secondary | ICD-10-CM

## 2019-06-23 ENCOUNTER — Other Ambulatory Visit: Payer: Self-pay | Admitting: Cardiovascular Disease

## 2019-06-23 NOTE — Telephone Encounter (Signed)
Pt has both metoprolol Tartrate 25 mg and Metoprolol succinate 25 mg tablet on medlist. Pt was switched from Metoprolol Tartrate to Metoprolol Succinate. Contacted pt to verify which medication she is taking. Unable to leave message vm full.

## 2019-06-29 NOTE — Telephone Encounter (Signed)
°*  STAT* If patient is at the pharmacy, call can be transferred to refill team.   1. Which medications need to be refilled? (please list name of each medication and dose if known) metoprolol tartrate 1 tablet 2 times daily   2. Which pharmacy/location (including street and city if local pharmacy) is medication to be sent to? [facility] on [address]  3. Do they need a 30 day or 90 day supply? 90 day

## 2019-06-29 NOTE — Telephone Encounter (Signed)
Lmovm to verify if pt is taking Metoprolol Tartrate 25 mg tablet bid or Metoprolol succinate 25 mg bid. Pt has both on medication list.

## 2019-06-30 ENCOUNTER — Other Ambulatory Visit: Payer: Self-pay | Admitting: *Deleted

## 2019-06-30 NOTE — Telephone Encounter (Signed)
To Dr. Rockey Situ to review/ approve metoprolol tartrate. He did an e-visit with the patient on 12/31/18.  Per 12/31/18 note from Dr. Rockey Situ: Medication Changes: Increase metoprolol succinate up to 25 twice daily

## 2019-06-30 NOTE — Telephone Encounter (Signed)
Pt is taking Metoprolol Tartrate 25 mg bid. Please advise correct Metoprolol to fill pt has both on medication list.

## 2019-07-01 NOTE — Telephone Encounter (Signed)
I spoke with the patient. She states she cannot really tell a difference between the metoprolol succinate and metoprolol tartrate. She is currently taking metoprolol tartrate 25 mg BID. She feels she is tolerating this well. She would like this refilled.  I advised I will refill metoprolol tartrate 25 mg BID for a 90-day supply. She will let us know if she feels like this is no longer working for her.

## 2019-07-01 NOTE — Telephone Encounter (Signed)
I believe she was on metoprolol succinate 25 daily but was taking extra due to breakthrough tachycardia and we increased the prescription in April up to metoprolol succinate 25 twice daily  I do not know where the prescription for metoprolol tartrate 25 twice daily came from in August 2020 Likely error of prescription  No harm done, Would see which she prefers short acting versus longer acting which helps her palpitations better We could continue the tartrate 25 twice daily if it is working Or change back to the succinate 25 twice daily if she had better relief

## 2019-07-21 ENCOUNTER — Ambulatory Visit
Admission: RE | Admit: 2019-07-21 | Discharge: 2019-07-21 | Disposition: A | Discharge status: Home / Self Care | Payer: 59 | Source: Ambulatory Visit | Attending: Obstetrics and Gynecology | Admitting: Obstetrics and Gynecology

## 2019-07-21 DIAGNOSIS — Z1231 Encounter for screening mammogram for malignant neoplasm of breast: Secondary | ICD-10-CM

## 2019-07-23 ENCOUNTER — Other Ambulatory Visit: Payer: Self-pay | Admitting: Obstetrics and Gynecology

## 2019-07-23 DIAGNOSIS — N6310 Unspecified lump in the right breast, unspecified quadrant: Secondary | ICD-10-CM

## 2019-07-23 DIAGNOSIS — R928 Other abnormal and inconclusive findings on diagnostic imaging of breast: Secondary | ICD-10-CM

## 2019-07-31 ENCOUNTER — Ambulatory Visit
Admission: RE | Admit: 2019-07-31 | Discharge: 2019-07-31 | Disposition: A | Discharge status: Home / Self Care | Payer: 59 | Source: Ambulatory Visit | Attending: Obstetrics and Gynecology | Admitting: Obstetrics and Gynecology

## 2019-07-31 ENCOUNTER — Other Ambulatory Visit: Payer: Self-pay | Admitting: Obstetrics and Gynecology

## 2019-07-31 DIAGNOSIS — N6310 Unspecified lump in the right breast, unspecified quadrant: Secondary | ICD-10-CM | POA: Diagnosis present

## 2019-07-31 DIAGNOSIS — N6459 Other signs and symptoms in breast: Secondary | ICD-10-CM | POA: Diagnosis not present

## 2019-07-31 DIAGNOSIS — R928 Other abnormal and inconclusive findings on diagnostic imaging of breast: Secondary | ICD-10-CM | POA: Diagnosis present

## 2019-08-06 ENCOUNTER — Other Ambulatory Visit: Payer: Self-pay | Admitting: Obstetrics and Gynecology

## 2019-08-06 DIAGNOSIS — R928 Other abnormal and inconclusive findings on diagnostic imaging of breast: Secondary | ICD-10-CM

## 2019-08-11 ENCOUNTER — Ambulatory Visit
Admission: RE | Admit: 2019-08-11 | Discharge: 2019-08-11 | Disposition: A | Discharge status: Home / Self Care | Payer: 59 | Source: Ambulatory Visit | Attending: Obstetrics and Gynecology | Admitting: Obstetrics and Gynecology

## 2019-08-11 DIAGNOSIS — R928 Other abnormal and inconclusive findings on diagnostic imaging of breast: Secondary | ICD-10-CM

## 2019-08-11 HISTORY — PX: BREAST BIOPSY: SHX20

## 2019-08-12 LAB — SURGICAL PATHOLOGY

## 2019-08-18 ENCOUNTER — Ambulatory Visit (INDEPENDENT_AMBULATORY_CARE_PROVIDER_SITE_OTHER): Payer: 59 | Admitting: Surgery

## 2019-08-18 ENCOUNTER — Other Ambulatory Visit: Payer: Self-pay

## 2019-08-18 ENCOUNTER — Encounter: Payer: Self-pay | Admitting: Surgery

## 2019-08-18 VITALS — BP 111/73 | HR 52 | Temp 97.2°F | Resp 14 | Ht 66.0 in | Wt 156.8 lb

## 2019-08-18 DIAGNOSIS — N6092 Unspecified benign mammary dysplasia of left breast: Secondary | ICD-10-CM | POA: Insufficient documentation

## 2019-08-18 NOTE — Progress Notes (Signed)
Patient ID: Tammy Acevedo, female   DOB: 03-Dec-1970, 48 y.o.   MRN: [id number]  Chief Complaint: Left breast architectural distortion, here for biopsy results and plan.  History of Present Illness Tammy Acevedo is a 48 y.o. 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  Biopsy was completed November 10.  She has no history of birth control use or hormonal therapy.  She is perimenopausal/menopausal.  Family history includes a maternal aunt.  She underwent menses at the age of 60.  History of 2 pregnancies.  No history of palpable breast masses or nipple discharge or breast skin changes.  She has intermittent histories of left and right breast pain.  She reportedly does monthly breast self-examinations and completes her annual mammography.   Past Medical History History reviewed. No pertinent past medical history.    Past Surgical History:  Procedure Laterality Date  . BREAST BIOPSY Left 08/11/2019   distortion, coil marker, path pending  . CARPAL TUNNEL RELEASE    . CHOLECYSTECTOMY    . TUBAL LIGATION      No Known Allergies  Current Outpatient Medications  Medication Sig Dispense Refill  . metoprolol tartrate (LOPRESSOR) 25 MG tablet Take 1 tablet (25 mg) by mouth twice daily 180 tablet 2   No current facility-administered medications for this visit.     Family History Family History  Problem Relation Age of Onset  . Diabetes Mother   . COPD Father   . Breast cancer Maternal Aunt       Social History Social History   Tobacco Use  . Smoking status: Never Smoker  . Smokeless tobacco: Never Used  Substance Use Topics  . Alcohol use: No    Alcohol/week: 0.0 standard drinks  . Drug use: No       Review of Systems  Constitutional: Negative.   HENT: Negative.   Eyes: Negative.   Cardiovascular: Negative.   Gastrointestinal: Negative.   Genitourinary: Negative.   Musculoskeletal:  Negative.   Skin: Negative.   Endo/Heme/Allergies: Negative.       Physical Exam Blood pressure 111/73, pulse (!) 52, temperature (!) 97.2 F (36.2 C), temperature source Temporal, resp. rate 14, height 5\' 6"  (1.676 m), weight 156 lb 12.8 oz (71.1 kg), SpO2 97 %.  CONSTITUTIONAL: Well-developed well-nourished Caucasian female in no apparent distress. EYES: Sclera non-icteric.   EARS, NOSE, MOUTH AND THROAT: The oropharynx is clear. Oral mucosa is pink and moist. Hearing is intact to voice.  NECK: Trachea is midline, and there is no jugular venous distension.  LYMPH NODES:  Lymph nodes in the neck are not enlarged. RESPIRATORY:  Lungs are clear, and breath sounds are equal bilaterally. Normal respiratory effort without pathologic use of accessory muscles. CARDIOVASCULAR: Heart is regular in rate and rhythm. GI: The abdomen is  soft, nontender, and nondistended.  Post laparoscopic cholecystectomy scars present and unremarkable without masses.  There were normal bowel sounds. GU: Bilateral breast exam shows symmetry.  Punctate scar in the upper outer quadrant of the left breast without appreciable ecchymosis or hematoma.  No suspicious or dominant masses or significant nodularity.  No appreciable axillary adenopathy.  Chaperone: . MUSCULOSKELETAL:  Symmetrical muscle tone appreciated in all four extremities.    SKIN: Skin turgor is normal. No pathologic skin lesions appreciated.  NEUROLOGIC:  Motor and sensation appear grossly normal.  Cranial nerves are grossly intact. PSYCH:  Alert and oriented to person, place and time. Affect is appropriate for  situation.  Data Reviewed I have personally reviewed the patient's imaging and medical records.   There is a marker, the open padlock appearance central within the posterior left breast.  Assessment    Radial scar with foci of atypical ductal hyperplasia, left breast.  Plan    Needle localized biopsy of the left breast.  We discussed  the likelihood of finding additional pathology.  She is well aware no additional pathology may be identified.  She is also aware that potential for missing the lesion is present.  We will plan on specimen mammography and additional pathology reports.  Anticipating possible hormonal blockade/tamoxifen/aromatase inhibitor for breast cancer risk reduction. Risks and benefits of the procedure discussed including anesthesia, bleeding, infection, scarring, breast asymmetry change in cosmesis etc.  I believe she understands, has her questions answered.  And desires to proceed.  Face-to-face time spent with the patient and care providers was 45 minutes, with more than 50% of the time spent counseling, educating, and coordinating care of the patient.      Ronny Bacon 08/18/2019, 10:22 AM

## 2019-08-18 NOTE — Patient Instructions (Signed)
Our surgery scheduler will call you within 24/48 hours to schedule your surgery. Please have the blue sheet available when speaking with her.  Lumpectomy  A lumpectomy, sometimes called a partial mastectomy, is surgery to remove a cancerous tumor or mass (the lump) from a breast. It is a form of "breast conserving" or "breast preservation" surgery. This means that the cancerous tissue is removed but the breast remains intact. During a lumpectomy, the portion of the breast that contains the tumor is removed. Some normal tissue around the lump may be taken out to make sure that all of the tumor has been removed. Lymph nodes under your arm may also be removed and tested to find out if the cancer has spread. Lymph nodes are part of the body's disease-fighting system (immune system) and are usually the first place where breast cancer spreads. Tell a health care provider about:  Any allergies you have.  All medicines you are taking, including vitamins, herbs, eye drops, creams, and over-the-counter medicines.  Any problems you or family members have had with anesthetic medicines.  Any blood disorders you have.  Any surgeries you have had.  Any medical conditions you have.  Whether you are pregnant or may be pregnant. What are the risks? Generally, this is a safe procedure. However, problems may occur, including:  Bleeding.  Infection.  Allergic reaction to medicines.  Pain, swelling, weakness, or numbness in the arm on the side of your surgery.  Temporary swelling.  Change in the shape of the breast, particularly if a large portion is removed.  Scar tissue that forms at the surgical site and feels hard to the touch. What happens before the procedure? Staying hydrated Follow instructions from your health care provider about hydration, which may include:  Up to 2 hours before the procedure - you may continue to drink clear liquids, such as water, clear fruit juice, black coffee, and  plain tea.  Eating and drinking restrictions  Follow instructions from your health care provider about eating and drinking, which may include: ? 8 hours before the procedure - stop eating heavy meals or foods such as meat, fried foods, or fatty foods. ? 6 hours before the procedure - stop eating light meals or foods, such as toast or cereal. ? 6 hours before the procedure - stop drinking milk or drinks that contain milk. ? 2 hours before the procedure - stop drinking clear liquids. Medicines  Ask your health care provider about: ? Changing or stopping your regular medicines. This is especially important if you are taking diabetes medicines or blood thinners. ? Taking medicines such as aspirin and ibuprofen. These medicines can thin your blood. Do not take these medicines before your procedure if your health care provider instructs you not to.  You may be given antibiotic medicine to help prevent infection. General instructions  Prior to surgery, your health care provider may do a procedure to locate and mark the tumor area in your breast (localization). This procedure will help guide your surgeon to where the incision will be made. This may be done with: ? Imaging. This may include a mammogram, ultrasound, or MRI. ? Insertion of a small wire, clip, seed, or radar reflector implant.  You may be screened for extra fluid around the lymph nodes (lymphedema).  Ask your health care provider how your surgical site will be marked or identified.  Plan to have someone take you home from the hospital or clinic. What happens during the procedure?   To  lower your risk of infection: ? Your health care team will wash or sanitize their hands. ? Your skin will be washed with soap.  An IV will be inserted into one of your veins.  You will be given one or more of the following: ? A medicine to help you relax (sedative). ? A medicine to numb the area (local anesthetic). ? A medicine to make you  fall asleep (general anesthetic).  Your health care provider will use a kind of electric scalpel that uses heat to minimize bleeding (electrocautery knife). A curved incision that follows the natural curve of your breast will be made. This type of incision will allow for minimal scarring and better healing.  The tumor will be removed along with some of the surrounding tissue. This will be sent to the lab for testing. Your health care provider may also remove lymph nodes at this time if needed.  If the tumor is close to the muscles over your chest, some muscle tissue may also be removed.  A small drain tube may be inserted into your breast area or armpit to collect fluid that may build up after surgery. This tube will be connected to a suction bulb on the outside of your body to remove the fluid.  The incision will be closed with stitches (sutures).  A bandage (dressing) may be placed over the incision. The procedure may vary among health care providers and hospitals. What happens after the procedure?  Your blood pressure, heart rate, breathing rate, and blood oxygen level will be monitored until you leave the hospital or clinic.  You will be given medicine for pain as needed.  Your IV will be removed when you are able to eat and drink by mouth.  You will be encouraged to get up and walk as soon as you can. This is important to improve blood flow and breathing. Ask for help if you feel weak or unsteady.  You may have a drain tube in place for 2-3 days to prevent a collection of blood (hematoma) from developing in the breast. You will be given instructions about caring for the drain before you go home.  A pressure bandage may be applied for 1-2 days to prevent bleeding or swelling. Your pressure bandage may look like a thick piece of fabric or an elastic wrap. Ask your health care provider how to care for your bandage at home.  You may be given a tight sleeve to wear over your arm on the  side of your surgery. You should wear this sleeve as told by your health care provider.  Do not drive for 24 hours if you were given a sedative during your procedure. Summary  A lumpectomy, sometimes called a partial mastectomy, is surgery to remove a cancerous tumor or mass (the lump) from a breast.  During a lumpectomy, the portion of the breast that contains the tumor is removed. Some normal tissue around the lump may be taken out to make sure that all of the tumor has been removed. Lymph nodes under your arm may also be removed and tested to find out if the cancer has spread.  You may have a drain tube in place for 2-3 days to prevent a collection of blood (hematoma) from developing in the breast. You will be given instructions about caring for the drain before you go home.  Plan to have someone take you home from the hospital or clinic. This information is not intended to replace advice given to  you by your health care provider. Make sure you discuss any questions you have with your health care provider. Document Released: 10/29/2006 Document Revised: 03/18/2018 Document Reviewed: 05/30/2016 Elsevier Patient Education  2020 [employer]

## 2019-08-24 ENCOUNTER — Ambulatory Visit: Payer: Self-pay | Admitting: Surgery

## 2019-08-24 DIAGNOSIS — N6092 Unspecified benign mammary dysplasia of left breast: Secondary | ICD-10-CM

## 2019-08-25 ENCOUNTER — Telehealth: Payer: Self-pay | Admitting: Surgery

## 2019-08-25 ENCOUNTER — Other Ambulatory Visit: Payer: Self-pay | Admitting: Surgery

## 2019-08-25 DIAGNOSIS — N6092 Unspecified benign mammary dysplasia of left breast: Secondary | ICD-10-CM

## 2019-08-25 NOTE — Telephone Encounter (Signed)
Pt has been advised of pre admission date/time, Covid Testing date and Surgery date.  Surgery Date: 09/02/19 with Dr Ulysees Barns breast biopsy with NL.  Preadmission Testing Date: 08/31/19 between 1-5:00pm-phone interview.  Covid Testing Date: 08/31/19 between 8-10:30am - patient advised to go to the [facility]  [employer] Video sent via [employer] [facility] and [employer].  Patient has been made aware to arrive at [facility] at 9:15am the day of surgery.

## 2019-08-31 ENCOUNTER — Other Ambulatory Visit: Payer: Self-pay

## 2019-08-31 ENCOUNTER — Encounter
Admission: RE | Admit: 2019-08-31 | Discharge: 2019-08-31 | Disposition: A | Discharge status: Home / Self Care | Payer: 59 | Source: Ambulatory Visit | Attending: Surgery | Admitting: Surgery

## 2019-08-31 DIAGNOSIS — Z01812 Encounter for preprocedural laboratory examination: Secondary | ICD-10-CM | POA: Insufficient documentation

## 2019-08-31 DIAGNOSIS — U071 COVID-19: Secondary | ICD-10-CM | POA: Diagnosis not present

## 2019-08-31 HISTORY — DX: COVID-19: U07.1

## 2019-08-31 HISTORY — DX: Essential (primary) hypertension: I10

## 2019-08-31 LAB — SARS CORONAVIRUS 2 (TAT 6-24 HRS): SARS Coronavirus 2: POSITIVE — AB

## 2019-08-31 NOTE — Patient Instructions (Addendum)
Your procedure is scheduled on: Wednesday 09/02/19.  Report to DAY SURGERY DEPARTMENT LOCATED ON 2ND FLOOR MEDICAL MALL ENTRANCE. To find out your arrival time please call [phone] between 1PM - 3PM on Tuesday 09/01/19.  Remember: Instructions that are not followed completely may result in serious medical risk, up to and including death, or upon the discretion of your surgeon and anesthesiologist your surgery may need to be rescheduled.      _X__ 1. Do not eat food after midnight the night before your procedure.                 No gum chewing or hard candies. You may drink clear liquids up to 2 hours                 before you are scheduled to arrive for your surgery- DO NOT drink clear                 liquids within 2 hours of the start of your surgery.                 Clear Liquids include:  water, apple juice without pulp, clear carbohydrate                 drink such as Clearfast or Gatorade, Black Coffee or Tea (Do not add                 anything to coffee or tea).   __X__2.  On the morning of surgery brush your teeth with toothpaste and water, you may rinse your mouth with mouthwash if you wish.  Do not swallow any toothpaste or mouthwash.      __X__3.  Notify your doctor if there is any change in your medical condition      (cold, fever, infections).       Do not wear jewelry, make-up, hairpins, clips or nail polish. Do not wear lotions, powders, or perfumes.  Do not shave 48 hours prior to surgery. Men may shave face and neck. Do not bring valuables to the hospital.      [facility] is not responsible for any belongings or valuables.    Contacts, dentures/partials or body piercings may not be worn into surgery. Bring a case for your contacts, glasses or hearing aids, a denture cup will be supplied.    Patients discharged the day of surgery will not be allowed to drive home.    __X__ Take these medicines the morning of surgery with A SIP OF WATER:     1.  metoprolol tartrate (LOPRESSOR) 25 MG tablet      __X__ Use CHG Soap/SAGE wipes as directed    __X__ Stop Anti-inflammatories 7 days before surgery such as Advil, Ibuprofen, Motrin, BC or Goodies Powder, Naprosyn, Naproxen, Aleve, Aspirin, Meloxicam. May take Tylenol if needed for pain or discomfort.     __X__ Don't start taking any new herbal supplements before your procedure.

## 2019-09-01 ENCOUNTER — Telehealth: Payer: Self-pay

## 2019-09-01 ENCOUNTER — Telehealth: Payer: Self-pay | Admitting: Surgery

## 2019-09-01 NOTE — Telephone Encounter (Signed)
Pt called just to let our office know she has tested positive for Covid.  Benedict Needy, CMA

## 2019-09-01 NOTE — Telephone Encounter (Signed)
Tammy Acevedo From [facility] called and informed me that the patient's Covid Screening test that was done on 08/31/19 came back positive. Dr Christian Mate has been informed. Patient's surgery for 09/02/19-Left Breast Biopsy with NL has been cancelled at this time. Patient has been informed of her results and the surgery cancellation. Patient states that she started having a "head-cold" 2 days ago (08/30/19) with headache. She states that her temp has stayed around 99.0 F. I did advise her to speak with her PCP via virtual appointment with any concerns or questions or go to the ED if her signs and symptoms become life threatening.   I have informed the patient that once we are able to reschedule her surgery, that I will call her with the information. Patient understands.

## 2019-09-02 ENCOUNTER — Ambulatory Visit: Admission: RE | Admit: 2019-09-02 | Payer: 59 | Source: Home / Self Care | Admitting: Surgery

## 2019-09-02 ENCOUNTER — Encounter: Admission: RE | Payer: Self-pay | Source: Home / Self Care

## 2019-09-02 ENCOUNTER — Ambulatory Visit: Payer: 59

## 2019-09-02 SURGERY — BREAST BIOPSY WITH NEEDLE LOCALIZATION
Anesthesia: Choice | Laterality: Left

## 2019-09-16 ENCOUNTER — Telehealth: Payer: Self-pay | Admitting: Surgery

## 2019-09-16 NOTE — Telephone Encounter (Signed)
Pt has been advised of pre admission date/time, Covid Testing date and Surgery date.  Surgery Date: 10/07/19 with Dr Ulysees Barns breast bx with NL.  Preadmission Testing Date: 12/30 between 8-12:00pm-phone interview.  Covid Testing Date: Patient does not require a covid test due to a positive test result on 11/30.   [facility] Video sent via [facility] Video and Covid Education Video.  Patient has been made aware to arrive at [facility] at 7:45am the day of surgery.

## 2019-09-29 ENCOUNTER — Other Ambulatory Visit: Payer: Self-pay

## 2019-09-29 ENCOUNTER — Ambulatory Visit (INDEPENDENT_AMBULATORY_CARE_PROVIDER_SITE_OTHER): Payer: 59 | Admitting: Internal Medicine

## 2019-09-29 ENCOUNTER — Encounter: Payer: Self-pay | Admitting: Internal Medicine

## 2019-09-29 VITALS — BP 100/64 | HR 56 | Ht 66.0 in | Wt 158.0 lb

## 2019-09-29 DIAGNOSIS — G5603 Carpal tunnel syndrome, bilateral upper limbs: Secondary | ICD-10-CM

## 2019-09-29 DIAGNOSIS — Z1211 Encounter for screening for malignant neoplasm of colon: Secondary | ICD-10-CM

## 2019-09-29 DIAGNOSIS — Z23 Encounter for immunization: Secondary | ICD-10-CM

## 2019-09-29 DIAGNOSIS — Z0000 Encounter for general adult medical examination without abnormal findings: Secondary | ICD-10-CM | POA: Diagnosis not present

## 2019-09-29 DIAGNOSIS — I471 Supraventricular tachycardia: Secondary | ICD-10-CM

## 2019-09-29 DIAGNOSIS — N6092 Unspecified benign mammary dysplasia of left breast: Secondary | ICD-10-CM

## 2019-09-29 LAB — POCT URINALYSIS DIPSTICK
Bilirubin, UA: NEGATIVE
Glucose, UA: NEGATIVE
Ketones, UA: NEGATIVE
Nitrite, UA: NEGATIVE
Protein, UA: NEGATIVE
Spec Grav, UA: <=1.005 — AB (ref 1.010–1.025)
Urobilinogen, UA: 0.2 E.U./dL
pH, UA: 6 (ref 5.0–8.0)

## 2019-09-29 NOTE — Patient Instructions (Signed)
Get cock up wrist splints and wear while sleeping on both wrists Take tylenol at bedtime to reduce wrist inflammation

## 2019-09-29 NOTE — Progress Notes (Signed)
Date:  09/29/2019   Name:  Tammy PrinceDiamond Murray Standiford   DOB:  27-Mar-1971   MRN:  [id number]   Chief Complaint: Annual Exam (no pap, no breast exam) Tammy Acevedo is a [age] y.o. female who presents today for her Complete Annual Exam. She feels well. She reports exercising - nothing structured but has a physical job. She reports she is sleeping fairly well. She was infected with Covid 19 in early December.  Has recovered completely.  Recent mammogram and biopsy - PATHOLOGY revealed: A. BREAST DISTORTION, LEFT; STEREOTACTIC BIOPSY: - RADIAL SCAR WITH FOCAL ATYPICAL DUCTAL HYPERPLASIA. She is referred for surgical biopsy in January.   Colonoscopy - none Pap  2018 negative with cotesting Flu vaccine due Immunization History  Administered Date(s) Administered  . Influenza,inj,Quad PF,6+ Mos 09/25/2017, 09/26/2018  . Tdap 12/22/2012    HPI  Lab Results  Component Value Date   CREATININE 0.82 09/26/2018   BUN 6 09/26/2018   NA 141 09/26/2018   K 4.0 09/26/2018   CL 102 09/26/2018   CO2 27 09/26/2018   Lab Results  Component Value Date   CHOL 155 09/26/2018   HDL 44 09/26/2018   LDLCALC 86 09/26/2018   TRIG 127 09/26/2018   CHOLHDL 3.5 09/26/2018   Lab Results  Component Value Date   TSH 2.300 09/26/2018   No results found for: HGBA1C   Review of Systems  Constitutional: Negative for chills, fatigue and fever.  HENT: Negative for congestion, hearing loss, tinnitus, trouble swallowing and voice change.   Eyes: Negative for visual disturbance.  Respiratory: Negative for cough, chest tightness, shortness of breath and wheezing.   Cardiovascular: Negative for chest pain, palpitations and leg swelling.  Gastrointestinal: Negative for abdominal pain, blood in stool, constipation, diarrhea and vomiting.  Endocrine: Negative for polydipsia and polyuria.  Genitourinary: Negative for dysuria, frequency, genital sores, menstrual problem, vaginal bleeding and vaginal  discharge.  Musculoskeletal: Negative for arthralgias, gait problem and joint swelling.  Skin: Negative for color change and rash.  Neurological: Positive for numbness (in both hands during the night). Negative for dizziness, tremors, light-headedness and headaches.  Hematological: Negative for adenopathy. Does not bruise/bleed easily.  Psychiatric/Behavioral: Negative for dysphoric mood and sleep disturbance. The patient is not nervous/anxious.     Patient Active Problem List   Diagnosis Date Noted  . 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 Transaminitis 12/31/2018  . Gastroesophageal reflux disease without esophagitis 12/17/2017  . Anxiety 12/17/2017  . SVT (supraventricular tachycardia) (HCC) 04/04/2017    No Known Allergies  Past Surgical History:  Procedure Laterality Date  . BREAST BIOPSY Left 08/11/2019   distortion, coil marker, path pending  . CARPAL TUNNEL RELEASE    . CHOLECYSTECTOMY    . TUBAL LIGATION      Social History   Tobacco Use  . Smoking status: Never Smoker  . Smokeless tobacco: Never Used  Substance Use Topics  . Alcohol use: No    Alcohol/week: 0.0 standard drinks  . Drug use: No     Medication list has been reviewed and updated.  Current Meds  Medication Sig  . acetaminophen (TYLENOL) 500 MG tablet Take 1,000 mg by mouth every 6 (six) hours as needed for moderate pain.   . metoprolol tartrate (LOPRESSOR) 25 MG tablet Take 1 tablet (25 mg) by mouth twice daily (Patient taking differently: Take 25 mg by mouth 2 (two) times daily. )    PHQ 2/9 Scores 09/29/2019 09/26/2018 09/25/2017  PHQ - 2 Score 0 0 0  PHQ- 9 Score 0 - -    BP Readings from Last 3 Encounters:  09/29/19 100/64  08/18/19 111/73  09/26/18 128/76    Physical Exam Vitals and nursing note reviewed.  Constitutional:      General: She is not in acute distress.    Appearance: She is well-developed.  HENT:     Head: Normocephalic and atraumatic.      Right Ear: Tympanic membrane and ear canal normal.     Left Ear: Tympanic membrane and ear canal normal.     Nose:     Right Sinus: No maxillary sinus tenderness.     Left Sinus: No maxillary sinus tenderness.  Eyes:     General: No scleral icterus.       Right eye: No discharge.        Left eye: No discharge.     Conjunctiva/sclera: Conjunctivae normal.  Neck:     Thyroid: No thyromegaly.     Vascular: No carotid bruit.  Cardiovascular:     Rate and Rhythm: Normal rate and regular rhythm.     Pulses: Normal pulses.     Heart sounds: Normal heart sounds.  Pulmonary:     Effort: Pulmonary effort is normal. No respiratory distress.     Breath sounds: No wheezing.  Chest:     Comments: Breast exam deferred to upcoming surgery and recent mammogram Abdominal:     General: Bowel sounds are normal.     Palpations: Abdomen is soft.     Tenderness: There is no abdominal tenderness.  Musculoskeletal:        General: Normal range of motion.     Right wrist: Normal.     Left wrist: Normal.     Cervical back: Normal range of motion. No erythema.     Comments: Neg Tinel's and Phalen's bilaterally  Lymphadenopathy:     Cervical: No cervical adenopathy.  Skin:    General: Skin is warm and dry.     Findings: No rash.  Neurological:     Mental Status: She is alert and oriented to person, place, and time.     Cranial Nerves: No cranial nerve deficit.     Sensory: No sensory deficit.     Deep Tendon Reflexes: Reflexes are normal and symmetric.  Psychiatric:        Attention and Perception: Attention normal.        Mood and Affect: Mood normal.        Speech: Speech normal.        Behavior: Behavior normal.        Thought Content: Thought content normal.     Wt Readings from Last 3 Encounters:  09/29/19 158 lb (71.7 kg)  08/18/19 156 lb 12.8 oz (71.1 kg)  09/26/18 153 lb (69.4 kg)    BP 100/64   Pulse (!) 56   Ht 5\' 6"  (1.676 m)   Wt 158 lb (71.7 kg)   LMP 09/01/2019  (Approximate)   SpO2 98%   BMI 25.50 kg/m   Assessment and Plan: 1. Annual physical exam Normal exam Recommend regular exercise, continue healthy diet - Lipid panel - POCT urinalysis dipstick  2. Colon cancer screening Referred for colonoscopy  - Ambulatory referral to Gastroenterology  3. SVT (supraventricular tachycardia) (HCC) Symptoms are controlled with beta blocker daily without concerns or side effects She is followed by Cardiology. - CBC with Differential/Platelet - Comprehensive metabolic panel - TSH  4.  Atypical ductal hyperplasia of left breast Scheduled for excisional biopsy 10/07/2019  5. Bilateral carpal tunnel syndrome Recurrent symptoms in both wrists - she has had surgery on the right in the past Could not elicit symptoms today. Recommend cock up wrist splints to be worn while sleeping and tylenol at bedtime Follow up with Ortho if symptoms worsen   Partially dictated using Editor, commissioning. Any errors are unintentional.  Halina Maidens, MD [facility]  09/29/2019

## 2019-09-30 ENCOUNTER — Other Ambulatory Visit: Payer: Self-pay

## 2019-09-30 ENCOUNTER — Encounter
Admission: RE | Admit: 2019-09-30 | Discharge: 2019-09-30 | Disposition: A | Discharge status: Home / Self Care | Payer: 59 | Source: Ambulatory Visit | Attending: Surgery | Admitting: Surgery

## 2019-09-30 HISTORY — DX: Supraventricular tachycardia: I47.1

## 2019-09-30 LAB — CBC WITH DIFFERENTIAL/PLATELET
Basophils Absolute: 0 10*3/uL (ref 0.0–0.2)
Basos: 0 %
EOS (ABSOLUTE): 0.1 10*3/uL (ref 0.0–0.4)
Eos: 1 %
Hematocrit: 38 % (ref 34.0–46.6)
Hemoglobin: 12.9 g/dL (ref 11.1–15.9)
Immature Grans (Abs): 0 10*3/uL (ref 0.0–0.1)
Immature Granulocytes: 0 %
Lymphocytes Absolute: 2.1 10*3/uL (ref 0.7–3.1)
Lymphs: 34 %
MCH: 29.3 pg (ref 26.6–33.0)
MCHC: 33.9 g/dL (ref 31.5–35.7)
MCV: 86 fL (ref 79–97)
Monocytes Absolute: 0.5 10*3/uL (ref 0.1–0.9)
Monocytes: 7 %
Neutrophils Absolute: 3.5 10*3/uL (ref 1.4–7.0)
Neutrophils: 58 %
Platelets: 230 10*3/uL (ref 150–450)
RBC: 4.41 x10E6/uL (ref 3.77–5.28)
RDW: 13.1 % (ref 11.7–15.4)
WBC: 6.3 10*3/uL (ref 3.4–10.8)

## 2019-09-30 LAB — LIPID PANEL
Chol/HDL Ratio: 3.2 ratio (ref 0.0–4.4)
Cholesterol, Total: 150 mg/dL (ref 100–199)
HDL: 47 mg/dL (ref >39)
LDL Chol Calc (NIH): 87 mg/dL (ref 0–99)
Triglycerides: 86 mg/dL (ref 0–149)
VLDL Cholesterol Cal: 16 mg/dL (ref 5–40)

## 2019-09-30 LAB — COMPREHENSIVE METABOLIC PANEL
ALT: 11 IU/L (ref 0–32)
AST: 15 IU/L (ref 0–40)
Albumin/Globulin Ratio: 1.8 (ref 1.2–2.2)
Albumin: 4.3 g/dL (ref 3.8–4.8)
Alkaline Phosphatase: 78 IU/L (ref 39–117)
BUN/Creatinine Ratio: 11 (ref 9–23)
BUN: 9 mg/dL (ref 6–24)
Bilirubin Total: 0.5 mg/dL (ref 0.0–1.2)
CO2: 23 mmol/L (ref 20–29)
Calcium: 8.9 mg/dL (ref 8.7–10.2)
Chloride: 104 mmol/L (ref 96–106)
Creatinine, Ser: 0.83 mg/dL (ref 0.57–1.00)
GFR calc Af Amer: 95 mL/min/{1.73_m2} (ref >59)
GFR calc non Af Amer: 82 mL/min/{1.73_m2} (ref >59)
Globulin, Total: 2.4 g/dL (ref 1.5–4.5)
Glucose: 88 mg/dL (ref 65–99)
Potassium: 4.3 mmol/L (ref 3.5–5.2)
Sodium: 139 mmol/L (ref 134–144)
Total Protein: 6.7 g/dL (ref 6.0–8.5)

## 2019-09-30 LAB — TSH: TSH: 1.31 u[IU]/mL (ref 0.450–4.500)

## 2019-09-30 NOTE — Pre-Procedure Instructions (Signed)
Tammy Acevedo, Tammy Acevedo  Physician  Cardiology  Progress Notes  Addendum  Encounter Date: 12/31/2018          Expand AllCollapse All    Show:Clear all  ManualTemplateCopied Added by:  Tammy Acevedo, Tammy Acevedo Hover for details                                                                                                         Virtual Visit via Video Note  This visit type was conducted due to national recommendations for restrictions regarding the COVID-19 Pandemic (e.g. social distancing) in an effort to limit this patient's exposure and mitigate transmission in our community. Due to her co-morbid illnesses, this patient is at least at moderate risk for complications without adequate follow up. This format is felt to be most appropriate for this patient at this time. All issues noted in this document were discussed and addressed. A limited physical exam was performed with this format. Please refer to the patient's chart for her consent to telehealth for [facility].  Date: 12/31/2018  ID: Tammy Acevedo, DOB 12-02-1970, MRN [id number]  Patient Location:  [place] [address]  Provider location:  [facility], [place] office  PCP: Tammy Acevedo, Tammy Acevedo  Cardiologist: Tammy Acevedo  Chief Complaint: Abdominal pain, tachycardia  WebCam visit   History of Present Illness:   Tammy Acevedo is a 48 y.o. female who presents via audio/video conferencing for a telehealth visit today.  The patient does not symptoms concerning for COVID-19 infection (fever, chills, cough, or new SHORTNESS OF BREATH).  Please refer to prior office visit for complete details:  Patient has a past medical history of  SVT several years ago, 2017  Was coming on every 3 to 6 month, better on metoprolol  Who presents for routine follow-up of her SVT  Works at group home, Tammy Acevedo    taking metoprolol 1 to 2 twice Day  No severe SVT, one bad one couple of months, 12 min (fluttering, dizzy, gets hot)  Rare palpitations, in shower  Overall feels well  130/78, pulse in the 60s  Having worsening GERD symptoms, daily  Taking omeprazole occasionally with Tums  Patient denies chest pain, shortness breath, loss of consciousness, headache, fever, cough, colds, abdominal pain, orthopnea, PND, edema.  Sometimes after she eats, she gets ABD pain  Total chol 155, LDL 86  TSH normal  AST and ALT 67 and 191   Prior CV studies:   The following studies were reviewed today:  Other past medical history reviewed  Echocardiogram 08/05/2015: [facility]  NORMAL LEFT VENTRICULAR SYSTOLIC FUNCTION WITH AN ESTIMATED EF = 50 % NORMAL RIGHT VENTRICULAR SYSTOLIC FUNCTION MILD VALVULAR REGURGITATION (See above) mitral  NO VALVULAR STENOSIS  Exercise tolerance test 08/05/2015, Dr. Clayborn Acevedo:  Normal exercise stress test  No past medical history on file.          Past Surgical History:   Procedure Laterality Date   . CARPAL TUNNEL RELEASE     . CHOLECYSTECTOMY     .  TUBAL LIGATION         Active Medications                             Electronically signed by Tammy Acevedo, Tammy Acevedo at 12/31/2018 5:24 PM  Electronically signed by Tammy Acevedo, Tammy Acevedo at 01/14/2019 7:10 PM       Telemedicine on 12/31/2018  Revision History  Detailed Report

## 2019-09-30 NOTE — Patient Instructions (Signed)
Your procedure is scheduled on: 10-06-18 [facility] Report to [place] @ 7:45 AM  Remember: Instructions that are not followed completely may result in serious medical risk, up to and including death, or upon the discretion of your surgeon and anesthesiologist your surgery may need to be rescheduled.    _x___ 1. Do not eat food after midnight the night before your procedure. NO GUM OR CANDY AFTER MIDNIGHT. You may drink clear liquids up to 2 hours before you are scheduled to arrive at the hospital for your procedure.  Do not drink clear liquids within 2 hours of your scheduled arrival to the hospital.  Clear liquids include  --Water or Apple juice without pulp  --Gatorade  --Black Coffee or Clear Tea (No milk, no creamers, do not add anything to the coffee or Tea   ____Ensure clear carbohydrate drink on the way to the hospital for bariatric patients  ____Ensure clear carbohydrate drink 3 hours before surgery.     __x__ 2. No Alcohol for 24 hours before or after surgery.   __x__3. No Smoking or e-cigarettes for 24 prior to surgery.  Do not use any chewable tobacco products for at least 6 hour prior to surgery   ____  4. Bring all medications with you on the day of surgery if instructed.    __x__ 5. Notify your doctor if there is any change in your medical condition     (cold, fever, infections).    x___6. On the morning of surgery brush your teeth with toothpaste and water.  You may rinse your mouth with mouth wash if you wish.  Do not swallow any toothpaste or mouthwash.   Do not wear jewelry, make-up, hairpins, clips or nail polish.  Do not wear lotions, powders, or perfumes.  Do not shave 48 hours prior to surgery. Men may shave face and neck.  Do not bring valuables to the hospital.    [facility] is not responsible for any belongings or valuables.               Contacts, dentures or bridgework may not be worn into surgery.  Leave your suitcase in the car. After  surgery it may be brought to your room.  For patients admitted to the hospital, discharge time is determined by your treatment team.  _  Patients discharged the day of surgery will not be allowed to drive home.  You will need someone to drive you home and stay with you the night of your procedure.    Please read over the following fact sheets that you were given:   [facility] Preparing for Surgery  _x___ TAKE THE FOLLOWING MEDICATION THE MORNING OF SURGERY WITH A SMALL SIP OF WATER. These include:  1. METOPROLOL  2.  3.  4.  5.  6.  ____Fleets enema or Magnesium Citrate as directed.   _x___ Use CHG Soap or sage wipes as directed on instruction sheet   ____ Use inhalers on the day of surgery and bring to hospital day of surgery  ____ Stop Metformin and Janumet 2 days prior to surgery.    ____ Take 1/2 of usual insulin dose the night before surgery and none on the morning  surgery.   ____ Follow recommendations from Cardiologist, Pulmonologist or PCP regarding stopping Aspirin, Coumadin, Plavix ,Eliquis, Effient, or Pradaxa, and Pletal.  X____Stop Anti-inflammatories such as Advil, Aleve, Ibuprofen, Motrin, Naproxen, Naprosyn, Goodies powders or aspirin products NOW- OK to take Tylenol    ____  Stop supplements until after surgery.   ____ Bring C-Pap to the hospital.

## 2019-10-05 ENCOUNTER — Other Ambulatory Visit: Payer: 59

## 2019-10-05 ENCOUNTER — Encounter
Admission: RE | Admit: 2019-10-05 | Discharge: 2019-10-05 | Disposition: A | Discharge status: Home / Self Care | Payer: 59 | Source: Ambulatory Visit | Attending: Surgery | Admitting: Surgery

## 2019-10-05 ENCOUNTER — Other Ambulatory Visit: Payer: Self-pay

## 2019-10-05 DIAGNOSIS — I471 Supraventricular tachycardia: Secondary | ICD-10-CM | POA: Diagnosis not present

## 2019-10-05 DIAGNOSIS — I498 Other specified cardiac arrhythmias: Secondary | ICD-10-CM | POA: Diagnosis not present

## 2019-10-05 NOTE — Patient Instructions (Signed)
INSTRUCTIONS FOR SURGERY     Your surgery is scheduled for:   Monday, October 12, 2019  Please contact [phone] on Friday, January 8th to confirm surgery time.   Currently, you are scheduled for a 7:00 am surgery but would need to arrive by 5:30 am. Please follow instructions included in writing as to where to go for checkin.    REMEMBER: Instructions that are not followed completely may result in serious medical risk,  up to and including death, or upon the discretion of your surgeon and anesthesiologist,            your surgery may need to be rescheduled.  __X__ 1. Do not eat food after midnight the night before your procedure.                    No gum, candy, lozenger, tic tacs, tums or hard candies.                  ABSOLUTELY NOTHING SOLID IN YOUR MOUTH AFTER MIDNIGHT                    You may drink unlimited clear liquids up to 2 hours before you are scheduled to arrive for surgery.                   Do not drink anything within those 2 hours unless you need to take medicine, then take the                   smallest amount you need.  Clear liquids include:  water, apple juice without pulp,                   any flavor Gatorade, Black coffee, black tea.  Sugar may be added but no dairy/ honey /lemon.                        Broth and jello is not considered a clear liquid.  __x__  2. On the morning of surgery, please brush your teeth with toothpaste and water. You may rinse with                  mouthwash if you wish but DO NOT SWALLOW TOOTHPASTE OR MOUTHWASH  __X___3. NO alcohol for 24 hours before or after surgery.  __x___ 4.  Do NOT smoke or use e-cigarettes for 24 HOURS PRIOR TO SURGERY.                      DO NOT Use any chewable tobacco products for at least 6 hours prior to surgery.  __x___ 5. If you start any new medication after this appointment and prior to surgery, please                   Bring it with you  on the day of surgery.  ___x__ 6. Notify your doctor if there is any change in your medical condition, such as fever, infection, vomitting,                   Diarrhea  or any open sores.  __x___ 7.  USE the CHG SOAP as instructed, the night before surgery and the day of surgery.                   Once you have washed with this soap, do NOT use any of the following: Powders, perfumes                    or lotions. Please do not wear make up, hairpins, clips or nail polish. You may wear deodorant.                                     Men may shave their face and neck.  Women need to shave 48 hours prior to surgery.                   DO NOT wear ANY jewelry on the day of surgery. If there are rings that are too tight to                    remove easily, please address this prior to the surgery day. Piercings need to be removed.                                                                     NO METAL ON YOUR BODY.                    Do NOT bring any valuables.  If you came to Pre-Admit testing then you will not need license,                     insurance card or credit card.  If you will be staying overnight, please either leave your things in                     the car or have your family be responsible for these items.                     [facility] IS NOT RESPONSIBLE FOR BELONGINGS OR VALUABLES.  ___X__ 8. DO NOT wear contact lenses on surgery day.  You may not have dentures,                     Hearing aides, contacts or glasses in the operating room. These items can be                    Placed in the Recovery Room to receive immediately after surgery.  __x___ 9. IF YOU ARE SCHEDULED TO GO HOME ON THE SAME DAY, YOU MUST                   Have someone to drive you home and to stay with you  for the first 24 hours.                    Have an arrangement prior to arriving on surgery day.  ___x__ 10. Take the following medications on the morning of surgery with a sip of water:  1.                     2.                     3.                     4.                     5.                     6.  _____ 11.  Follow any instructions provided to you by your surgeon.                        Such as enema, clear liquid bowel prep  __X__  12. STOP ASPIRIN AS OF:  Today, January 4th                       THIS INCLUDES BC POWDERS / GOODIES POWDER  __x___ 13. STOP Anti-inflammatories as of:  Today, January 4th                      This includes IBUPROFEN / MOTRIN / ADVIL / ALEVE/ NAPROXYN                    YOU MAY TAKE TYLENOL ANY TIME PRIOR TO SURGERY.  _____ 14.  Stop supplements until after surgery.                     This includes:                 You may continue taking Vitamin B12 / Vitamin D3 but do not take on the morning of surgery.  ______17.  Continue to take the following medications but do not take on the morning of surgery:   ______18. If staying overnight, please have appropriate shoes to wear to be able to walk around the unit.                   Wear clean and comfortable clothing to the hospital.

## 2019-10-07 ENCOUNTER — Ambulatory Visit
Admission: RE | Admit: 2019-10-07 | Discharge: 2019-10-07 | Disposition: A | Discharge status: Home / Self Care | Payer: 59 | Source: Ambulatory Visit | Attending: Surgery | Admitting: Surgery

## 2019-10-07 ENCOUNTER — Encounter: Payer: Self-pay | Admitting: Surgery

## 2019-10-07 ENCOUNTER — Other Ambulatory Visit: Payer: Self-pay

## 2019-10-07 ENCOUNTER — Ambulatory Visit
Admission: RE | Admit: 2019-10-07 | Discharge: 2019-10-07 | Disposition: A | Discharge status: Home / Self Care | Payer: 59 | Attending: Surgery | Admitting: Surgery

## 2019-10-07 ENCOUNTER — Ambulatory Visit: Payer: 59 | Admitting: Anesthesiology

## 2019-10-07 ENCOUNTER — Encounter
Admission: RE | Disposition: A | Discharge status: Home / Self Care | Payer: Self-pay | Source: Home / Self Care | Attending: Surgery

## 2019-10-07 DIAGNOSIS — L905 Scar conditions and fibrosis of skin: Secondary | ICD-10-CM | POA: Insufficient documentation

## 2019-10-07 DIAGNOSIS — Z825 Family history of asthma and other chronic lower respiratory diseases: Secondary | ICD-10-CM | POA: Diagnosis not present

## 2019-10-07 DIAGNOSIS — N6022 Fibroadenosis of left breast: Secondary | ICD-10-CM | POA: Diagnosis not present

## 2019-10-07 DIAGNOSIS — N6092 Unspecified benign mammary dysplasia of left breast: Secondary | ICD-10-CM

## 2019-10-07 DIAGNOSIS — K219 Gastro-esophageal reflux disease without esophagitis: Secondary | ICD-10-CM | POA: Diagnosis not present

## 2019-10-07 DIAGNOSIS — N62 Hypertrophy of breast: Secondary | ICD-10-CM | POA: Diagnosis not present

## 2019-10-07 DIAGNOSIS — Z79899 Other long term (current) drug therapy: Secondary | ICD-10-CM | POA: Diagnosis not present

## 2019-10-07 DIAGNOSIS — I10 Essential (primary) hypertension: Secondary | ICD-10-CM | POA: Insufficient documentation

## 2019-10-07 DIAGNOSIS — Z9049 Acquired absence of other specified parts of digestive tract: Secondary | ICD-10-CM | POA: Insufficient documentation

## 2019-10-07 DIAGNOSIS — Z803 Family history of malignant neoplasm of breast: Secondary | ICD-10-CM | POA: Insufficient documentation

## 2019-10-07 DIAGNOSIS — N644 Mastodynia: Secondary | ICD-10-CM | POA: Insufficient documentation

## 2019-10-07 DIAGNOSIS — Z833 Family history of diabetes mellitus: Secondary | ICD-10-CM | POA: Diagnosis not present

## 2019-10-07 DIAGNOSIS — F419 Anxiety disorder, unspecified: Secondary | ICD-10-CM | POA: Diagnosis not present

## 2019-10-07 DIAGNOSIS — I471 Supraventricular tachycardia: Secondary | ICD-10-CM | POA: Diagnosis not present

## 2019-10-07 HISTORY — PX: BREAST BIOPSY: SHX20

## 2019-10-07 HISTORY — PX: BREAST LUMPECTOMY: SHX2

## 2019-10-07 LAB — POCT PREGNANCY, URINE: Preg Test, Ur: NEGATIVE

## 2019-10-07 SURGERY — BREAST BIOPSY WITH NEEDLE LOCALIZATION
Anesthesia: General | Site: Breast | Laterality: Left

## 2019-10-07 MED ORDER — ONDANSETRON HCL 4 MG/2ML IJ SOLN
INTRAMUSCULAR | Status: DC | PRN
  Administered 2019-10-07: 4 mg via INTRAVENOUS

## 2019-10-07 MED ORDER — CEFAZOLIN SODIUM-DEXTROSE 2-4 GM/100ML-% IV SOLN
INTRAVENOUS | Status: AC
  Filled 2019-10-07: qty 100

## 2019-10-07 MED ORDER — DEXAMETHASONE SODIUM PHOSPHATE 10 MG/ML IJ SOLN
INTRAMUSCULAR | Status: DC | PRN
  Administered 2019-10-07: 8 mg via INTRAVENOUS

## 2019-10-07 MED ORDER — FAMOTIDINE 20 MG PO TABS
ORAL_TABLET | ORAL | Status: AC
  Administered 2019-10-07: 20 mg via ORAL
  Filled 2019-10-07: qty 1

## 2019-10-07 MED ORDER — MEPERIDINE HCL 50 MG/ML IJ SOLN: 6.2500 mg | INTRAMUSCULAR | Status: DC | PRN

## 2019-10-07 MED ORDER — SODIUM CHLORIDE 0.9 % IV SOLN
INTRAVENOUS | Status: DC | PRN
  Administered 2019-10-07: 15 ug via INTRAVENOUS
  Administered 2019-10-07: 25 ug via INTRAVENOUS

## 2019-10-07 MED ORDER — CHLORHEXIDINE GLUCONATE CLOTH 2 % EX PADS
6.0000 | MEDICATED_PAD | Freq: Once | CUTANEOUS | Status: AC
  Administered 2019-10-07: 6 via TOPICAL

## 2019-10-07 MED ORDER — BUPIVACAINE HCL (PF) 0.25 % IJ SOLN
INTRAMUSCULAR | Status: AC
  Filled 2019-10-07: qty 30

## 2019-10-07 MED ORDER — CEFAZOLIN SODIUM-DEXTROSE 2-4 GM/100ML-% IV SOLN
2.0000 g | INTRAVENOUS | Status: AC
  Administered 2019-10-07: 2 g via INTRAVENOUS

## 2019-10-07 MED ORDER — DEXAMETHASONE SODIUM PHOSPHATE 10 MG/ML IJ SOLN
INTRAMUSCULAR | Status: AC
  Filled 2019-10-07: qty 1

## 2019-10-07 MED ORDER — HYDROCODONE-ACETAMINOPHEN 5-325 MG PO TABS: 1.0000 | ORAL_TABLET | Freq: Four times a day (QID) | ORAL | 0 refills | Status: DC | PRN

## 2019-10-07 MED ORDER — FENTANYL CITRATE (PF) 100 MCG/2ML IJ SOLN
INTRAMUSCULAR | Status: AC
  Filled 2019-10-07: qty 2

## 2019-10-07 MED ORDER — MIDAZOLAM HCL 2 MG/2ML IJ SOLN
INTRAMUSCULAR | Status: AC
  Filled 2019-10-07: qty 2

## 2019-10-07 MED ORDER — MIDAZOLAM HCL 2 MG/2ML IJ SOLN
INTRAMUSCULAR | Status: DC | PRN
  Administered 2019-10-07: 2 mg via INTRAVENOUS

## 2019-10-07 MED ORDER — PHENYLEPHRINE HCL (PRESSORS) 10 MG/ML IV SOLN
INTRAVENOUS | Status: DC | PRN
  Administered 2019-10-07 (×2): 50 ug via INTRAVENOUS
  Administered 2019-10-07 (×2): 100 ug via INTRAVENOUS

## 2019-10-07 MED ORDER — LIDOCAINE HCL (CARDIAC) PF 100 MG/5ML IV SOSY
PREFILLED_SYRINGE | INTRAVENOUS | Status: DC | PRN
  Administered 2019-10-07: 100 mg via INTRAVENOUS

## 2019-10-07 MED ORDER — EPHEDRINE SULFATE 50 MG/ML IJ SOLN
INTRAMUSCULAR | Status: DC | PRN
  Administered 2019-10-07 (×3): 5 mg via INTRAVENOUS

## 2019-10-07 MED ORDER — PROPOFOL 10 MG/ML IV BOLUS
INTRAVENOUS | Status: DC | PRN
  Administered 2019-10-07: 110 mg via INTRAVENOUS

## 2019-10-07 MED ORDER — FENTANYL CITRATE (PF) 100 MCG/2ML IJ SOLN
INTRAMUSCULAR | Status: DC | PRN
  Administered 2019-10-07 (×2): 25 ug via INTRAVENOUS

## 2019-10-07 MED ORDER — OXYCODONE HCL 5 MG PO TABS: 5.0000 mg | ORAL_TABLET | Freq: Once | ORAL | Status: DC | PRN

## 2019-10-07 MED ORDER — OXYCODONE HCL 5 MG/5ML PO SOLN: 5.0000 mg | Freq: Once | ORAL | Status: DC | PRN

## 2019-10-07 MED ORDER — EPHEDRINE SULFATE 50 MG/ML IJ SOLN
INTRAMUSCULAR | Status: AC
  Filled 2019-10-07: qty 1

## 2019-10-07 MED ORDER — FENTANYL CITRATE (PF) 100 MCG/2ML IJ SOLN: 25.0000 ug | INTRAMUSCULAR | Status: DC | PRN

## 2019-10-07 MED ORDER — FAMOTIDINE 20 MG PO TABS: 20.0000 mg | ORAL_TABLET | Freq: Once | ORAL | Status: AC

## 2019-10-07 MED ORDER — LIDOCAINE HCL (PF) 1 % IJ SOLN
INTRAMUSCULAR | Status: AC
  Filled 2019-10-07: qty 30

## 2019-10-07 MED ORDER — ACETAMINOPHEN 500 MG PO TABS
ORAL_TABLET | ORAL | Status: AC
  Administered 2019-10-07: 1000 mg via ORAL
  Filled 2019-10-07: qty 2

## 2019-10-07 MED ORDER — EPINEPHRINE PF 1 MG/ML IJ SOLN
INTRAMUSCULAR | Status: AC
  Filled 2019-10-07: qty 1

## 2019-10-07 MED ORDER — ACETAMINOPHEN 500 MG PO TABS: 1000.0000 mg | ORAL_TABLET | ORAL | Status: AC

## 2019-10-07 MED ORDER — LACTATED RINGERS IV SOLN: INTRAVENOUS | Status: DC

## 2019-10-07 MED ORDER — PROMETHAZINE HCL 25 MG/ML IJ SOLN: 6.2500 mg | INTRAMUSCULAR | Status: DC | PRN

## 2019-10-07 MED ORDER — BUPIVACAINE-EPINEPHRINE 0.25% -1:200000 IJ SOLN
INTRAMUSCULAR | Status: DC | PRN
  Administered 2019-10-07: 20 mL

## 2019-10-07 MED ORDER — ONDANSETRON HCL 4 MG/2ML IJ SOLN
INTRAMUSCULAR | Status: AC
  Filled 2019-10-07: qty 2

## 2019-10-07 SURGICAL SUPPLY — 37 items
BINDER BREAST LRG (GAUZE/BANDAGES/DRESSINGS) ×1 IMPLANT
BLADE SURG 15 STRL LF DISP TIS (BLADE) ×1 IMPLANT
BLADE SURG 15 STRL SS (BLADE) ×1
CANISTER SUCT 1200ML W/VALVE (MISCELLANEOUS) ×2 IMPLANT
CHLORAPREP W/TINT 26 (MISCELLANEOUS) ×2 IMPLANT
CNTNR SPEC 2.5X3XGRAD LEK (MISCELLANEOUS) ×1
CONT SPEC 4OZ STER OR WHT (MISCELLANEOUS) ×1
CONTAINER SPEC 2.5X3XGRAD LEK (MISCELLANEOUS) ×1 IMPLANT
COVER WAND RF STERILE (DRAPES) ×2 IMPLANT
DECANTER SPIKE VIAL GLASS SM (MISCELLANEOUS) ×2 IMPLANT
DERMABOND ADVANCED (GAUZE/BANDAGES/DRESSINGS) ×1
DERMABOND ADVANCED .7 DNX12 (GAUZE/BANDAGES/DRESSINGS) ×1 IMPLANT
DEVICE DUBIN SPECIMEN MAMMOGRA (MISCELLANEOUS) ×2 IMPLANT
DRAPE LAPAROTOMY TRNSV 106X77 (MISCELLANEOUS) ×2 IMPLANT
DRSG GAUZE FLUFF 36X18 (GAUZE/BANDAGES/DRESSINGS) ×1 IMPLANT
ELECT CAUTERY BLADE TIP 2.5 (TIP) ×2
ELECT REM PT RETURN 9FT ADLT (ELECTROSURGICAL) ×2
ELECTRODE CAUTERY BLDE TIP 2.5 (TIP) ×1 IMPLANT
ELECTRODE REM PT RTRN 9FT ADLT (ELECTROSURGICAL) ×1 IMPLANT
GLOVE BIO SURGEON STRL SZ 6.5 (GLOVE) ×1 IMPLANT
GLOVE INDICATOR 7.0 STRL GRN (GLOVE) ×1 IMPLANT
GLOVE ORTHO TXT STRL SZ7.5 (GLOVE) ×6 IMPLANT
GOWN STRL REUS W/ TWL LRG LVL3 (GOWN DISPOSABLE) ×2 IMPLANT
GOWN STRL REUS W/TWL LRG LVL3 (GOWN DISPOSABLE) ×3
KIT MARKER MARGIN INK (KITS) ×2 IMPLANT
KIT TURNOVER KIT A (KITS) ×2 IMPLANT
NEEDLE HYPO 22GX1.5 SAFETY (NEEDLE) ×2 IMPLANT
PACK BASIN MINOR ARMC (MISCELLANEOUS) ×2 IMPLANT
SHEARS HARMONIC 9CM CVD (BLADE) IMPLANT
SUT MNCRL 4-0 (SUTURE) ×1
SUT MNCRL 4-0 27XMFL (SUTURE) ×1
SUT VIC AB 3-0 SH 27 (SUTURE) ×1
SUT VIC AB 3-0 SH 27X BRD (SUTURE) ×1 IMPLANT
SUTURE MNCRL 4-0 27XMF (SUTURE) ×1 IMPLANT
SYR 10ML LL (SYRINGE) ×2 IMPLANT
SYR BULB IRRIG 60ML STRL (SYRINGE) ×1 IMPLANT
WATER STERILE IRR 1000ML POUR (IV SOLUTION) ×2 IMPLANT

## 2019-10-07 NOTE — Anesthesia Preprocedure Evaluation (Signed)
Anesthesia Evaluation  Patient identified by MRN, date of birth, ID band Patient awake    Reviewed: Allergy & Precautions, NPO status , Patient's Chart, lab work & pertinent test results  History of Anesthesia Complications Negative for: history of anesthetic complications  Airway Mallampati: II  TM Distance: >3 FB Neck ROM: Full    Dental no notable dental hx.    Pulmonary neg pulmonary ROS, neg sleep apnea, neg COPD,    breath sounds clear to auscultation- rhonchi (-) wheezing      Cardiovascular hypertension, (-) CAD, (-) Past MI, (-) Cardiac Stents and (-) CABG + dysrhythmias Supra Ventricular Tachycardia  Rhythm:Regular Rate:Normal - Systolic murmurs and - Diastolic murmurs    Neuro/Psych neg Seizures Anxiety negative neurological ROS     GI/Hepatic Neg liver ROS, GERD  ,  Endo/Other  negative endocrine ROSneg diabetes  Renal/GU negative Renal ROS     Musculoskeletal negative musculoskeletal ROS (+)   Abdominal (+) - obese,   Peds  Hematology negative hematology ROS (+)   Anesthesia Other Findings Past Medical History: No date: Hypertension No date: SVT (supraventricular tachycardia) (HCC)   Reproductive/Obstetrics                             Anesthesia Physical Anesthesia Plan  ASA: II  Anesthesia Plan: General   Post-op Pain Management:    Induction: Intravenous  PONV Risk Score and Plan: 2 and Ondansetron, Dexamethasone and Midazolam  Airway Management Planned: LMA  Additional Equipment:   Intra-op Plan:   Post-operative Plan:   Informed Consent: I have reviewed the patients History and Physical, chart, labs and discussed the procedure including the risks, benefits and alternatives for the proposed anesthesia with the patient or authorized representative who has indicated his/her understanding and acceptance.     Dental advisory given  Plan Discussed with: CRNA  and Anesthesiologist  Anesthesia Plan Comments:         Anesthesia Quick Evaluation

## 2019-10-07 NOTE — H&P (Signed)
Chief Complaint: Left breast architectural distortion, here for biopsy results and plan.  History of Present Illness Tammy Acevedo is a 49 y.o. 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  Biopsy was completed November 10.  She has no history of birth control use or hormonal therapy.  She is perimenopausal/menopausal.  Family history includes a maternal aunt.  She underwent menses at the age of 68.  History of 2 pregnancies.  No history of palpable breast masses or nipple discharge or breast skin changes.  She has intermittent histories of left and right breast pain.  She reportedly does monthly breast self-examinations and completes her annual mammography.   Past Medical History History reviewed. No pertinent past medical history.         Past Surgical History:  Procedure Laterality Date  . BREAST BIOPSY Left 08/11/2019   distortion, coil marker, path pending  . CARPAL TUNNEL RELEASE    . CHOLECYSTECTOMY    . TUBAL LIGATION      No Known Allergies        Current Outpatient Medications  Medication Sig Dispense Refill  . metoprolol tartrate (LOPRESSOR) 25 MG tablet Take 1 tablet (25 mg) by mouth twice daily 180 tablet 2   No current facility-administered medications for this visit.     Family History      Family History  Problem Relation Age of Onset  . Diabetes Mother   . COPD Father   . Breast cancer Maternal Aunt       Social History Social History        Tobacco Use  . Smoking status: Never Smoker  . Smokeless tobacco: Never Used  Substance Use Topics  . Alcohol use: No    Alcohol/week: 0.0 standard drinks  . Drug use: No       Review of Systems  Constitutional: Negative.   HENT: Negative.   Eyes: Negative.   Cardiovascular: Negative.   Gastrointestinal: Negative.   Genitourinary: Negative.   Musculoskeletal: Negative.   Skin: Negative.    Endo/Heme/Allergies: Negative.       Physical Exam   CONSTITUTIONAL: Well-developed well-nourished Caucasian female in no apparent distress. EYES: Sclera non-icteric.   EARS, NOSE, MOUTH AND THROAT: The oropharynx is clear. Oral mucosa is pink and moist. Hearing is intact to voice.  NECK: Trachea is midline, and there is no jugular venous distension.  LYMPH NODES:  Lymph nodes in the neck are not enlarged. RESPIRATORY:  Lungs are clear, and breath sounds are equal bilaterally. Normal respiratory effort without pathologic use of accessory muscles. CARDIOVASCULAR: Heart is regular in rate and rhythm. GI: The abdomen is  soft, nontender, and nondistended.  Post laparoscopic cholecystectomy scars present and unremarkable without masses.  There were normal bowel sounds. GU: Bilateral breast exam shows symmetry.  Punctate scar in the upper outer quadrant of the left breast without appreciable ecchymosis or hematoma.  No suspicious or dominant masses or significant nodularity.  No appreciable axillary adenopathy.  Chaperone: Freda Munro. MUSCULOSKELETAL:  Symmetrical muscle tone appreciated in all four extremities.    SKIN: Skin turgor is normal. No pathologic skin lesions appreciated.  NEUROLOGIC:  Motor and sensation appear grossly normal.  Cranial nerves are grossly intact. PSYCH:  Alert and oriented to person, place and time. Affect is appropriate for situation.  Data Reviewed I have personally reviewed the patient's imaging and medical records.   There is a marker, the open padlock appearance central within the posterior  left breast.  Assessment  Assessment    Radial scar with foci of atypical ductal hyperplasia, left breast.  Plan   Plan    Needle localized biopsy of the left breast.  We discussed the likelihood of finding additional pathology.  She is well aware no additional pathology may be identified.  She is also aware that potential for missing the lesion is present.   We will plan on specimen mammography and additional pathology reports.  Anticipating possible hormonal blockade/tamoxifen/aromatase inhibitor for breast cancer risk reduction. Risks and benefits of the procedure discussed including anesthesia, bleeding, infection, scarring, breast asymmetry change in cosmesis etc.  I believe she understands, has her questions answered.  And desires to proceed.

## 2019-10-07 NOTE — Discharge Instructions (Signed)
   Follow up with Dr. Claudine Mouton October 15, 2019 at 10:00am  AMBULATORY SURGERY  DISCHARGE INSTRUCTIONS   1) The drugs that you were given will stay in your system until tomorrow so for the next 24 hours you should not:  A) Drive an automobile B) Make any legal decisions C) Drink any alcoholic beverage   2) You may resume regular meals tomorrow.  Today it is better to start with liquids and gradually work up to solid foods.  You may eat anything you prefer, but it is better to start with liquids, then soup and crackers, and gradually work up to solid foods.   3) Please notify your doctor immediately if you have any unusual bleeding, trouble breathing, redness and pain at the surgery site, drainage, fever, or pain not relieved by medication.    4) Additional Instructions:        Please contact your physician with any problems or Same Day Surgery at [phone], Monday through Friday 6 am to 4 pm, or [place] at [facility] number at [phone].

## 2019-10-07 NOTE — Anesthesia Postprocedure Evaluation (Signed)
Anesthesia Post Note  Patient: Tammy Acevedo  Procedure(s) Performed: BREAST BIOPSY WITH NEEDLE LOCALIZATION (Left Breast)  Patient location during evaluation: PACU Anesthesia Type: General Level of consciousness: awake and alert and oriented Pain management: pain level controlled Vital Signs Assessment: post-procedure vital signs reviewed and stable Respiratory status: spontaneous breathing, nonlabored ventilation and respiratory function stable Cardiovascular status: blood pressure returned to baseline and stable Postop Assessment: no signs of nausea or vomiting Anesthetic complications: no     Last Vitals:  Vitals:   10/07/19 1325 10/07/19 1335  BP: 114/65 118/60  Pulse: 62 (!) 59  Resp: 16 16  Temp: (!) 36.3 C 36.5 C  SpO2: 99% 99%    Last Pain:  Vitals:   10/07/19 1335  TempSrc: Temporal  PainSc: 0-No pain                 Murriel Eidem

## 2019-10-07 NOTE — Interval H&P Note (Signed)
History and Physical Interval Note:  10/07/2019 11:00 AM  Tammy Acevedo  has presented today for surgery, with the diagnosis of atypical ductal hyperplasia.  The various methods of treatment have been discussed with the patient and family. After consideration of risks, benefits and other options for treatment, the patient has consented to  Procedure(s): BREAST BIOPSY WITH NEEDLE LOCALIZATION (Left) as a surgical intervention.  The patient's history has been reviewed, patient examined, no change in status, stable for surgery.  I have reviewed the patient's chart and labs.  Questions were answered to the patient's satisfaction.   Left breast is marked.   Campbell Lerner

## 2019-10-07 NOTE — Anesthesia Procedure Notes (Signed)
Procedure Name: LMA Insertion Date/Time: 10/07/2019 11:08 AM Performed by: Karoline Caldwell, CRNA Pre-anesthesia Checklist: Patient identified, Patient being monitored, Timeout performed, Emergency Drugs available and Suction available Patient Re-evaluated:Patient Re-evaluated prior to induction Oxygen Delivery Method: Circle system utilized Preoxygenation: Pre-oxygenation with 100% oxygen Induction Type: IV induction Ventilation: Mask ventilation without difficulty LMA: LMA inserted LMA Size: 3.5 Tube type: Oral Number of attempts: 1 Placement Confirmation: positive ETCO2 and breath sounds checked- equal and bilateral Tube secured with: Tape Dental Injury: Teeth and Oropharynx as per pre-operative assessment

## 2019-10-07 NOTE — Op Note (Signed)
  Procedure Date:  10/07/2019  Pre-operative Diagnosis: Radial scar with focal atypical ductal hyperplasia of the left breast.  Post-operative Diagnosis: Same, final pathology pending.  Procedure: Left breast wire-localized lumpectomy.  Surgeon:  Campbell Lerner, M.D., [facility]  Anesthesia:  General endotracheal  Estimated Blood Loss: 15 ml  Specimens: 2 specimens along access of wire.  Separated, reconnected by mammographic clips.  Painted per guide.  Complications: None  Indications for Procedure:  This is a 49 y.o. female who presents with core biopsy results showing a foci of atypical ductal hyperplasia, imaging showed radial scar of the left breast.  The risks of bleeding, infection, injury to surrounding structures, hematoma, seroma, open wound, cosmetic deformity, and the need for further surgery were all discussed with the patient and was willing to proceed.  Prior to this procedure, the patient had undergone wire localization.  Description of Procedure: The patient was correctly identified in the preoperative area and brought into the operating room.  The patient was placed supine with VTE prophylaxis in place.  Appropriate time-outs were performed.  Anesthesia was induced and the patient was intubated.  Appropriate antibiotics were infused.  The right chest and axilla were prepped and draped in usual sterile fashion.   Attention was turned to the needle localization site where an incision was made either encompassing the needle insertion site, or at a strategically determined site for cosmesis, or direct access to the targeted lesion . Elecrocautery was used for dissection around the superficial tissues, and then scissor dissection is used to perform a lumpectomy with adequate margins.    The specmen's orientation is maintained and the the 6 surfaces painted as designated on the accompanying index.  As a more superficial specimen was removed along the axis of the wire leaving the wire  intact, I then completed the excision by removing the focus at the tip of the wire with the wire in place.  We painted and reconstructed the specimen utilizing the surface localizing clips.  The specimen including the wire was sent to radiology which confirmed an intact wire and prior biopsy site clip.  The cavity was irrigated and hemostasis was assured with electrocautery.   The wound was then closed in two layers with 3-0 Vicryl and 4-0 Monocryl and sealed with DermaBond.  Local anesthetic was infiltrated into the the cavity, as depot.   The patient was emerged from anesthesia and extubated and brought to the recovery room for further management.  The patient tolerated the procedure well and all counts were correct at the end of the case.   Campbell Lerner, M.D., [facility] 10/07/2019

## 2019-10-07 NOTE — Transfer of Care (Signed)
Immediate Anesthesia Transfer of Care Note  Patient: Tammy Acevedo  Procedure(s) Performed: BREAST BIOPSY WITH NEEDLE LOCALIZATION (Left Breast)  Patient Location: PACU  Anesthesia Type:General  Level of Consciousness: sedated  Airway & Oxygen Therapy: Patient Spontanous Breathing and Patient connected to face mask oxygen  Post-op Assessment: Report given to RN and Post -op Vital signs reviewed and stable  Post vital signs: Reviewed and stable  Last Vitals:  Vitals Value Taken Time  BP 128/61 10/07/19 1228  Temp 36.1 C 10/07/19 1228  Pulse 74 10/07/19 1229  Resp 16 10/07/19 1229  SpO2 100 % 10/07/19 1229  Vitals shown include unvalidated device data.  Last Pain:  Vitals:   10/07/19 1225  TempSrc:   PainSc: 0-No pain      Patients Stated Pain Goal: 0 (10/07/19 0903)  Complications: No apparent anesthesia complications

## 2019-10-08 LAB — SURGICAL PATHOLOGY

## 2019-10-15 ENCOUNTER — Encounter: Payer: Self-pay | Admitting: Surgery

## 2019-10-15 ENCOUNTER — Ambulatory Visit (INDEPENDENT_AMBULATORY_CARE_PROVIDER_SITE_OTHER): Payer: Self-pay | Admitting: Surgery

## 2019-10-15 ENCOUNTER — Other Ambulatory Visit: Payer: Self-pay

## 2019-10-15 VITALS — BP 127/78 | HR 69 | Temp 97.7°F | Ht 65.0 in | Wt 155.4 lb

## 2019-10-15 DIAGNOSIS — N6092 Unspecified benign mammary dysplasia of left breast: Secondary | ICD-10-CM

## 2019-10-15 DIAGNOSIS — Z803 Family history of malignant neoplasm of breast: Secondary | ICD-10-CM

## 2019-10-15 NOTE — Patient Instructions (Addendum)
Dr.Rodenberg discussed with patient about the next steps of having an appointment with Genetic Counseling. Patient will have a repeat mammogram in six months to follow up with our office.  Zenon Mayo from [facility] will contact patient to get her scheduled with Genetic Counseling.   Genetic Counseling Genetic counseling is a meeting that helps you understand certain conditions that can be passed from parent to child (inherited). These conditions may be related to:  The units that carry inherited information (genes).  The structures that carry genes (chromosomes). During the meeting, you will talk with a specialist who is trained in inherited conditions (geneticist or genetic counselor). Genetic counseling will help you understand your risk for:  Genetic disorders. These are conditions or diseases that are caused by changes (mutations) in genes.  Chromosomal disorders. These are conditions or diseases that are caused by a change in one or more chromosomes. Who should have genetic counseling? People who have children, are pregnant, or wish to become pregnant You may benefit from genetic counseling if you:  Have a child with a genetic or chromosomal condition.  Are concerned about passing an inherited condition to your child.  Have had two or more failed pregnancies (miscarriages) or stillbirths and you wish to become pregnant again.  Are pregnant and you are 40 years of age or older. This is when the risk of chromosomal changes increases.  Are pregnant and at risk for having a child with a genetic or chromosomal condition. People who have genetic conditions or want to know about their risk You may benefit from genetic counseling if you:  Have a condition that is genetic, chromosomal, or inherited.  Wish to know more about your risk for inherited conditions. These include: ? Heart disease. ? Cancer. ? Blood disorders. ? Mental illness.  Wish to know how lifestyle choices will  affect a genetic risk that you have.  Want to understand or respond to the results of genetic testing.  Want to know more about your risk for conditions that are common among your ethnic group. How is genetic counseling done? Genetic counseling is usually the first step a person takes as they are deciding to have genetic testing. Genetic counseling can help you understand the risks and benefits of genetic testing as well as what to expect if tests are done. Genetic counseling can also take place after genetic testing to help you understand the results. Before the session   Find out if counseling and testing are covered under your health insurance plan.  Make sure that you have all recommended screenings or tests.  Gather your personal medical records.  Bring information about your family's medical history.  Write down any questions you may have. During the session Plan to talk about:  Your family and personal medical history.  Possible patterns of inherited conditions in your family.  Choices that you have for genetic testing, or the results of genetic tests that you have already done.  Results of tests for any genetic conditions.  The meaning of a diagnosis and options for next steps.  Strategies for preventing, identifying, or managing genetic conditions.  Resources for further information, support, or care.  How you may share results with others in your family. After the session  You will receive a letter summarizing the information that you discussed with your genetic counselor.  You may have genetic testing and a follow-up visit to review your results.  Depending on the results of your genetic counseling, you may be referred  to specialists or other resources for support and information.  Keep all follow-up visits as told by your genetic counselor. Use these follow-up visits to get more information or support. What are the benefits of genetic counseling? All  information discussed during genetic counseling is kept private and confidential. Genetic counseling can:  Explain the diagnosis of a condition.  Tell you about your risk of developing certain conditions, such as cancer.  Help you understand patterns of conditions that may be inherited in your family.  Help you find resources for coping with a diagnosis.  Help you plan for your next steps. This may include referrals to specialists. What are the risks of genetic counseling? There are no risks directly related to genetic counseling. However, if you choose to have genetic testing, you may experience:  Financial, social, or emotional impact of knowing the results of genetic testing.  Anxiety or guilt if results could have an impact on your pregnancy or children.  The impact on your life, including decisions to have a family.  Genetic testing for conditions that have no cure or treatment, such as Alzheimer's disease. Talk with your health care provider to understand the risks and benefits of genetic counseling so that you can make a decision that is right for you and your family. Questions to ask your health care provider  How can I be referred for genetic counseling?  How do I prepare for genetic counseling?  What are the benefits of genetic counseling?  Will my insurance cover genetic counseling?  Are there any resources that can help me learn more? Where to find more information  [employer] of Genetic Counselors: ArtistMovie.se  NIH Genetics Home Reference: https://www.fletcher.com/ Summary  Genetic counseling is a meeting with a health care professional who specializes in conditions that can be passed from parent to child (inherited). This specialist is called a Physiological scientist or Dietitian.  Genetic counseling is usually the first step a person takes as they are deciding to have genetic testing.  Information that is obtained from genetic counseling is private and  confidential.  Genetic counseling can help you understand the risks and benefits of genetic testing as well as what to expect if tests are done. This information is not intended to replace advice given to you by your health care provider. Make sure you discuss any questions you have with your health care provider. Document Revised: 09/30/2017 Document Reviewed: 09/30/2017 Elsevier Patient Education  2020 [employer].

## 2019-10-15 NOTE — Progress Notes (Signed)
[facility] POST-OP OFFICE VISIT  10/15/2019  HPI: Tammy Acevedo is a 49 y.o. female 8 days s/p needle localized left breast biopsy.  Her report is consistent with the expected tenderness and bruising after any lumpectomy.  Vital signs: BP 127/78   Pulse 69   Temp 97.7 F (36.5 C) (Temporal)   Ht 5\' 5"  (1.651 m)   Wt 155 lb 6.4 oz (70.5 kg)   SpO2 98%   BMI 25.86 kg/m    Physical Exam: Constitutional: She looks well, appears happy smiling.  She is already aware of her pathology report. Breast: No distortion, no edema or induration.  Minimal to no ecchymotic discoloration. Skin: Incision clean dry and intact with Dermabond in place.  Assessment/Plan: This is a 49 y.o. female 8 days s/p needle localized left breast lumpectomy for focal atypical ductal hyperplasia on core biopsy, no additional ADH found on excision.  Patient Active Problem List   Diagnosis Date Noted  . 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 Transaminitis 12/31/2018  . Gastroesophageal reflux disease without esophagitis 12/17/2017  . Anxiety 12/17/2017  . SVT (supraventricular tachycardia) (HCC) 04/04/2017  . Bilateral carpal tunnel syndrome 03/20/2012    -Due to her family history, and this focal finding, we discussed the pros and cons of proceeding with chemoprevention/risk reduction of breast cancer.  She would like to discuss her genetic history with a 03/22/2012 and also pursue risk reduction strategies with the same. She may benefit from referral to an oncologist regarding risk reduction prescriptions. We discussed follow-up with Dentist in 6 months with repeat diagnostic mammogram of the left breast.   [place] M.D., [facility] 10/15/2019, 10:09 AM

## 2019-10-20 ENCOUNTER — Other Ambulatory Visit: Payer: Self-pay | Admitting: Surgery

## 2019-10-20 DIAGNOSIS — Z803 Family history of malignant neoplasm of breast: Secondary | ICD-10-CM

## 2019-10-20 DIAGNOSIS — N6092 Unspecified benign mammary dysplasia of left breast: Secondary | ICD-10-CM

## 2019-10-23 ENCOUNTER — Other Ambulatory Visit: Payer: Self-pay

## 2019-10-23 ENCOUNTER — Inpatient Hospital Stay: Payer: 59 | Attending: Oncology | Admitting: Oncology

## 2019-10-23 ENCOUNTER — Encounter: Payer: Self-pay | Admitting: Oncology

## 2019-10-23 VITALS — BP 111/79 | HR 60 | Temp 98.5°F | Ht 65.0 in | Wt 154.0 lb

## 2019-10-23 DIAGNOSIS — Z803 Family history of malignant neoplasm of breast: Secondary | ICD-10-CM | POA: Diagnosis not present

## 2019-10-23 DIAGNOSIS — N6092 Unspecified benign mammary dysplasia of left breast: Secondary | ICD-10-CM | POA: Diagnosis present

## 2019-10-23 NOTE — Progress Notes (Signed)
Patient is here today to establish care for her left breast cancer. Patient was due for her mammogram and they found something. She then had it biopsied and was told that she had atypical ductal hyperplasia on her left breast. Patient has a maternal aunt with history of breast cancer.

## 2019-10-26 ENCOUNTER — Encounter: Payer: Self-pay | Admitting: Oncology

## 2019-10-26 NOTE — Progress Notes (Signed)
Hematology/Oncology Consult note [facility] Telephone:([phone] [phone]  Patient Care Team: Glean Hess, MD as PCP - General (Internal Medicine) Wende Bushy, MD as Referring Physician (Cardiology)   Name of the patient: Tammy Acevedo  [id number]  March 12, 1971    Reason for referral-high risk breast disease   Referring physician-Dr. Luther Bradley  Date of visit: 10/26/19   History of presenting illness- Patient is a 49 year old premenopausal female with no prior significant medical history for breast cancer or breast biopsies.  She recently underwent a screening mammogram in October 2020 with which showed possible architectural distortion in the left breast as well as a possible mass in the right breast.  Ultrasound of the right breast showed a benign cyst in the right breast.  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  Patient was seen by Dr. Christian Mate and she underwent a lumpectomy which showed a radial scar with florid ductal hyperplasia and sclerosing adenosis.  Normal residual atypical proliferative breast disease or evidence of DCIS or invasive malignancy.  Margins were negative patient has been referred to [place] for chemoprevention counseling    ECOG PS- 0  Pain scale- 0   Review of systems- Review of Systems  Constitutional: Negative for chills, fever, malaise/fatigue and weight loss.  HENT: Negative for congestion, ear discharge and nosebleeds.   Eyes: Negative for blurred vision.  Respiratory: Negative for cough, hemoptysis, sputum production, shortness of breath and wheezing.   Cardiovascular: Negative for chest pain, palpitations, orthopnea and claudication.  Gastrointestinal: Negative for abdominal pain, blood in stool, constipation, diarrhea, heartburn, melena, nausea and vomiting.  Genitourinary:  Negative for dysuria, flank pain, frequency, hematuria and urgency.  Musculoskeletal: Negative for back pain, joint pain and myalgias.  Skin: Negative for rash.  Neurological: Negative for dizziness, tingling, focal weakness, seizures, weakness and headaches.  Endo/Heme/Allergies: Does not bruise/bleed easily.  Psychiatric/Behavioral: Negative for depression and suicidal ideas. The patient does not have insomnia.     No Known Allergies  Patient Active Problem List   Diagnosis Date Noted  . Family history of breast cancer in female 10/15/2019  . 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 Transaminitis 12/31/2018  . Gastroesophageal reflux disease without esophagitis 12/17/2017  . Anxiety 12/17/2017  . SVT (supraventricular tachycardia) ([place]) 04/04/2017  . Bilateral carpal tunnel syndrome 03/20/2012     Past Medical History:  Diagnosis Date  . Hypertension   . SVT (supraventricular tachycardia) (HCC)      Past Surgical History:  Procedure Laterality Date  . BREAST BIOPSY Left 08/11/2019   distortion, coil marker, ADH  . BREAST BIOPSY Left 10/07/2019   Procedure: BREAST BIOPSY WITH NEEDLE LOCALIZATION;  Surgeon: Ronny Bacon, MD;  Location: [facility] ORS;  Service: General;  Laterality: Left;  . BREAST LUMPECTOMY Left 10/07/2019   ADH excisied  . CARPAL TUNNEL RELEASE    . CHOLECYSTECTOMY    . TUBAL LIGATION      Social History   Socioeconomic History  . Marital status: Married    Spouse name: Not on file  . Number of children: 2  . Years of education: Not on file  . Highest education level: Not on file  Occupational History  . Not on file  Tobacco Use  . Smoking status: Never Smoker  . Smokeless tobacco: Never Used  Substance and Sexual Activity  . Alcohol use: No  Alcohol/week: 0.0 standard drinks  . Drug use: No  . Sexual activity: Not Currently  Other Topics Concern  . Not on file  Social History Narrative  . Not on file   Social  Determinants of Health   Financial Resource Strain:   . Difficulty of Paying Living Expenses: Not on file  Food Insecurity:   . Worried About Charity fundraiser in the Last Year: Not on file  . Ran Out of Food in the Last Year: Not on file  Transportation Needs:   . Lack of Transportation (Medical): Not on file  . Lack of Transportation (Non-Medical): Not on file  Physical Activity:   . Days of Exercise per Week: Not on file  . Minutes of Exercise per Session: Not on file  Stress:   . Feeling of Stress : Not on file  Social Connections:   . Frequency of Communication with Friends and Family: Not on file  . Frequency of Social Gatherings with Friends and Family: Not on file  . Attends Religious Services: Not on file  . Active Member of Clubs or Organizations: Not on file  . Attends Archivist Meetings: Not on file  . Marital Status: Not on file  Intimate Partner Violence:   . Fear of Current or Ex-Partner: Not on file  . Emotionally Abused: Not on file  . Physically Abused: Not on file  . Sexually Abused: Not on file     Family History  Problem Relation Age of Onset  . Diabetes Mother   . COPD Father   . Breast cancer Maternal Aunt 74       then at 57      Current Outpatient Medications:  .  metoprolol tartrate (LOPRESSOR) 25 MG tablet, Take 1 tablet (25 mg) by mouth twice daily (Patient taking differently: Take 25 mg by mouth 2 (two) times daily. ), Disp: 180 tablet, Rfl: 2 .  acetaminophen (TYLENOL) 500 MG tablet, Take 1,000 mg by mouth every 6 (six) hours as needed for moderate pain. , Disp: , Rfl:    Physical exam:  Vitals:   10/23/19 0932  BP: 111/79  Pulse: 60  Temp: 98.5 F (36.9 C)  TempSrc: Tympanic  Weight: 154 lb (69.9 kg)  Height: '5\' 5"'  (1.651 m)   Physical Exam Constitutional:      General: She is not in acute distress. HENT:     Head: Normocephalic and atraumatic.  Eyes:     Pupils: Pupils are equal, round, and reactive to light.    Cardiovascular:     Rate and Rhythm: Normal rate and regular rhythm.     Heart sounds: Normal heart sounds.  Pulmonary:     Effort: Pulmonary effort is normal.     Breath sounds: Normal breath sounds.  Abdominal:     General: Bowel sounds are normal.     Palpations: Abdomen is soft.  Musculoskeletal:     Cervical back: Normal range of motion.  Skin:    General: Skin is warm and dry.  Neurological:     Mental Status: She is alert and oriented to person, place, and time.    Breast exam was performed in seated and lying down position. Patient is status post right lumpectomy with a well-healed surgical scar. No evidence of any palpable masses. No evidence of axillary adenopathy. No evidence of any palpable masses or lumps in the left breast. No evidence of leftt axillary adenopathy     CMP Latest Ref Rng &  Units 09/29/2019  Glucose 65 - 99 mg/dL 88  BUN 6 - 24 mg/dL 9  Creatinine 0.57 - 1.00 mg/dL 0.83  Sodium 134 - 144 mmol/L 139  Potassium 3.5 - 5.2 mmol/L 4.3  Chloride 96 - 106 mmol/L 104  CO2 20 - 29 mmol/L 23  Calcium 8.7 - 10.2 mg/dL 8.9  Total Protein 6.0 - 8.5 g/dL 6.7  Total Bilirubin 0.0 - 1.2 mg/dL 0.5  Alkaline Phos 39 - 117 IU/L 78  AST 0 - 40 IU/L 15  ALT 0 - 32 IU/L 11   CBC Latest Ref Rng & Units 09/29/2019  WBC 3.4 - 10.8 x10E3/uL 6.3  Hemoglobin 11.1 - 15.9 g/dL 12.9  Hematocrit 34.0 - 46.6 % 38.0  Platelets 150 - 450 x10E3/uL 230    No images are attached to the encounter.  MM Breast Surgical Specimen  Result Date: 10/07/2019 CLINICAL DATA:  Biopsy proven radial scar with focal atypical ductal hyperplasia. Status post surgical excision. EXAM: SPECIMEN RADIOGRAPH OF THE LEFT BREAST COMPARISON:  Previous exam(s). FINDINGS: Status post excision of the left breast. The wire tip and biopsy marker clip are present and are marked for pathology. IMPRESSION: Specimen radiograph of the left breast. Electronically Signed   By: Lillia Mountain M.D.   On: 10/07/2019  12:14   MM LT PLC BREAST LOC DEV   1ST LESION  INC MAMMO GUIDE  Result Date: 10/07/2019 CLINICAL DATA:  Biopsy proven radial scar with focal atypical ductal hyperplasia. Localization prior to surgery. EXAM: NEEDLE LOCALIZATION OF THE LEFT BREAST WITH MAMMO GUIDANCE COMPARISON:  Previous exams. FINDINGS: Patient presents for needle localization prior to surgery. I met with the patient and we discussed the procedure of needle localization including benefits and alternatives. We discussed the high likelihood of a successful procedure. We discussed the risks of the procedure, including infection, bleeding, tissue injury, and further surgery. Informed, written consent was given. The usual time-out protocol was performed immediately prior to the procedure. Using mammographic guidance, sterile technique, 1% lidocaine and a #7 modified Kopans needle, the coil shaped clip localized using lateral to medial approach. The images were marked for Dr. Christian Mate. IMPRESSION: Needle localization of the left breast. No apparent complications. Electronically Signed   By: Lillia Mountain M.D.   On: 10/07/2019 08:31    Assessment and plan- Patient is a 49 y.o. female with newly diagnosed radial scar of the left breast with focus of atypical ductal hyperplasia s/p lumpectomy  I discussed the mammogram findings as well as the results from initial biopsy and lumpectomy.  Patient was found to have a focus of atypical ductal hyperplasia on her initial biopsy which also showed findings of radial scar.  Patient was seen by Dr. Christian Mate and underwent a lumpectomy.  There is no focus of atypical proliferative disease noted on final pathology and the radial scar was completely excised.  Discussed with the patient that chemoprevention as such is not required for radial scar.  However the fact that she did have a focus of atypical ductal hyperplasia on her initial biopsy-does make her a suitable candidate for chemoprevention.  Also her  calculated Gail score is 4.3% as compared to the average score of 1.3% for her age.  Given both of these factors it would be reasonable to consider tamoxifen for 5 years as chemoprevention.  Tamoxifen would be her only reasonable choice given that she is premenopausal.  Discussed that tamoxifen reduces the chance of invasive cancer in about 7 in1000 women over 5  years.  No significant change in overall survival as compared to placebo. Significant reduction in the incidence of known vertebral fractures 3 cases in thousand women. Also discussed potential risks of tamoxifen including all but not limited to fatigue, hot flashes, mood swings, risk of DVT and uterine cancer (4 in 1000 women).  Written information about tamoxifen has been given to the patient.  Patient would like to think about it and get back to [place] if she wants to start on the medication or not.  I will tentatively see her back in 6 months time but potentially sooner if she decides to go on tamoxifen  I will also refer the patient to genetic counseling   Thank you for this kind referral and the opportunity to participate in the care of this patient   Visit Diagnosis 1. Atypical ductal hyperplasia of left breast   2. Family history of breast cancer in female     Dr. Randa Evens, MD, MPH [facility] [phone] 10/26/2019  9:17 AM

## 2019-10-27 ENCOUNTER — Telehealth: Payer: Self-pay

## 2019-10-27 ENCOUNTER — Other Ambulatory Visit: Payer: Self-pay

## 2019-10-27 DIAGNOSIS — Z1211 Encounter for screening for malignant neoplasm of colon: Secondary | ICD-10-CM

## 2019-10-27 MED ORDER — NA SULFATE-K SULFATE-MG SULF 17.5-3.13-1.6 GM/177ML PO SOLN: 354.0000 mL | Freq: Once | ORAL | 0 refills | Status: AC

## 2019-10-27 NOTE — Telephone Encounter (Signed)
Gastroenterology Pre-Procedure Review    PATIENT REVIEW QUESTIONS: The patient responded to the following health history questions as indicated:    1. Are you having any GI issues? no 2. Do you have a personal history of Polyps? no 3. Do you have a family history of Colon Cancer or Polyps? no 4. Diabetes Mellitus? no 5. Joint replacements in the past 12 months?no 6. Major health problems in the past 3 months?no 7. Any artificial heart valves, MVP, or defibrillator?no    MEDICATIONS & ALLERGIES:    Patient reports the following regarding taking any anticoagulation/antiplatelet therapy:   Plavix, Coumadin, Eliquis, Xarelto, Lovenox, Pradaxa, Brilinta, or Effient? no Aspirin? no  Patient confirms/reports the following medications:  Current Outpatient Medications  Medication Sig Dispense Refill  . acetaminophen (TYLENOL) 500 MG tablet Take 1,000 mg by mouth every 6 (six) hours as needed for moderate pain.     . metoprolol tartrate (LOPRESSOR) 25 MG tablet Take 1 tablet (25 mg) by mouth twice daily (Patient taking differently: Take 25 mg by mouth 2 (two) times daily. ) 180 tablet 2   No current facility-administered medications for this visit.    Patient confirms/reports the following allergies:  No Known Allergies  No orders of the defined types were placed in this encounter.   AUTHORIZATION INFORMATION Primary Insurance: 1D#: Group #:  Secondary Insurance: 1D#: Group #:  SCHEDULE INFORMATION: Date: 12/07/2019 Time: Location:[place]

## 2019-11-26 ENCOUNTER — Encounter: Payer: Self-pay | Admitting: Gastroenterology

## 2019-12-03 ENCOUNTER — Other Ambulatory Visit
Admission: RE | Admit: 2019-12-03 | Discharge: 2019-12-03 | Disposition: A | Discharge status: Home / Self Care | Payer: 59 | Source: Ambulatory Visit | Attending: Gastroenterology | Admitting: Gastroenterology

## 2019-12-03 DIAGNOSIS — Z01812 Encounter for preprocedural laboratory examination: Secondary | ICD-10-CM | POA: Insufficient documentation

## 2019-12-03 DIAGNOSIS — Z20822 Contact with and (suspected) exposure to covid-19: Secondary | ICD-10-CM | POA: Diagnosis not present

## 2019-12-04 LAB — SARS CORONAVIRUS 2 (TAT 6-24 HRS): SARS Coronavirus 2: NEGATIVE

## 2019-12-07 ENCOUNTER — Ambulatory Visit: Payer: 59 | Admitting: Anesthesiology

## 2019-12-07 ENCOUNTER — Encounter: Payer: Self-pay | Admitting: Gastroenterology

## 2019-12-07 ENCOUNTER — Ambulatory Visit
Admission: RE | Admit: 2019-12-07 | Discharge: 2019-12-07 | Disposition: A | Discharge status: Home / Self Care | Payer: 59 | Attending: Gastroenterology | Admitting: Gastroenterology

## 2019-12-07 ENCOUNTER — Other Ambulatory Visit: Payer: Self-pay

## 2019-12-07 ENCOUNTER — Encounter
Admission: RE | Disposition: A | Discharge status: Home / Self Care | Payer: Self-pay | Source: Home / Self Care | Attending: Gastroenterology

## 2019-12-07 DIAGNOSIS — Z1211 Encounter for screening for malignant neoplasm of colon: Secondary | ICD-10-CM | POA: Diagnosis present

## 2019-12-07 DIAGNOSIS — I10 Essential (primary) hypertension: Secondary | ICD-10-CM | POA: Diagnosis not present

## 2019-12-07 DIAGNOSIS — Z79899 Other long term (current) drug therapy: Secondary | ICD-10-CM | POA: Diagnosis not present

## 2019-12-07 DIAGNOSIS — I471 Supraventricular tachycardia: Secondary | ICD-10-CM | POA: Insufficient documentation

## 2019-12-07 HISTORY — PX: COLONOSCOPY WITH PROPOFOL: SHX5780

## 2019-12-07 LAB — POCT PREGNANCY, URINE: Preg Test, Ur: NEGATIVE

## 2019-12-07 SURGERY — COLONOSCOPY WITH PROPOFOL
Anesthesia: General | Site: Rectum

## 2019-12-07 MED ORDER — LIDOCAINE HCL (CARDIAC) PF 100 MG/5ML IV SOSY
PREFILLED_SYRINGE | INTRAVENOUS | Status: DC | PRN
  Administered 2019-12-07: 20 mg via INTRAVENOUS

## 2019-12-07 MED ORDER — STERILE WATER FOR IRRIGATION IR SOLN
Status: DC | PRN
  Administered 2019-12-07: 50 mL

## 2019-12-07 MED ORDER — PROPOFOL 10 MG/ML IV BOLUS
INTRAVENOUS | Status: DC | PRN
  Administered 2019-12-07: 30 mg via INTRAVENOUS
  Administered 2019-12-07: 80 mg via INTRAVENOUS
  Administered 2019-12-07 (×3): 20 mg via INTRAVENOUS

## 2019-12-07 MED ORDER — LACTATED RINGERS IV SOLN: INTRAVENOUS | Status: DC

## 2019-12-07 SURGICAL SUPPLY — 5 items
CANISTER SUCT 1200ML W/VALVE (MISCELLANEOUS) ×2 IMPLANT
GOWN CVR UNV OPN BCK APRN NK (MISCELLANEOUS) ×2 IMPLANT
GOWN ISOL THUMB LOOP REG UNIV (MISCELLANEOUS) ×2
KIT ENDO PROCEDURE OLY (KITS) ×2 IMPLANT
WATER STERILE IRR 250ML POUR (IV SOLUTION) ×2 IMPLANT

## 2019-12-07 NOTE — Transfer of Care (Signed)
Immediate Anesthesia Transfer of Care Note  Patient: Tammy Acevedo  Procedure(s) Performed: COLONOSCOPY WITH PROPOFOL (N/A Rectum)  Patient Location: PACU  Anesthesia Type: General  Level of Consciousness: awake, alert  and patient cooperative  Airway and Oxygen Therapy: Patient Spontanous Breathing and Patient connected to supplemental oxygen  Post-op Assessment: Post-op Vital signs reviewed, Patient's Cardiovascular Status Stable, Respiratory Function Stable, Patent Airway and No signs of Nausea or vomiting  Post-op Vital Signs: Reviewed and stable  Complications: No apparent anesthesia complications

## 2019-12-07 NOTE — Anesthesia Preprocedure Evaluation (Signed)
Anesthesia Evaluation  Patient identified by MRN, date of birth, ID band  Reviewed: Allergy & Precautions, NPO status , Patient's Chart, lab work & pertinent test results  Airway Mallampati: II  TM Distance: >3 FB     Dental   Pulmonary    Pulmonary exam normal        Cardiovascular hypertension, Normal cardiovascular exam     Neuro/Psych PSYCHIATRIC DISORDERS Anxiety    GI/Hepatic GERD  ,  Endo/Other    Renal/GU      Musculoskeletal   Abdominal   Peds  Hematology   Anesthesia Other Findings   Reproductive/Obstetrics                             Anesthesia Physical Anesthesia Plan  ASA: II  Anesthesia Plan: General   Post-op Pain Management:    Induction: Intravenous  PONV Risk Score and Plan:   Airway Management Planned: Natural Airway  Additional Equipment:   Intra-op Plan:   Post-operative Plan:   Informed Consent: I have reviewed the patients History and Physical, chart, labs and discussed the procedure including the risks, benefits and alternatives for the proposed anesthesia with the patient or authorized representative who has indicated his/her understanding and acceptance.       Plan Discussed with: CRNA, Anesthesiologist and Surgeon  Anesthesia Plan Comments:         Anesthesia Quick Evaluation

## 2019-12-07 NOTE — Op Note (Signed)
[facility] Gastroenterology Patient Name: Tammy Acevedo Procedure Date: 12/07/2019 10:03 AM MRN: [id number] Account #: [id number] Date of Birth: 04-19-1971 Admit Type: Outpatient Age: 49 Room: [facility] OR ROOM 01 Gender: Female Note Status: Finalized Procedure:             Colonoscopy Indications:           Screening for colorectal malignant neoplasm Providers:             Lucilla Lame MD, MD Referring MD:          Halina Maidens, MD (Referring MD) Medicines:             Propofol per Anesthesia Complications:         No immediate complications. Procedure:             Pre-Anesthesia Assessment:                        - Prior to the procedure, a History and Physical was                         performed, and patient medications and allergies were                         reviewed. The patient's tolerance of previous                         anesthesia was also reviewed. The risks and benefits                         of the procedure and the sedation options and risks                         were discussed with the patient. All questions were                         answered, and informed consent was obtained. Prior                         Anticoagulants: The patient has taken no previous                         anticoagulant or antiplatelet agents. ASA Grade                         Assessment: II - A patient with mild systemic disease.                         After reviewing the risks and benefits, the patient                         was deemed in satisfactory condition to undergo the                         procedure.                        After obtaining informed consent, the colonoscope was  passed under direct vision. Throughout the procedure,                         the patient's blood pressure, pulse, and oxygen                         saturations were monitored continuously. The was                         introduced through the anus and  advanced to the the                         cecum, identified by appendiceal orifice and ileocecal                         valve. The colonoscopy was performed without                         difficulty. The patient tolerated the procedure well.                         The quality of the bowel preparation was excellent. Findings:      The perianal and digital rectal examinations were normal.      The colon (entire examined portion) appeared normal. Impression:            - The entire examined colon is normal.                        - No specimens collected. Recommendation:        - Discharge patient to home.                        - Resume previous diet.                        - Continue present medications.                        - Repeat colonoscopy in 10 years for screening unless                         any change in family history or lower GI problems. Procedure Code(s):     --- Professional ---                        [phone], Colonoscopy, flexible; diagnostic, including                         collection of specimen(s) by brushing or washing, when                         performed (separate procedure) Diagnosis Code(s):     --- Professional ---                        Z12.11, Encounter for screening for malignant neoplasm                         of colon CPT copyright 2019 American Medical Association. All rights reserved.  The codes documented in this report are preliminary and upon coder review may  be revised to meet current compliance requirements. Midge Minium MD, MD 12/07/2019 10:26:18 AM This report has been signed electronically. Number of Addenda: 0 Note Initiated On: 12/07/2019 10:03 AM Scope Withdrawal Time: 0 hours 6 minutes 55 seconds  Total Procedure Duration: 0 hours 8 minutes 41 seconds  Estimated Blood Loss:  Estimated blood loss: none.      [facility]

## 2019-12-07 NOTE — Anesthesia Postprocedure Evaluation (Signed)
Anesthesia Post Note  Patient: Tammy Acevedo  Procedure(s) Performed: COLONOSCOPY WITH PROPOFOL (N/A Rectum)     Patient location during evaluation: PACU Anesthesia Type: General Level of consciousness: awake and alert Pain management: pain level controlled Vital Signs Assessment: post-procedure vital signs reviewed and stable Respiratory status: spontaneous breathing, nonlabored ventilation, respiratory function stable and patient connected to nasal cannula oxygen Cardiovascular status: blood pressure returned to baseline and stable Postop Assessment: no apparent nausea or vomiting Anesthetic complications: no    Gayland Curry Hawley [place]

## 2019-12-07 NOTE — Anesthesia Procedure Notes (Signed)
Procedure Name: MAC Performed by: Vanetta Shawl, CRNA Pre-anesthesia Checklist: Patient identified, Emergency Drugs available, Suction available, Timeout performed and Patient being monitored Patient Re-evaluated:Patient Re-evaluated prior to induction Oxygen Delivery Method: Nasal cannula Placement Confirmation: positive ETCO2

## 2019-12-07 NOTE — H&P (Signed)
Midge Minium, MD [facility] [address] [place], [place] [address] Phone: [phone] Fax : [phone]  Primary Care Physician:  Reubin Milan, MD Primary Gastroenterologist:  Dr. Servando Snare  Pre-Procedure History & Physical: HPI:  Tammy Acevedo is a 49 y.o. female is here for a screening colonoscopy.   Past Medical History:  Diagnosis Date  . Hypertension   . SVT (supraventricular tachycardia) (HCC)     Past Surgical History:  Procedure Laterality Date  . BREAST BIOPSY Left 08/11/2019   distortion, coil marker, ADH  . BREAST BIOPSY Left 10/07/2019   Procedure: BREAST BIOPSY WITH NEEDLE LOCALIZATION;  Surgeon: Campbell Lerner, MD;  Location: [facility] ORS;  Service: General;  Laterality: Left;  . BREAST LUMPECTOMY Left 10/07/2019   ADH excisied  . CARPAL TUNNEL RELEASE    . CHOLECYSTECTOMY    . TUBAL LIGATION      Prior to Admission medications   Medication Sig Start Date End Date Taking? Authorizing Provider  acetaminophen (TYLENOL) 500 MG tablet Take 1,000 mg by mouth every 6 (six) hours as needed for moderate pain.    Yes [provider]  cholecalciferol (VITAMIN D3) 25 MCG (1000 UNIT) tablet Take 1,000 Units by mouth daily.   Yes [provider]  ELDERBERRY PO Take by mouth daily.   Yes [provider]  metoprolol tartrate (LOPRESSOR) 25 MG tablet Take 1 tablet (25 mg) by mouth twice daily Patient taking differently: Take 25 mg by mouth 2 (two) times daily.  07/01/19  Yes Antonieta Iba, MD    Allergies as of 10/27/2019  . (No Known Allergies)    Family History  Problem Relation Age of Onset  . Diabetes Mother   . COPD Father   . Breast cancer Maternal Aunt 63       then at 89     Social History   Socioeconomic History  . Marital status: Married    Spouse name: Not on file  . Number of children: 2  . Years of education: Not on file  . Highest education level: Not on file  Occupational History  . Not on file    Tobacco Use  . Smoking status: Never Smoker  . Smokeless tobacco: Never Used  Substance and Sexual Activity  . Alcohol use: No    Alcohol/week: 0.0 standard drinks  . Drug use: No  . Sexual activity: Not Currently  Other Topics Concern  . Not on file  Social History Narrative  . Not on file   Social Determinants of Health   Financial Resource Strain:   . Difficulty of Paying Living Expenses: Not on file  Food Insecurity:   . Worried About Programme researcher, broadcasting/film/video in the Last Year: Not on file  . Ran Out of Food in the Last Year: Not on file  Transportation Needs:   . Lack of Transportation (Medical): Not on file  . Lack of Transportation (Non-Medical): Not on file  Physical Activity:   . Days of Exercise per Week: Not on file  . Minutes of Exercise per Session: Not on file  Stress:   . Feeling of Stress : Not on file  Social Connections:   . Frequency of Communication with Friends and Family: Not on file  . Frequency of Social Gatherings with Friends and Family: Not on file  . Attends Religious Services: Not on file  . Active Member of Clubs or Organizations: Not on file  . Attends Banker Meetings: Not on  file  . Marital Status: Not on file  Intimate Partner Violence:   . Fear of Current or Ex-Partner: Not on file  . Emotionally Abused: Not on file  . Physically Abused: Not on file  . Sexually Abused: Not on file    Review of Systems: See HPI, otherwise negative ROS  Physical Exam: BP 122/60   Pulse (!) 54   Temp (!) 97.3 F (36.3 C) (Temporal)   Resp 18   Ht 5\' 5"  (1.651 m)   Wt 68.5 kg   LMP 11/02/2019   SpO2 100%   BMI 25.13 kg/m  General:   Alert,  pleasant and cooperative in NAD Head:  Normocephalic and atraumatic. Neck:  Supple; no masses or thyromegaly. Lungs:  Clear throughout to auscultation.    Heart:  Regular rate and rhythm. Abdomen:  Soft, nontender and nondistended. Normal bowel sounds, without guarding, and without rebound.    Neurologic:  Alert and  oriented x4;  grossly normal neurologically.  Impression/Plan: Tammy Acevedo is now here to undergo a screening colonoscopy.  Risks, benefits, and alternatives regarding colonoscopy have been reviewed with the patient.  Questions have been answered.  All parties agreeable.

## 2019-12-08 ENCOUNTER — Encounter: Payer: Self-pay | Admitting: *Deleted

## 2020-01-06 ENCOUNTER — Other Ambulatory Visit: Payer: Self-pay

## 2020-01-06 ENCOUNTER — Encounter: Payer: Self-pay | Admitting: Nurse Practitioner

## 2020-01-06 ENCOUNTER — Ambulatory Visit (INDEPENDENT_AMBULATORY_CARE_PROVIDER_SITE_OTHER): Payer: 59 | Admitting: Nurse Practitioner

## 2020-01-06 VITALS — BP 118/64 | HR 67 | Ht 66.0 in | Wt 154.0 lb

## 2020-01-06 DIAGNOSIS — I471 Supraventricular tachycardia: Secondary | ICD-10-CM | POA: Diagnosis not present

## 2020-01-06 DIAGNOSIS — I10 Essential (primary) hypertension: Secondary | ICD-10-CM

## 2020-01-06 DIAGNOSIS — K30 Functional dyspepsia: Secondary | ICD-10-CM | POA: Diagnosis not present

## 2020-01-06 NOTE — Patient Instructions (Signed)
Medication Instructions:  Your physician recommends that you continue on your current medications as directed. Please refer to the Current Medication list given to you today.  *If you need a refill on your cardiac medications before your next appointment, please call your pharmacy*   Lab Work: None ordered  If you have labs (blood work) drawn today and your tests are completely normal, you will receive your results only by: Marland Kitchen MyChart Message (if you have MyChart) OR . A paper copy in the mail If you have any lab test that is abnormal or we need to change your treatment, we will call you to review the results.   Testing/Procedures: None ordered    Follow-Up: At [facility], you and your health needs are our priority.  As part of our continuing mission to provide you with exceptional heart care, we have created designated Provider Care Teams.  These Care Teams include your primary Cardiologist (physician) and Advanced Practice Providers (APPs -  Physician Assistants and Nurse Practitioners) who all work together to provide you with the care you need, when you need it.  We recommend signing up for the patient portal called "MyChart".  Sign up information is provided on this After Visit Summary.  MyChart is used to connect with patients for Virtual Visits (Telemedicine).  Patients are able to view lab/test results, encounter notes, upcoming appointments, etc.  Non-urgent messages can be sent to your provider as well.   To learn more about what you can do with MyChart, go to [email or web address].    Your next appointment:   12 month(s)  The format for your next appointment:   In Person  Provider:   Julien Nordmann, MD

## 2020-01-06 NOTE — Progress Notes (Signed)
Office Visit    Patient Name: Tammy Acevedo Date of Encounter: 01/06/2020  Primary Care Provider:  Glean Hess, MD Primary Cardiologist:  Ida Rogue, MD  Chief Complaint    49 year old female with a history of SVT, chest pain, hypertension, and mild mitral regurgitation, who presents for follow-up related to SVT.  Past Medical History    Past Medical History:  Diagnosis Date  . Chest pain    a. 08/2015 ETT: Ex time 9:01, Max HR 166, No ECG changes->Nl study.  . Hypertension   . Mild Mitral regurgitation    a. 08/2015 Echo: EF 50%, no rwma, Triv TR/PR, mild MR.  . SVT (supraventricular tachycardia) ([place])    Past Surgical History:  Procedure Laterality Date  . BREAST BIOPSY Left 08/11/2019   distortion, coil marker, ADH  . BREAST BIOPSY Left 10/07/2019   Procedure: BREAST BIOPSY WITH NEEDLE LOCALIZATION;  Surgeon: Ronny Bacon, MD;  Location: [facility] ORS;  Service: General;  Laterality: Left;  . BREAST LUMPECTOMY Left 10/07/2019   ADH excisied  . CARPAL TUNNEL RELEASE    . CHOLECYSTECTOMY    . COLONOSCOPY WITH PROPOFOL N/A 12/07/2019   Procedure: COLONOSCOPY WITH PROPOFOL;  Surgeon: Lucilla Lame, MD;  Location: [facility];  Service: Endoscopy;  Laterality: N/A;  . TUBAL LIGATION      Allergies  No Known Allergies  History of Present Illness    49 year old female with the above past medical history including supraventricular tachycardia, chest pain, hypertension, and mild mitral regurgitation.  She was previously evaluated at [facility] clinic in 2016 in the setting of chest pain, with stress testing showing good exercise tolerance and no acute ST or T changes.  Echocardiogram at that time showed an EF of 50% without regional wall motion abnormalities.  Mild MR was noted.  Per our notes, she has not had SVT since 2017.  She was last seen in cardiology clinic in April 2020, at which time she reported one prolonged episode of SVT (12 minutes), but was  otherwise doing well.  Since her last visit, she was dx w/ a focal atypical ductal hyperplasia.  She was initially scheduled for lumpectomy in early December 2020 however, preoperative COVID-19 testing was positive.  Therefore, surgery was delayed.  Fortunately, she did not have any significant COVID-19 symptoms.  She subsequently underwent lumpectomy of the left breast in 10/2019.  No focus of atypical proliferative disease was noted on the final pathology and the radial scar was completely excised.  She was evaluated by oncology and offered chemoprevention with tamoxifen.  She is currently weighing her options.  From a cardiovascular standpoint, she has done well.  Sometimes she will experience a brief palpitation which she says feels like the beginning of an episode of SVT but it never goes more than a few beats.  Overall, she feels as though metoprolol therapy has helped significantly.  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  There is a fair amount of exertion with her work and she is able to carry out without difficulty.  She occasionally notes epigastric discomfort/indigestion associated with eating meals.  She does use Tums as needed and also has Prilosec OTC, but only uses this as needed as well.  Tums typically relieve epigastric symptoms.  She denies chest pain, dyspnea, PND, orthopnea, dizziness, syncope, edema, or early satiety.  Home Medications    Prior to Admission medications  Medication Sig Start Date End Date Taking? Authorizing Provider  acetaminophen (TYLENOL) 500 MG tablet Take 1,000 mg by mouth every 6 (six) hours as needed for moderate pain.     [provider]  cholecalciferol (VITAMIN D3) 25 MCG (1000 UNIT) tablet Take 1,000 Units by mouth daily.    [provider]  ELDERBERRY PO Take by mouth daily.    [provider]  metoprolol tartrate (LOPRESSOR) 25 MG tablet Take 1  tablet (25 mg) by mouth twice daily Patient taking differently: Take 25 mg by mouth 2 (two) times daily.  07/01/19   Antonieta Iba, MD    Review of Systems    Occasional brief palpitations but nothing sustained.  Indigestion occurring with meals about 3 times a week.  She denies chest pain, dyspnea, PND, orthopnea, dizziness, syncope, edema, or early satiety.  All other systems reviewed and are otherwise negative except as noted above.  Physical Exam    VS:  BP 118/64 (BP Location: Left Arm, Patient Position: Sitting, Cuff Size: Normal)   Pulse 67   Ht 5\' 6"  (1.676 m)   Wt 154 lb (69.9 kg)   SpO2 98%   BMI 24.86 kg/m  , BMI Body mass index is 24.86 kg/m. GEN: Well nourished, well developed, in no acute distress. HEENT: normal. Neck: Supple, no JVD, carotid bruits, or masses. Cardiac: RRR, no murmurs, rubs, or gallops. No clubbing, cyanosis, edema.  Radials/DP/PT 2+ and equal bilaterally.  Respiratory:  Respirations regular and unlabored, clear to auscultation bilaterally. GI: Soft, nontender, nondistended, BS + x 4. MS: no deformity or atrophy. Skin: warm and dry, no rash. Neuro:  Strength and sensation are intact. Psych: Normal affect.  Accessory Clinical Findings    ECG personally reviewed by me today -regular sinus rhythm, 67, no acute ST or T changes  Lab Results  Component Value Date   WBC 6.3 09/29/2019   HGB 12.9 09/29/2019   HCT 38.0 09/29/2019   MCV 86 09/29/2019   PLT 230 09/29/2019   Lab Results  Component Value Date   CREATININE 0.83 09/29/2019   BUN 9 09/29/2019   NA 139 09/29/2019   K 4.3 09/29/2019   CL 104 09/29/2019   CO2 23 09/29/2019   Lab Results  Component Value Date   ALT 11 09/29/2019   AST 15 09/29/2019   ALKPHOS 78 09/29/2019   BILITOT 0.5 09/29/2019   Lab Results  Component Value Date   CHOL 150 09/29/2019   HDL 47 09/29/2019   LDLCALC 87 09/29/2019   TRIG 86 09/29/2019   CHOLHDL 3.2 09/29/2019     Assessment & Plan      1.  PSVT: Relatively quiescent with only the occasional rare and brief palpitation.  Continue beta-blocker therapy.  2.  Essential hypertension: Stable on beta-blocker therapy.  3.  Indigestion: Patient notes mild epigastric burning that occurs with meals and is frequently relieved with Tums.  She does own Prilosec OTC but does not take it regularly.  I advised that given relatively frequent symptoms throughout the course of the week, that she might want to consider using an over-the-counter antacid on a daily basis.  She will consider taking prilosec more frequently or trying a less expensive H2 blocker, such as pepcid.  4.  Disposition: Follow-up in 1 year or sooner if necessary.  10/01/2019, NP 01/06/2020, 4:14 PM

## 2020-03-24 ENCOUNTER — Inpatient Hospital Stay: Payer: 59

## 2020-03-24 ENCOUNTER — Encounter: Payer: Self-pay | Admitting: Licensed Clinical Social Worker

## 2020-03-24 ENCOUNTER — Inpatient Hospital Stay: Payer: 59 | Attending: Oncology | Admitting: Licensed Clinical Social Worker

## 2020-03-24 ENCOUNTER — Other Ambulatory Visit: Payer: Self-pay

## 2020-03-24 DIAGNOSIS — Z803 Family history of malignant neoplasm of breast: Secondary | ICD-10-CM | POA: Diagnosis not present

## 2020-03-24 DIAGNOSIS — Z800 Family history of malignant neoplasm of digestive organs: Secondary | ICD-10-CM

## 2020-03-24 DIAGNOSIS — N6092 Unspecified benign mammary dysplasia of left breast: Secondary | ICD-10-CM | POA: Diagnosis not present

## 2020-03-24 NOTE — Progress Notes (Signed)
REFERRING PROVIDER: [name], MD [place],  [place] [address]  PRIMARY PROVIDER:  Glean Hess, MD  PRIMARY REASON FOR VISIT:  1. Atypical ductal hyperplasia of left breast   2. Family history of breast cancer   3. Family history of throat cancer      HISTORY OF PRESENT ILLNESS:   Ms. Tammy Acevedo, a 49 y.o. female, was seen for a [facility] cancer genetics consultation at the request of Dr. Janese Banks due to a family history of cancer.  Ms. Imler presents to clinic today to discuss the possibility of a hereditary predisposition to cancer, genetic testing, and to further clarify her future cancer risks, as well as potential cancer risks for family members.    Ms. Bushong is a 49 y.o. female with no personal history of cancer. She had a left breast lumpectomy for ADH in January.   CANCER HISTORY:  Oncology History   No history exists.     RISK FACTORS:  Menarche was at age 49.  First live birth at age 27.  OCP use for approximately 0 years.  Ovaries intact: yes.  Hysterectomy: no.  Menopausal status: perimenopausal.  HRT use: 0 years. Colonoscopy: yes; normal. Mammogram within the last year: yes. Number of breast biopsies: 1. Up to date with pelvic exams: yes. Any excessive radiation exposure in the past: no  Past Medical History:  Diagnosis Date  . Chest pain    a. 08/2015 ETT: Ex time 9:01, Max HR 166, No ECG changes->Nl study.  . Family history of breast cancer   . Family history of lung cancer   . Family history of throat cancer   . Hypertension   . Mild Mitral regurgitation    a. 08/2015 Echo: EF 50%, no rwma, Triv TR/PR, mild MR.  . SVT (supraventricular tachycardia) ([facility])     Past Surgical History:  Procedure Laterality Date  . BREAST BIOPSY Left 08/11/2019   distortion, coil marker, ADH  . BREAST BIOPSY Left 10/07/2019   Procedure: BREAST BIOPSY WITH NEEDLE LOCALIZATION;  Surgeon: Ronny Bacon, MD;  Location: [facility];  Service:  General;  Laterality: Left;  . BREAST LUMPECTOMY Left 10/07/2019   ADH excisied  . CARPAL TUNNEL RELEASE    . CHOLECYSTECTOMY    . COLONOSCOPY WITH PROPOFOL N/A 12/07/2019   Procedure: COLONOSCOPY WITH PROPOFOL;  Surgeon: Lucilla Lame, MD;  Location: [facility];  Service: Endoscopy;  Laterality: N/A;  . TUBAL LIGATION      Social History   Socioeconomic History  . Marital status: Married    Spouse name: Not on file  . Number of children: 2  . Years of education: Not on file  . Highest education level: Not on file  Occupational History  . Not on file  Tobacco Use  . Smoking status: Never Smoker  . Smokeless tobacco: Never Used  Vaping Use  . Vaping Use: Never used  Substance and Sexual Activity  . Alcohol use: No    Alcohol/week: 0.0 standard drinks  . Drug use: No  . Sexual activity: Not Currently  Other Topics Concern  . Not on file  Social History Narrative  . Not on file   Social Determinants of Health   Financial Resource Strain:   . Difficulty of Paying Living Expenses:   Food Insecurity:   . Worried About Charity fundraiser in the Last Year:   . Arboriculturist in the Last Year:   Transportation Needs:   .  Lack of Transportation (Medical):   Marland Kitchen Lack of Transportation (Non-Medical):   Physical Activity:   . Days of Exercise per Week:   . Minutes of Exercise per Session:   Stress:   . Feeling of Stress :   Social Connections:   . Frequency of Communication with Friends and Family:   . Frequency of Social Gatherings with Friends and Family:   . Attends Religious Services:   . Active Member of Clubs or Organizations:   . Attends Archivist Meetings:   Marland Kitchen Marital Status:      FAMILY HISTORY:  We obtained a detailed, 4-generation family history.  Significant diagnoses are listed below: Family History  Problem Relation Age of Onset  . Diabetes Mother   . COPD Father   . Lung cancer Father 48  . Breast cancer Maternal Aunt 74       dx  again at 32  . Cancer Paternal Aunt        jaw cancer dx 72s  . Breast cancer Cousin        dx 50s-60s  . Throat cancer Cousin    Ms. Hausmann has a son, Charlotte Crumb, 4 and daughter, Aldona Bar, 25. Patient has a brother, Trilby Drummer, 62 and sister, Tye Maryland, 1. None have had cancer.   Ms. Carollo mother was diagnosed with cervical cancer in her 21s-40s and had a hysterectomy, she is living at 53. Patient has 2 maternal aunts and 2 maternal uncles. One aunt had breast cancer at 18 and was diagnosed with another breast cancer at 21. This aunt had a daughter who died recently due to metastatic breast cancer diagnosed in her 30s-60s. Maternal grandmother possibly had cancer, passed in her 31s. Grandfather passed in his 52s as well due to a stroke.   Ms. Brundage father died at 61, he had a history of lung cancer and COPD. Patient had 7 maternal aunts/uncles and one aunt had jaw cancer in her 24s. No known cancers in paternal cousins. Paternal grandparents passed in their 60s, no cancers.  Ms. Shor is unaware of previous family history of genetic testing for hereditary cancer risks. Patient's maternal ancestors are of unknown descent, and paternal ancestors are of unknown descent. There is no reported Ashkenazi Jewish ancestry. There is no known consanguinity.  GENETIC COUNSELING ASSESSMENT: Ms. Dickard is a 49 y.o. female with a family history of breast cancer which is somewhat suggestive of a hereditary cancer syndrome and predisposition to cancer. We, therefore, discussed and recommended the following at today's visit.   DISCUSSION: We discussed that 5 - 10% of breast cancer is hereditary, with most cases associated with BRCA1/BRCA2 mutations.  There are other genes that can be associated with hereditary breast cancer syndromes.  These include PALB2, CHEK2, ATM.   We discussed that testing is beneficial for several reasons including knowing how to follow individuals for cancer screenings, and to understand  if other family members could be at risk for cancer and allow them to undergo genetic testing.   We reviewed the characteristics, features and inheritance patterns of hereditary cancer syndromes. We also discussed genetic testing, including the appropriate family members to test, the process of testing, insurance coverage and turn-around-time for results. We discussed the implications of a negative, positive and/or variant of uncertain significant result. We recommended Ms. Hogenson pursue genetic testing for the [facility] Common Hereditary Cancers gene panel.   The Common Hereditary Cancers Panel offered by [facility] includes sequencing and/or deletion duplication testing of the following 48 genes:  APC, ATM, AXIN2, BARD1, BMPR1A, BRCA1, BRCA2, BRIP1, CDH1, CDKN2A (p14ARF), CDKN2A (p16INK4a), CKD4, CHEK2, CTNNA1, DICER1, EPCAM (Deletion/duplication testing only), GREM1 (promoter region deletion/duplication testing only), KIT, MEN1, MLH1, MSH2, MSH3, MSH6, MUTYH, NBN, NF1, NHTL1, PALB2, PDGFRA, PMS2, POLD1, POLE, PTEN, RAD50, RAD51C, RAD51D, RNF43, SDHB, SDHC, SDHD, SMAD4, SMARCA4. STK11, TP53, TSC1, TSC2, and VHL.  The following genes were evaluated for sequence changes only: SDHA and HOXB13 c.251G>A variant only.  Based on Ms. Osgood's family history of cancer, she meets medical criteria for genetic testing. Despite that she meets criteria, she may still have an out of pocket cost. We discussed that if her out of pocket cost for testing is over $100, the laboratory will call and confirm whether she wants to proceed with testing.  If the out of pocket cost of testing is less than $100 she will be billed by the genetic testing laboratory.   PLAN: After considering the risks, benefits, and limitations, Ms. Waner provided informed consent to pursue genetic testing and the blood sample was sent to [facility] for analysis of the Common Hereditary Cancers Panel. Results should be available within  approximately 2-3 weeks' time, at which point they will be disclosed by telephone to Ms. Ozer, as will any additional recommendations warranted by these results. Ms. Vanosdol will receive a summary of her genetic counseling visit and a copy of her results once available. This information will also be available in Epic.   Based on Ms. Nazaire's family history, we recommended her maternal aunt have genetic counseling and testing. Ms. Bessler will let us know if we can be of any assistance in coordinating genetic counseling and/or testing for this family member.   Lastly, we encouraged Ms. Giammona to remain in contact with cancer genetics annually so that we can continuously update the family history and inform her of any changes in cancer genetics and testing that may be of benefit for this family.   Ms. Goldberg questions were answered to her satisfaction today. Our contact information was provided should additional questions or concerns arise. Thank you for the referral and allowing [place] to share in the care of your patient.   Faith Rogue, MS, [facility] Genetic Counselor [email or web address] Phone: [phone]  The patient was seen for a total of 30 minutes in face-to-face genetic counseling.  Drs. Magrinat, Lindi Adie and/or Burr Medico were available for discussion regarding this case.   _______________________________________________________________________ For Office Staff:  Number of people involved in session: 1 Was an Intern/ student involved with case: no

## 2020-04-06 ENCOUNTER — Encounter: Payer: Self-pay | Admitting: Licensed Clinical Social Worker

## 2020-04-06 ENCOUNTER — Telehealth: Payer: Self-pay | Admitting: Licensed Clinical Social Worker

## 2020-04-06 ENCOUNTER — Ambulatory Visit: Payer: Self-pay | Admitting: Licensed Clinical Social Worker

## 2020-04-06 DIAGNOSIS — Z1379 Encounter for other screening for genetic and chromosomal anomalies: Secondary | ICD-10-CM | POA: Insufficient documentation

## 2020-04-06 DIAGNOSIS — Z800 Family history of malignant neoplasm of digestive organs: Secondary | ICD-10-CM

## 2020-04-06 DIAGNOSIS — N6092 Unspecified benign mammary dysplasia of left breast: Secondary | ICD-10-CM

## 2020-04-06 DIAGNOSIS — Z803 Family history of malignant neoplasm of breast: Secondary | ICD-10-CM

## 2020-04-06 NOTE — Progress Notes (Signed)
HPI:  Ms. Biggs was previously seen in the [facility] clinic due to a family history of breast cancer and concerns regarding a hereditary predisposition to cancer. Please refer to our prior cancer genetics clinic note for more information regarding our discussion, assessment and recommendations, at the time. Ms. Crain recent genetic test results were disclosed to her, as were recommendations warranted by these results. These results and recommendations are discussed in more detail below.   Ms. Loge is a 49 y.o. female with no personal history of cancer. She had a left breast lumpectomy for ADH in January. Gail score of 4.3% per Dr. Elroy Channel note.   CANCER HISTORY:  Oncology History   No history exists.    FAMILY HISTORY:  We obtained a detailed, 4-generation family history.  Significant diagnoses are listed below: Family History  Problem Relation Age of Onset  . Diabetes Mother   . COPD Father   . Lung cancer Father 7  . Breast cancer Maternal Aunt 74       dx again at 56  . Cancer Paternal Aunt        jaw cancer dx 71s  . Breast cancer Cousin        dx 50s-60s  . Throat cancer Cousin    Ms. Patricelli has a son, Charlotte Crumb, 11 and daughter, Aldona Bar, 66. Patient has a brother, Trilby Drummer, 57 and sister, Tye [place], 52. None have had cancer.   Ms. Dassow mother was diagnosed with cervical cancer in her 17s-40s and had a hysterectomy, she is living at 73. Patient has 2 maternal aunts and 2 maternal uncles. One aunt had breast cancer at 60 and was diagnosed with another breast cancer at 31. This aunt had a daughter who died recently due to metastatic breast cancer diagnosed in her 47s-60s. Maternal grandmother possibly had cancer, passed in her 60s. Grandfather passed in his 28s as well due to a stroke.   Ms. Vanblarcom father died at 63, he had a history of lung cancer and COPD. Patient had 7 maternal aunts/uncles and one aunt had jaw cancer in her 60s. No known cancers in  paternal cousins. Paternal grandparents passed in their 84s, no cancers.  Ms. Morad is unaware of previous family history of genetic testing for hereditary cancer risks. Patient's maternal ancestors are of unknown descent, and paternal ancestors are of unknown descent. There is no reported Ashkenazi Jewish ancestry. There is no known consanguinity.  GENETIC TEST RESULTS: Genetic testing reported out on 04/02/2020 through the [facility] Common Hereditary cancer panel found no pathogenic mutations.   The Common Hereditary Cancers Panel offered by [facility] includes sequencing and/or deletion duplication testing of the following 49 genes: APC, ATM, AXIN2, BARD1, BMPR1A, BRCA1, BRCA2, BRIP1, CDH1, CDKN2A (p14ARF), CDKN2A (p16INK4a), CDC73,  CKD4, CHEK2, CTNNA1, DICER1, EPCAM (Deletion/duplication testing only), GREM1 (promoter region deletion/duplication testing only), KIT, MEN1, MLH1, MSH2, MSH3, MSH6, MUTYH, NBN, NF1, NHTL1, PALB2, PDGFRA, PMS2, POLD1, POLE, PTEN, RAD50, RAD51C, RAD51D, RNF43, SDHB, SDHC, SDHD, SMAD4, SMARCA4. STK11, TP53, TSC1, TSC2, and VHL.  The following genes were evaluated for sequence changes only: SDHA and HOXB13 c.251G>A variant only.  The test report has been scanned into EPIC and is located under the Molecular Pathology section of the Results Review tab.  A portion of the result report is included below for reference.     We discussed with Ms. Mondesir that because current genetic testing is not perfect, it is possible there may be a gene mutation in one of these  genes that current testing cannot detect, but that chance is small.  We also discussed, that there could be another gene that has not yet been discovered, or that we have not yet tested, that is responsible for the cancer diagnoses in the family. It is also possible there is a hereditary cause for the cancer in the family that Ms. Buhman did not inherit and therefore was not identified in her testing.  Therefore, it is  important to remain in touch with cancer genetics in the future so that we can continue to offer Ms. Footman the most up to date genetic testing.   ADDITIONAL GENETIC TESTING: We discussed with Ms. Lehr that her genetic testing was fairly extensive.  If there are genes identified to increase cancer risk that can be analyzed in the future, we would be happy to discuss and coordinate this testing at that time.    CANCER SCREENING RECOMMENDATIONS: Ms. Purifoy test result is considered negative (normal).  This means that we have not identified a hereditary cause for her  family history of cancer at this time.  While reassuring, this does not definitively rule out a hereditary predisposition to cancer. It is still possible that there could be genetic mutations that are undetectable by current technology. There could be genetic mutations in genes that have not been tested or identified to increase cancer risk.  Therefore, it is recommended she continue to follow the cancer management and screening guidelines provided by her oncology and primary healthcare provider.   An individual's cancer risk and medical management are not determined by genetic test results alone. Overall cancer risk assessment incorporates additional factors, including personal medical history, family history, and any available genetic information that may result in a personalized plan for cancer prevention and surveillance.  RECOMMENDATIONS FOR FAMILY MEMBERS:  Relatives in this family might be at some increased risk of developing cancer, over the general population risk, simply due to the family history of cancer.  We recommended female relatives in this family have a yearly mammogram beginning at age 60, or 31 years younger than the earliest onset of cancer, an annual clinical breast exam, and perform monthly breast self-exams. Female relatives in this family should also have a gynecological exam as recommended by their primary  provider.  All family members should be referred for colonoscopy starting at age 26.   It is also possible there is a hereditary cause for the cancer in Ms. Barbee's family that she did not inherit and therefore was not identified in her.  Based on Ms. Poullard's family history, we recommended maternal relatives, especially those with breast cancer, have genetic counseling and testing. Ms. Strain will let us know if we can be of any assistance in coordinating genetic counseling and/or testing for these family members.  FOLLOW-UP: Lastly, we discussed with Ms. Prevette that cancer genetics is a rapidly advancing field and it is possible that new genetic tests will be appropriate for her and/or her family members in the future. We encouraged her to remain in contact with cancer genetics on an annual basis so we can update her personal and family histories and let her know of advances in cancer genetics that may benefit this family.   Our contact number was provided. Ms. Jakel questions were answered to her satisfaction, and she knows she is welcome to call us at anytime with additional questions or concerns.   Faith Rogue, MS, [facility] Genetic Counselor [email or web address] Phone: [phone]

## 2020-04-06 NOTE — Telephone Encounter (Signed)
Revealed negative genetic testing.  This normal result is reassuring.  It is unlikely that there is an increased risk of cancer due to a mutation in one of these genes.  However, genetic testing is not perfect, and cannot definitively rule out a hereditary cause.  It will be important for her to keep in contact with genetics to learn if any additional testing may be needed in the future.      

## 2020-04-07 ENCOUNTER — Ambulatory Visit
Admission: RE | Admit: 2020-04-07 | Discharge: 2020-04-07 | Disposition: A | Discharge status: Home / Self Care | Payer: BC Managed Care – PPO | Source: Ambulatory Visit | Attending: Surgery | Admitting: Surgery

## 2020-04-07 DIAGNOSIS — R922 Inconclusive mammogram: Secondary | ICD-10-CM | POA: Diagnosis not present

## 2020-04-07 DIAGNOSIS — Z803 Family history of malignant neoplasm of breast: Secondary | ICD-10-CM

## 2020-04-07 DIAGNOSIS — N6092 Unspecified benign mammary dysplasia of left breast: Secondary | ICD-10-CM | POA: Diagnosis not present

## 2020-04-14 ENCOUNTER — Other Ambulatory Visit: Payer: Self-pay

## 2020-04-14 ENCOUNTER — Encounter: Payer: Self-pay | Admitting: Surgery

## 2020-04-14 ENCOUNTER — Ambulatory Visit (INDEPENDENT_AMBULATORY_CARE_PROVIDER_SITE_OTHER): Payer: BC Managed Care – PPO | Admitting: Surgery

## 2020-04-14 VITALS — BP 101/67 | HR 71 | Temp 98.1°F | Ht 66.0 in | Wt 163.8 lb

## 2020-04-14 DIAGNOSIS — N6092 Unspecified benign mammary dysplasia of left breast: Secondary | ICD-10-CM

## 2020-04-14 NOTE — Progress Notes (Signed)
Surgical Clinic Progress/Follow-up Note   HPI:  49 y.o. Female presents to clinic for left excisional biopsy for ADH follow-up roughly 6 months follow the last evaluation. Patient reports no breast issues at all, she is content with the lateral scar, denies any news or suspicious nodularity.  She is elected not to utilize hormonal prophylaxis.  She is planning to have her follow-up screening mammography 6 months from now in December.   Review of Systems:  Constitutional: denies fever/chills  ENT: denies sore throat, hearing problems  Respiratory: denies shortness of breath, wheezing  Cardiovascular: denies chest pain, palpitations  Gastrointestinal: denies abdominal pain, N/V, or diarrhea/and bowel function Skin: Denies any other rashes or skin discolorations  Vital Signs:  BP 101/67   Pulse 71   Temp 98.1 F (36.7 C)   Ht 5\' 6"  (1.676 m)   Wt 163 lb 12.8 oz (74.3 kg)   LMP 03/28/2020   SpO2 95%   BMI 26.44 kg/m    Physical Exam:  Constitutional:  -- Normal body habitus  -- Awake, alert, and oriented x3  Pulmonary:  -- No crackles -- Equal breath sounds bilaterally -- Breathing non-labored at rest Cardiovascular:  -- S1, S2 present  -- No pericardial rubs  Left breast, lateral. -- Post-surgical incisions all well-healed.   Musculoskeletal / Integumentary:  -- Wounds or skin discoloration: None appreciated. -- Extremities: B/L UE and LE FROM, hands and feet warm, no edema  --Breast exam, well-healed left lateral scar, without appreciable seroma or mass-effect.  No other suspicious nodularity, densities or masses of either breast.  Laboratory studies: Radiology review: Recent left mammography reviewed.  Imaging: No new pertinent imaging available for review   Assessment:  49 y.o. yo Female with a problem list including...  Patient Active Problem List   Diagnosis Date Noted  . Genetic testing 04/06/2020  . Family history of breast cancer   . Family history of throat  cancer   . Special screening for malignant neoplasms, colon   . Family history of breast cancer in female 10/15/2019  . 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 Transaminitis 12/31/2018  . Gastroesophageal reflux disease without esophagitis 12/17/2017  . Anxiety 12/17/2017  . SVT (supraventricular tachycardia) ([facility]) 04/04/2017  . Bilateral carpal tunnel syndrome 03/20/2012    presents to clinic for follow-up evaluation of left breast, status post excisional biopsy for ADH., progressing well.  Plan:              - return to clinic in 6 months with follow-up screening mammography, or as needed, instructed to call office if any questions or concerns  All of the above recommendations were discussed with the patient and patient's family, and all of patient's and family's questions were answered to his/her/their expressed satisfaction.  03/22/2012, MD, FACS [place]: [facility] - Partnering for exceptional care. Office: [phone]

## 2020-04-14 NOTE — Patient Instructions (Signed)
Bilateral Screening mammogram due in December. You may have this done through your GYN or PCP provider.  Follow-up with our office as needed.  Continue self breast exams. Call office for any new breast issues or concerns.

## 2020-04-17 ENCOUNTER — Other Ambulatory Visit: Payer: Self-pay | Admitting: Cardiovascular Disease

## 2020-04-22 ENCOUNTER — Other Ambulatory Visit: Payer: Self-pay | Admitting: *Deleted

## 2020-04-22 ENCOUNTER — Inpatient Hospital Stay: Payer: BC Managed Care – PPO | Attending: Oncology | Admitting: Oncology

## 2020-04-22 ENCOUNTER — Other Ambulatory Visit: Payer: Self-pay

## 2020-04-22 VITALS — BP 111/71 | HR 58 | Temp 97.8°F | Resp 16 | Ht 66.0 in | Wt 163.7 lb

## 2020-04-22 DIAGNOSIS — Z801 Family history of malignant neoplasm of trachea, bronchus and lung: Secondary | ICD-10-CM | POA: Insufficient documentation

## 2020-04-22 DIAGNOSIS — Z79899 Other long term (current) drug therapy: Secondary | ICD-10-CM | POA: Diagnosis not present

## 2020-04-22 DIAGNOSIS — Z803 Family history of malignant neoplasm of breast: Secondary | ICD-10-CM | POA: Diagnosis not present

## 2020-04-22 DIAGNOSIS — I2510 Atherosclerotic heart disease of native coronary artery without angina pectoris: Secondary | ICD-10-CM | POA: Insufficient documentation

## 2020-04-22 DIAGNOSIS — N6092 Unspecified benign mammary dysplasia of left breast: Secondary | ICD-10-CM | POA: Diagnosis not present

## 2020-04-22 DIAGNOSIS — Z8249 Family history of ischemic heart disease and other diseases of the circulatory system: Secondary | ICD-10-CM | POA: Insufficient documentation

## 2020-04-22 MED ORDER — TAMOXIFEN CITRATE 20 MG PO TABS: 20.0000 mg | ORAL_TABLET | Freq: Every day | ORAL | 3 refills | Status: DC

## 2020-04-22 NOTE — Progress Notes (Signed)
Pt says she got sore starting tues with md feling breasts- it is bilateral and she feels like she is going through menopause and she had spotting and she feels like the breast soreness goes with menstrual cycle side effects

## 2020-04-22 NOTE — Progress Notes (Signed)
Hematology/Oncology Consult note [facility]  [phone] [phone]  Patient Care Team: Reubin Milan, MD as PCP - General (Internal Medicine) Antonieta Iba, MD as PCP - Cardiology (Cardiology) Almond Lint, MD as Referring Physician (Cardiology)   Name of the patient: Tammy Acevedo  [id number]  06-21-1971   Date of visit: 04/22/20  Diagnosis-atypical ductal hyperplasia  Chief complaint/ Reason for visit-routine follow-up of atypical ductal hyperplasia  Heme/Onc history: Patient is a 49 year old premenopausal female with no prior significant medical history for breast cancer or breast biopsies.  She recently underwent a screening mammogram in October 2020 with which showed possible architectural distortion in the left breast as well as a possible mass in the right breast.  Ultrasound of the right breast showed a benign cyst in the right breast.  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  Patient was seen by Dr. Claudine Mouton and she underwent a lumpectomy which showed a radial scar with florid ductal hyperplasia and sclerosing adenosis.  Normal residual atypical proliferative breast disease or evidence of DCIS or invasive malignancy.  Margins were negative  Risks and benefits of chemoprevention for atypical ductal hyperplasia but tamoxifen was discussed.  Patient has not started taking tamoxifen yet.  Interval history-patient reports feeling well and denies any complaints at this time.  She has not started taking tamoxifen yet.  She was also seen by Dr. Claudine Mouton A week ago and underwent a unilateral left breast mammogram which was unremarkable.  ECOG PS- 0 Pain scale- 0   Review of systems- Review of Systems  Constitutional: Negative for chills, fever, malaise/fatigue and weight loss.  HENT: Negative  for congestion, ear discharge and nosebleeds.   Eyes: Negative for blurred vision.  Respiratory: Negative for cough, hemoptysis, sputum production, shortness of breath and wheezing.   Cardiovascular: Negative for chest pain, palpitations, orthopnea and claudication.  Gastrointestinal: Negative for abdominal pain, blood in stool, constipation, diarrhea, heartburn, melena, nausea and vomiting.  Genitourinary: Negative for dysuria, flank pain, frequency, hematuria and urgency.  Musculoskeletal: Negative for back pain, joint pain and myalgias.  Skin: Negative for rash.  Neurological: Negative for dizziness, tingling, focal weakness, seizures, weakness and headaches.  Endo/Heme/Allergies: Does not bruise/bleed easily.  Psychiatric/Behavioral: Negative for depression and suicidal ideas. The patient does not have insomnia.       No Known Allergies   Past Medical History:  Diagnosis Date   Chest pain    a. 08/2015 ETT: Ex time 9:01, Max HR 166, No ECG changes->Nl study.   Family history of breast cancer    Family history of lung cancer    Family history of throat cancer    Hypertension    Mild Mitral regurgitation    a. 08/2015 Echo: EF 50%, no rwma, Triv TR/PR, mild MR.   SVT (supraventricular tachycardia) (HCC)      Past Surgical History:  Procedure Laterality Date   BREAST BIOPSY Left 08/11/2019   distortion, coil marker, radial scar and ADH   BREAST BIOPSY Left 10/07/2019   Procedure: BREAST BIOPSY WITH NEEDLE LOCALIZATION;  Surgeon: Campbell Lerner, MD;  Location: [facility] ORS;  Service: General;  Laterality: Left;   BREAST LUMPECTOMY Left 10/07/2019   radial scar, ADH, dense stromal fibrosis excisied, clear surgical margins   CARPAL TUNNEL RELEASE     CHOLECYSTECTOMY     COLONOSCOPY WITH PROPOFOL N/A  12/07/2019   Procedure: COLONOSCOPY WITH PROPOFOL;  Surgeon: Midge Minium, MD;  Location: [facility];  Service: Endoscopy;  Laterality: N/A;   TUBAL LIGATION       Social History   Socioeconomic History   Marital status: Married    Spouse name: Not on file   Number of children: 2   Years of education: Not on file   Highest education level: Not on file  Occupational History   Not on file  Tobacco Use   Smoking status: Never Smoker   Smokeless tobacco: Never Used  Vaping Use   Vaping Use: Never used  Substance and Sexual Activity   Alcohol use: No    Alcohol/week: 0.0 standard drinks   Drug use: No   Sexual activity: Not Currently  Other Topics Concern   Not on file  Social History Narrative   Not on file   Social Determinants of Health   Financial Resource Strain:    Difficulty of Paying Living Expenses:   Food Insecurity:    Worried About Programme researcher, broadcasting/film/video in the Last Year:    Barista in the Last Year:   Transportation Needs:    Freight forwarder (Medical):    Lack of Transportation (Non-Medical):   Physical Activity:    Days of Exercise per Week:    Minutes of Exercise per Session:   Stress:    Feeling of Stress :   Social Connections:    Frequency of Communication with Friends and Family:    Frequency of Social Gatherings with Friends and Family:    Attends Religious Services:    Active Member of Clubs or Organizations:    Attends Engineer, structural:    Marital Status:   Intimate Partner Violence:    Fear of Current or Ex-Partner:    Emotionally Abused:    Physically Abused:    Sexually Abused:     Family History  Problem Relation Age of Onset   Diabetes Mother    COPD Father    Lung cancer Father 96   Breast cancer Maternal Aunt 24       dx again at 79~metastatic   Cancer Paternal Aunt        jaw cancer dx 77s   Breast cancer Cousin        dx 50s-60s. MAT that had breast cancer's daughter   Throat cancer Cousin      Current Outpatient Medications:    acetaminophen (TYLENOL) 500 MG tablet, Take 1,000 mg by mouth every 6 (six) hours as  needed for moderate pain. , Disp: , Rfl:    ELDERBERRY PO, Take by mouth daily., Disp: , Rfl:    metoprolol tartrate (LOPRESSOR) 25 MG tablet, Take 1 tablet by mouth twice daily, Disp: 180 tablet, Rfl: 1   tamoxifen (NOLVADEX) 20 MG tablet, Take 1 tablet (20 mg total) by mouth daily., Disp: 30 tablet, Rfl: 3  Physical exam:  Vitals:   04/22/20 1122  BP: 111/71  Pulse: 58  Resp: 16  Temp: 97.8 F (36.6 C)  TempSrc: Oral  Weight: 163 lb 11.2 oz (74.3 kg)  Height: 5\' 6"  (1.676 m)   Physical Exam Constitutional:      General: She is not in acute distress. Pulmonary:     Effort: Pulmonary effort is normal.  Musculoskeletal:     Right lower leg: No edema.     Left lower leg: No edema.  Skin:    General: Skin is warm  and dry.  Neurological:     Mental Status: She is alert and oriented to person, place, and time.      CMP Latest Ref Rng & Units 09/29/2019  Glucose 65 - 99 mg/dL 88  BUN 6 - 24 mg/dL 9  Creatinine 6.64 - 4.03 mg/dL 4.74  Sodium 259 - 563 mmol/L 139  Potassium 3.5 - 5.2 mmol/L 4.3  Chloride 96 - 106 mmol/L 104  CO2 20 - 29 mmol/L 23  Calcium 8.7 - 10.2 mg/dL 8.9  Total Protein 6.0 - 8.5 g/dL 6.7  Total Bilirubin 0.0 - 1.2 mg/dL 0.5  Alkaline Phos 39 - 117 IU/L 78  AST 0 - 40 IU/L 15  ALT 0 - 32 IU/L 11   CBC Latest Ref Rng & Units 09/29/2019  WBC 3.4 - 10.8 x10E3/uL 6.3  Hemoglobin 11.1 - 15.9 g/dL 87.5  Hematocrit 64.3 - 46.6 % 38.0  Platelets 150 - 450 x10E3/uL 230    No images are attached to the encounter.  MM DIAG BREAST TOMO UNI LEFT  Result Date: 04/07/2020 CLINICAL DATA:  Patient underwent stereotactic core needle biopsy of a left breast architectural distortion with pathology revealing a radial scar and atypical ductal hyperplasia. This was subsequently localized and excised in January 2021. Patient presents for diagnostic follow-up. EXAM: DIGITAL DIAGNOSTIC UNILATERAL LEFT MAMMOGRAM WITH TOMO AND CAD COMPARISON:  Previous exam(s). ACR Breast  Density Category c: The breast tissue is heterogeneously dense, which may obscure small masses. FINDINGS: Postsurgical architectural distortion is now noted in the left breast posteriorly. There are no suspicious masses or areas of nonsurgical architectural distortion. There are no suspicious calcifications. Mammographic images were processed with CAD. IMPRESSION: 1. No evidence of breast carcinoma. 2. Benign postsurgical changes on the left. RECOMMENDATION: Screening mammogram in October 2021, last screening study dated 07/21/2019.(Code:[id number]) I have discussed the findings and recommendations with the patient. If applicable, a reminder letter will be sent to the patient regarding the next appointment. BI-RADS CATEGORY  2: Benign. Electronically Signed   By: Amie Portland M.D.   On: 04/07/2020 11:46     Assessment and plan- Patient is a 49 y.o. female with h/o diagnosed radial scar of the left breast with focus of atypical ductal hyperplasia s/p lumpectomy  Patient has not started taking tamoxifen yet.  I had discussed pros and cons of tamoxifen back in January 2021 for atypical ductal hyperplasia.  At this time patient is willing to consider tamoxifen as chemoprevention for the next 5 years.  Patient understands the risks and benefits of tamoxifen including all but not limited to fatigue, hot flashes, mood swings and possible risks of DVT and uterine cancer.  I will go ahead and send her a prescription for the same.  She will call us and let us know if she has any untoward side effects.  I will see her back in 6 months with CMP    Visit Diagnosis 1. Atypical ductal hyperplasia of left breast      Dr. Owens Shark, MD, MPH [facility] at [facility] [phone] 04/22/2020 3:42 PM

## 2020-06-08 DIAGNOSIS — Z1231 Encounter for screening mammogram for malignant neoplasm of breast: Secondary | ICD-10-CM | POA: Diagnosis not present

## 2020-06-08 DIAGNOSIS — Z01419 Encounter for gynecological examination (general) (routine) without abnormal findings: Secondary | ICD-10-CM | POA: Diagnosis not present

## 2020-06-08 LAB — RESULTS CONSOLE HPV: CHL HPV: NEGATIVE

## 2020-06-08 LAB — HM PAP SMEAR: HM Pap smear: NORMAL

## 2020-06-28 DIAGNOSIS — H25041 Posterior subcapsular polar age-related cataract, right eye: Secondary | ICD-10-CM | POA: Diagnosis not present

## 2020-07-07 ENCOUNTER — Encounter: Payer: Self-pay | Admitting: Ophthalmology

## 2020-07-07 ENCOUNTER — Other Ambulatory Visit: Payer: Self-pay

## 2020-07-11 ENCOUNTER — Other Ambulatory Visit: Payer: Self-pay

## 2020-07-11 ENCOUNTER — Other Ambulatory Visit
Admission: RE | Admit: 2020-07-11 | Discharge: 2020-07-11 | Disposition: A | Discharge status: Home / Self Care | Payer: BC Managed Care – PPO | Source: Ambulatory Visit | Attending: Ophthalmology | Admitting: Ophthalmology

## 2020-07-11 DIAGNOSIS — Z01812 Encounter for preprocedural laboratory examination: Secondary | ICD-10-CM | POA: Diagnosis not present

## 2020-07-11 DIAGNOSIS — Z20822 Contact with and (suspected) exposure to covid-19: Secondary | ICD-10-CM | POA: Diagnosis not present

## 2020-07-11 LAB — SARS CORONAVIRUS 2 (TAT 6-24 HRS): SARS Coronavirus 2: NEGATIVE

## 2020-07-11 NOTE — Discharge Instructions (Signed)

## 2020-07-12 NOTE — Anesthesia Preprocedure Evaluation (Addendum)
Anesthesia Evaluation  Patient identified by MRN, date of birth, ID band Patient awake    Reviewed: Allergy & Precautions, NPO status , Patient's Chart, lab work & pertinent test results, reviewed documented beta blocker date and time   History of Anesthesia Complications Negative for: history of anesthetic complications  Airway Mallampati: II  TM Distance: >3 FB Neck ROM: Full    Dental  (+) Partial Upper,    Pulmonary    breath sounds clear to auscultation       Cardiovascular hypertension, (-) angina(-) DOE + dysrhythmias Supra Ventricular Tachycardia  Rhythm:Regular Rate:Normal   TTE (2016): LVEF 50 %  MILD MR   Neuro/Psych Anxiety  Neuromuscular disease (carpal tunnel)    GI/Hepatic GERD  Controlled,  Endo/Other    Renal/GU      Musculoskeletal   Abdominal   Peds  Hematology   Anesthesia Other Findings   Reproductive/Obstetrics                            Anesthesia Physical Anesthesia Plan  ASA: II  Anesthesia Plan: MAC   Post-op Pain Management:    Induction: Intravenous  PONV Risk Score and Plan: 2 and TIVA, Midazolam and Treatment may vary due to age or medical condition  Airway Management Planned: Nasal Cannula  Additional Equipment:   Intra-op Plan:   Post-operative Plan:   Informed Consent: I have reviewed the patients History and Physical, chart, labs and discussed the procedure including the risks, benefits and alternatives for the proposed anesthesia with the patient or authorized representative who has indicated his/her understanding and acceptance.       Plan Discussed with: CRNA and Anesthesiologist  Anesthesia Plan Comments:         Anesthesia Quick Evaluation

## 2020-07-13 ENCOUNTER — Ambulatory Visit: Payer: BC Managed Care – PPO | Admitting: Anesthesiology

## 2020-07-13 ENCOUNTER — Other Ambulatory Visit: Payer: Self-pay

## 2020-07-13 ENCOUNTER — Ambulatory Visit
Admission: RE | Admit: 2020-07-13 | Discharge: 2020-07-13 | Disposition: A | Discharge status: Home / Self Care | Payer: BC Managed Care – PPO | Attending: Ophthalmology | Admitting: Ophthalmology

## 2020-07-13 ENCOUNTER — Encounter: Payer: Self-pay | Admitting: Ophthalmology

## 2020-07-13 ENCOUNTER — Encounter
Admission: RE | Disposition: A | Discharge status: Home / Self Care | Payer: Self-pay | Source: Home / Self Care | Attending: Ophthalmology

## 2020-07-13 DIAGNOSIS — Z79899 Other long term (current) drug therapy: Secondary | ICD-10-CM | POA: Insufficient documentation

## 2020-07-13 DIAGNOSIS — F419 Anxiety disorder, unspecified: Secondary | ICD-10-CM | POA: Insufficient documentation

## 2020-07-13 DIAGNOSIS — I10 Essential (primary) hypertension: Secondary | ICD-10-CM | POA: Diagnosis not present

## 2020-07-13 DIAGNOSIS — K219 Gastro-esophageal reflux disease without esophagitis: Secondary | ICD-10-CM | POA: Insufficient documentation

## 2020-07-13 DIAGNOSIS — H25041 Posterior subcapsular polar age-related cataract, right eye: Secondary | ICD-10-CM | POA: Diagnosis not present

## 2020-07-13 DIAGNOSIS — I472 Ventricular tachycardia: Secondary | ICD-10-CM | POA: Diagnosis not present

## 2020-07-13 DIAGNOSIS — H25811 Combined forms of age-related cataract, right eye: Secondary | ICD-10-CM | POA: Diagnosis not present

## 2020-07-13 DIAGNOSIS — Z9049 Acquired absence of other specified parts of digestive tract: Secondary | ICD-10-CM | POA: Insufficient documentation

## 2020-07-13 DIAGNOSIS — H2511 Age-related nuclear cataract, right eye: Secondary | ICD-10-CM | POA: Diagnosis not present

## 2020-07-13 HISTORY — DX: Presence of dental prosthetic device (complete) (partial): Z97.2

## 2020-07-13 HISTORY — PX: CATARACT EXTRACTION W/PHACO: SHX586

## 2020-07-13 LAB — POCT PREGNANCY, URINE: Preg Test, Ur: NEGATIVE

## 2020-07-13 SURGERY — PHACOEMULSIFICATION, CATARACT, WITH IOL INSERTION
Anesthesia: Monitor Anesthesia Care | Site: Eye | Laterality: Right

## 2020-07-13 MED ORDER — MOXIFLOXACIN HCL 0.5 % OP SOLN
1.0000 [drp] | OPHTHALMIC | Status: DC | PRN
  Administered 2020-07-13 (×3): 1 [drp] via OPHTHALMIC

## 2020-07-13 MED ORDER — LACTATED RINGERS IV SOLN: INTRAVENOUS | Status: DC

## 2020-07-13 MED ORDER — MIDAZOLAM HCL 2 MG/2ML IJ SOLN
INTRAMUSCULAR | Status: DC | PRN
  Administered 2020-07-13: 1 mg via INTRAVENOUS

## 2020-07-13 MED ORDER — BRIMONIDINE TARTRATE-TIMOLOL 0.2-0.5 % OP SOLN
OPHTHALMIC | Status: DC | PRN
  Administered 2020-07-13: 1 [drp] via OPHTHALMIC

## 2020-07-13 MED ORDER — ARMC OPHTHALMIC DILATING DROPS
1.0000 "application " | OPHTHALMIC | Status: DC | PRN
  Administered 2020-07-13 (×3): 1 via OPHTHALMIC

## 2020-07-13 MED ORDER — NA HYALUR & NA CHOND-NA HYALUR 0.4-0.35 ML IO KIT
PACK | INTRAOCULAR | Status: DC | PRN
  Administered 2020-07-13: 1 mL via INTRAOCULAR

## 2020-07-13 MED ORDER — FENTANYL CITRATE (PF) 100 MCG/2ML IJ SOLN
INTRAMUSCULAR | Status: DC | PRN
  Administered 2020-07-13: 50 ug via INTRAVENOUS

## 2020-07-13 MED ORDER — CEFUROXIME OPHTHALMIC INJECTION 1 MG/0.1 ML
INJECTION | OPHTHALMIC | Status: DC | PRN
  Administered 2020-07-13: 0.1 mL via INTRACAMERAL

## 2020-07-13 MED ORDER — EPINEPHRINE PF 1 MG/ML IJ SOLN
INTRAOCULAR | Status: DC | PRN
  Administered 2020-07-13: 84 mL via OPHTHALMIC

## 2020-07-13 MED ORDER — LIDOCAINE HCL (PF) 2 % IJ SOLN
INTRAOCULAR | Status: DC | PRN
  Administered 2020-07-13: 1 mL

## 2020-07-13 MED ORDER — TETRACAINE HCL 0.5 % OP SOLN
1.0000 [drp] | OPHTHALMIC | Status: DC | PRN
  Administered 2020-07-13 (×3): 1 [drp] via OPHTHALMIC

## 2020-07-13 SURGICAL SUPPLY — 29 items
CANNULA ANT/CHMB 27G (MISCELLANEOUS) ×1 IMPLANT
CANNULA ANT/CHMB 27GA (MISCELLANEOUS) ×2 IMPLANT
GLOVE SURG LX 7.5 STRW (GLOVE) ×2
GLOVE SURG LX STRL 7.5 STRW (GLOVE) ×1 IMPLANT
GLOVE SURG TRIUMPH 8.0 PF LTX (GLOVE) ×2 IMPLANT
GOWN STRL REUS W/ TWL LRG LVL3 (GOWN DISPOSABLE) ×2 IMPLANT
GOWN STRL REUS W/TWL LRG LVL3 (GOWN DISPOSABLE) ×4
LENS IOL DIOP 26.0 (Intraocular Lens) ×2 IMPLANT
LENS IOL TECNIS MONO 26.0 (Intraocular Lens) IMPLANT
MARKER SKIN DUAL TIP RULER LAB (MISCELLANEOUS) ×2 IMPLANT
NDL CAPSULORHEX 25GA (NEEDLE) ×1 IMPLANT
NDL FILTER BLUNT 18X1 1/2 (NEEDLE) ×2 IMPLANT
NDL RETROBULBAR .5 NSTRL (NEEDLE) IMPLANT
NEEDLE CAPSULORHEX 25GA (NEEDLE) ×2 IMPLANT
NEEDLE FILTER BLUNT 18X 1/2SAF (NEEDLE) ×2
NEEDLE FILTER BLUNT 18X1 1/2 (NEEDLE) ×2 IMPLANT
PACK CATARACT BRASINGTON (MISCELLANEOUS) ×2 IMPLANT
PACK EYE AFTER SURG (MISCELLANEOUS) ×2 IMPLANT
PACK OPTHALMIC (MISCELLANEOUS) ×2 IMPLANT
RING MALYGIN 7.0 (MISCELLANEOUS) IMPLANT
SOLUTION OPHTHALMIC SALT (MISCELLANEOUS) ×2 IMPLANT
SUT ETHILON 10-0 CS-B-6CS-B-6 (SUTURE)
SUT VICRYL  9 0 (SUTURE)
SUT VICRYL 9 0 (SUTURE) IMPLANT
SUTURE EHLN 10-0 CS-B-6CS-B-6 (SUTURE) IMPLANT
SYR 3ML LL SCALE MARK (SYRINGE) ×4 IMPLANT
SYR TB 1ML LUER SLIP (SYRINGE) ×2 IMPLANT
WATER STERILE IRR 250ML POUR (IV SOLUTION) ×2 IMPLANT
WIPE NON LINTING 3.25X3.25 (MISCELLANEOUS) ×2 IMPLANT

## 2020-07-13 NOTE — Transfer of Care (Signed)
Immediate Anesthesia Transfer of Care Note  Patient: Tammy Acevedo  Procedure(s) Performed: CATARACT EXTRACTION PHACO AND INTRAOCULAR LENS PLACEMENT (IOC) RIGHT (Right Eye)  Patient Location: PACU  Anesthesia Type: MAC  Level of Consciousness: awake, alert  and patient cooperative  Airway and Oxygen Therapy: Patient Spontanous Breathing and Patient connected to supplemental oxygen  Post-op Assessment: Post-op Vital signs reviewed, Patient's Cardiovascular Status Stable, Respiratory Function Stable, Patent Airway and No signs of Nausea or vomiting  Post-op Vital Signs: Reviewed and stable  Complications: No complications documented.

## 2020-07-13 NOTE — H&P (Signed)

## 2020-07-13 NOTE — Anesthesia Procedure Notes (Signed)
Procedure Name: MAC Date/Time: 07/13/2020 12:45 PM Performed by: Cameron Ali, CRNA Pre-anesthesia Checklist: Patient identified, Emergency Drugs available, Suction available, Timeout performed and Patient being monitored Patient Re-evaluated:Patient Re-evaluated prior to induction Oxygen Delivery Method: Nasal cannula Placement Confirmation: positive ETCO2

## 2020-07-13 NOTE — Op Note (Signed)
LOCATION:  [facility]   PREOPERATIVE DIAGNOSIS:    Nuclear sclerotic cataract right eye. H25.11   POSTOPERATIVE DIAGNOSIS:  Nuclear sclerotic cataract right eye.     PROCEDURE:  Phacoemusification with posterior chamber intraocular lens placement of the right eye   ULTRASOUND TIME: Procedure(s) with comments: CATARACT EXTRACTION PHACO AND INTRAOCULAR LENS PLACEMENT (IOC) RIGHT (Right) - 7.53 1:21.3 9.3%  LENS:   Implant Name Type Inv. Item Serial No. Manufacturer Lot No. LRB No. Used Action  LENS IOL DIOP 26.0 - [id number] Intraocular Lens LENS IOL DIOP 26.0 [id number]   Right 1 Implanted         SURGEON:  Deirdre Evener, MD   ANESTHESIA:  Topical with tetracaine drops and 2% Xylocaine jelly, augmented with 1% preservative-free intracameral lidocaine.    COMPLICATIONS:  None.   DESCRIPTION OF PROCEDURE:  The patient was identified in the holding room and transported to the operating room and placed in the supine position under the operating microscope.  The right eye was identified as the operative eye and it was prepped and draped in the usual sterile ophthalmic fashion.   A 1 millimeter clear-corneal paracentesis was made at the 12:00 position.  0.5 ml of preservative-free 1% lidocaine was injected into the anterior chamber. The anterior chamber was filled with Viscoat viscoelastic.  A 2.4 millimeter keratome was used to make a near-clear corneal incision at the 9:00 position.  A curvilinear capsulorrhexis was made with a cystotome and capsulorrhexis forceps.  Balanced salt solution was used to hydrodissect and hydrodelineate the nucleus.   Phacoemulsification was then used in stop and chop fashion to remove the lens nucleus and epinucleus.  The remaining cortex was then removed using the irrigation and aspiration handpiece. Provisc was then placed into the capsular bag to distend it for lens placement.  A lens was then injected into the capsular bag.  The  remaining viscoelastic was aspirated.   Wounds were hydrated with balanced salt solution.  The anterior chamber was inflated to a physiologic pressure with balanced salt solution.  No wound leaks were noted. Cefuroxime 0.1 ml of a 10mg /ml solution was injected into the anterior chamber for a dose of 1 mg of intracameral antibiotic at the completion of the case.   Timolol and Brimonidine drops were applied to the eye.  The patient was taken to the recovery room in stable condition without complications of anesthesia or surgery.   Russie Gulledge 07/13/2020, 1:06 PM

## 2020-07-13 NOTE — Anesthesia Postprocedure Evaluation (Signed)
Anesthesia Post Note  Patient: Janesia Joswick  Procedure(s) Performed: CATARACT EXTRACTION PHACO AND INTRAOCULAR LENS PLACEMENT (IOC) RIGHT (Right Eye)     Patient location during evaluation: PACU Anesthesia Type: MAC Level of consciousness: awake and alert Pain management: pain level controlled Vital Signs Assessment: post-procedure vital signs reviewed and stable Respiratory status: spontaneous breathing, nonlabored ventilation, respiratory function stable and patient connected to nasal cannula oxygen Cardiovascular status: stable and blood pressure returned to baseline Postop Assessment: no apparent nausea or vomiting Anesthetic complications: no   No complications documented.  Mayes Sangiovanni A  Galina Haddox

## 2020-07-14 ENCOUNTER — Encounter: Payer: Self-pay | Admitting: Ophthalmology

## 2020-07-15 ENCOUNTER — Other Ambulatory Visit: Payer: Self-pay

## 2020-07-15 ENCOUNTER — Emergency Department
Admission: EM | Admit: 2020-07-15 | Discharge: 2020-07-15 | Disposition: A | Discharge status: Home / Self Care | Payer: BC Managed Care – PPO | Attending: Emergency Medicine | Admitting: Emergency Medicine

## 2020-07-15 ENCOUNTER — Encounter: Payer: Self-pay | Admitting: Emergency Medicine

## 2020-07-15 DIAGNOSIS — Z79899 Other long term (current) drug therapy: Secondary | ICD-10-CM | POA: Diagnosis not present

## 2020-07-15 DIAGNOSIS — Z8616 Personal history of COVID-19: Secondary | ICD-10-CM | POA: Diagnosis not present

## 2020-07-15 DIAGNOSIS — I10 Essential (primary) hypertension: Secondary | ICD-10-CM | POA: Diagnosis not present

## 2020-07-15 DIAGNOSIS — I471 Supraventricular tachycardia: Secondary | ICD-10-CM | POA: Diagnosis not present

## 2020-07-15 DIAGNOSIS — R000 Tachycardia, unspecified: Secondary | ICD-10-CM | POA: Diagnosis not present

## 2020-07-15 LAB — CBC WITH DIFFERENTIAL/PLATELET
Abs Immature Granulocytes: 0.02 10*3/uL (ref 0.00–0.07)
Basophils Absolute: 0 10*3/uL (ref 0.0–0.1)
Basophils Relative: 0 %
Eosinophils Absolute: 0.1 10*3/uL (ref 0.0–0.5)
Eosinophils Relative: 1 %
HCT: 39.1 % (ref 36.0–46.0)
Hemoglobin: 13.3 g/dL (ref 12.0–15.0)
Immature Granulocytes: 0 %
Lymphocytes Relative: 30 %
Lymphs Abs: 4.4 10*3/uL — ABNORMAL HIGH (ref 0.7–4.0)
MCH: 29 pg (ref 26.0–34.0)
MCHC: 34 g/dL (ref 30.0–36.0)
MCV: 85.4 fL (ref 80.0–100.0)
Monocytes Absolute: 0.6 10*3/uL (ref 0.1–1.0)
Monocytes Relative: 4 %
Neutro Abs: 9.5 10*3/uL — ABNORMAL HIGH (ref 1.7–7.7)
Neutrophils Relative %: 65 %
Platelets: 293 10*3/uL (ref 150–400)
RBC: 4.58 MIL/uL (ref 3.87–5.11)
RDW: 13 % (ref 11.5–15.5)
WBC: 14.7 10*3/uL — ABNORMAL HIGH (ref 4.0–10.5)
nRBC: 0 % (ref 0.0–0.2)

## 2020-07-15 LAB — BASIC METABOLIC PANEL
Anion gap: 12 (ref 5–15)
BUN: 13 mg/dL (ref 6–20)
CO2: 24 mmol/L (ref 22–32)
Calcium: 9 mg/dL (ref 8.9–10.3)
Chloride: 104 mmol/L (ref 98–111)
Creatinine, Ser: 1.29 mg/dL — ABNORMAL HIGH (ref 0.44–1.00)
GFR, Estimated: 48 mL/min — ABNORMAL LOW (ref >60)
Glucose, Bld: 155 mg/dL — ABNORMAL HIGH (ref 70–99)
Potassium: 3.8 mmol/L (ref 3.5–5.1)
Sodium: 140 mmol/L (ref 135–145)

## 2020-07-15 NOTE — Discharge Instructions (Addendum)
Please seek medical attention for any high fevers, chest pain, shortness of breath, change in behavior, persistent vomiting, bloody stool or any other new or concerning symptoms.  

## 2020-07-15 NOTE — ED Provider Notes (Signed)
[facility] Emergency Department Provider Note  ____________________________________________   I have reviewed the triage vital signs and the nursing notes.   HISTORY  Chief Complaint Chest pain  History limited by: Not Limited   HPI Tammy Acevedo is a 49 y.o. female who presents to the emergency department today because of concern for chest pain and tachycardia. The patient says she was cleaning her house when she started feeling chest pressure. It was accompanied by shortness of breath. She has a history of SVT and this felt similar to her previous episodes. She felt in her normal state of health earlier in the day. Did have sweet tea today. The patient's rhythm was broken with vagal maneuver her in the ED prior to my assessment and the patient says that she feels significant relief of her symptoms.    Records reviewed. Per medical record review patient has a history of SVT  Past Medical History:  Diagnosis Date  . Chest pain    a. 08/2015 ETT: Ex time 9:01, Max HR 166, No ECG changes->Nl study.  Marland Kitchen COVID-19 08/31/2019  . Family history of breast cancer   . Family history of lung cancer   . Family history of throat cancer   . Hypertension   . Mild Mitral regurgitation    a. 08/2015 Echo: EF 50%, no rwma, Triv TR/PR, mild MR.  . SVT (supraventricular tachycardia) (HCC)   . Wears dentures    partial upper    Patient Active Problem List   Diagnosis Date Noted  . Genetic testing 04/06/2020  . Family history of breast cancer   . Family history of throat cancer   . Special screening for malignant neoplasms, colon   . Family history of breast cancer in female 10/15/2019  . 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 Transaminitis 12/31/2018  . Gastroesophageal reflux disease without esophagitis 12/17/2017  . Anxiety 12/17/2017  . SVT (supraventricular tachycardia) (HCC) 04/04/2017  . Bilateral carpal tunnel syndrome 03/20/2012     Past Surgical History:  Procedure Laterality Date  . BREAST BIOPSY Left 08/11/2019   distortion, coil marker, radial scar and ADH  . BREAST BIOPSY Left 10/07/2019   Procedure: BREAST BIOPSY WITH NEEDLE LOCALIZATION;  Surgeon: Campbell Lerner, MD;  Location: [facility];  Service: General;  Laterality: Left;  . BREAST LUMPECTOMY Left 10/07/2019   radial scar, ADH, dense stromal fibrosis excisied, clear surgical margins  . CARPAL TUNNEL RELEASE    . CATARACT EXTRACTION W/PHACO Right 07/13/2020   Procedure: CATARACT EXTRACTION PHACO AND INTRAOCULAR LENS PLACEMENT (IOC) RIGHT;  Surgeon: Lockie Mola, MD;  Location: [facility];  Service: Ophthalmology;  Laterality: Right;  7.53 1:21.3 9.3%  . CHOLECYSTECTOMY    . COLONOSCOPY WITH PROPOFOL N/A 12/07/2019   Procedure: COLONOSCOPY WITH PROPOFOL;  Surgeon: Midge Minium, MD;  Location: [facility];  Service: Endoscopy;  Laterality: N/A;  . TUBAL LIGATION      Prior to Admission medications   Medication Sig Start Date End Date Taking? Authorizing Provider  acetaminophen (TYLENOL) 500 MG tablet Take 1,000 mg by mouth every 6 (six) hours as needed for moderate pain.     [provider]  ELDERBERRY PO Take by mouth daily.    [provider]  Loratadine 10 MG CAPS Take by mouth daily.    [provider]  metoprolol tartrate (LOPRESSOR) 25 MG tablet Take 1 tablet by mouth twice daily 04/18/20   Antonieta Iba, MD  tamoxifen (NOLVADEX) 20  MG tablet Take 1 tablet (20 mg total) by mouth daily. 04/22/20   Creig Hines, MD    Allergies Patient has no known allergies.  Family History  Problem Relation Age of Onset  . Diabetes Mother   . COPD Father   . Lung cancer Father 97  . Breast cancer Maternal Aunt 51       dx again at 79~metastatic  . Cancer Paternal Aunt        jaw cancer dx 35s  . Breast cancer Cousin        dx 50s-60s. MAT that had breast cancer's daughter  . Throat cancer Cousin      Social History Social History   Tobacco Use  . Smoking status: Never Smoker  . Smokeless tobacco: Never Used  Vaping Use  . Vaping Use: Never used  Substance Use Topics  . Alcohol use: No    Alcohol/week: 0.0 standard drinks  . Drug use: No    Review of Systems Constitutional: No fever/chills Eyes: No visual changes. ENT: No sore throat. Cardiovascular: Positive for chest pain. Respiratory: Denies shortness of breath. Gastrointestinal: No abdominal pain.  No nausea, no vomiting.  No diarrhea.   Genitourinary: Negative for dysuria. Musculoskeletal: Negative for back pain. Skin: Negative for rash. Neurological: Negative for headaches, focal weakness or numbness.  ____________________________________________   PHYSICAL EXAM:  VITAL SIGNS: ED Triage Vitals  Enc Vitals Group     BP 111/81     Pulse 71     Resp 26     Temp 97.7     Temp src      SpO2 96%   Constitutional: Alert and oriented.  Eyes: Conjunctivae are normal.  ENT      Head: Normocephalic and atraumatic.      Nose: No congestion/rhinnorhea.      Mouth/Throat: Mucous membranes are moist.      Neck: No stridor. Hematological/Lymphatic/Immunilogical: No cervical lymphadenopathy. Cardiovascular: Normal rate, regular rhythm.  No murmurs, rubs, or gallops.  Respiratory: Normal respiratory effort without tachypnea nor retractions. Breath sounds are clear and equal bilaterally. No wheezes/rales/rhonchi. Gastrointestinal: Soft and non tender. No rebound. No guarding.  Genitourinary: Deferred Musculoskeletal: Normal range of motion in all extremities. No lower extremity edema. Neurologic:  Normal speech and language. No gross focal neurologic deficits are appreciated.  Skin:  Skin is warm, dry and intact. No rash noted. Psychiatric: Mood and affect are normal. Speech and behavior are normal. Patient exhibits appropriate insight and judgment.  ____________________________________________    LABS  (pertinent positives/negatives)  CBC wbc 14.7, hgb 13.3, plt 293 BMP na 140, k 3.8, glu 155, cr 1.29 ____________________________________________   EKG  I, Phineas Semen, attending physician, personally viewed and interpreted this EKG  EKG Time: 1522 Rate: 187 Rhythm: SVT Axis: normal Intervals: qtc 430 QRS: narrow, q waves v1 ST changes: no st elevation Impression: abnormal ekg  I, Phineas Semen, attending physician, personally viewed and interpreted this EKG  EKG Time: 1557 Rate: 86 Rhythm: sinus rhythm Axis: normal Intervals: qtc 459 QRS: narrow ST changes: no st elevation Impression: normal ekg ____________________________________________    RADIOLOGY  None  ____________________________________________   PROCEDURES  Procedures  ____________________________________________   INITIAL IMPRESSION / ASSESSMENT AND PLAN / ED COURSE  Pertinent labs & imaging results that were available during my care of the patient were reviewed by me and considered in my medical decision making (see chart for details).   Patient presented to the emergency department today because of concerns  for chest pain and tachycardia.  Patient was found to be in SVT.  This did break with vagal maneuvers here in the emergency department.  Patient's blood work without concerning electrolyte abnormality.  I discussed slight elevation of creatinine given some concern for possible dehydration.  Patient's subsequent EKG without concerning findings.  Patient was observed in the emergency department for a couple of hours without any further episodes of SVT.  Will discharge follow-up with cardiology.  ____________________________________________   FINAL CLINICAL IMPRESSION(S) / ED DIAGNOSES  Final diagnoses:  SVT (supraventricular tachycardia) (HCC)     Note: This dictation was prepared with Dragon dictation. Any transcriptional errors that result from this process are unintentional      Phineas Semen, MD 07/15/20 [phone]

## 2020-07-15 NOTE — ED Triage Notes (Signed)
Pt comes into the ED via POV c/o tachycardia with a h/o of SVT.  Pt diaphoretic and clammy upon arrival. Pt also was [facility].

## 2020-07-15 NOTE — ED Notes (Signed)
Pt able to bare down with initial RN from triage and was able to bring HR down to the 60-70's.  HR still irregular at this time. New EKG captured and given to MD.

## 2020-07-20 ENCOUNTER — Ambulatory Visit (INDEPENDENT_AMBULATORY_CARE_PROVIDER_SITE_OTHER): Payer: BC Managed Care – PPO | Admitting: Nurse Practitioner

## 2020-07-20 ENCOUNTER — Other Ambulatory Visit: Payer: Self-pay

## 2020-07-20 ENCOUNTER — Encounter: Payer: Self-pay | Admitting: Nurse Practitioner

## 2020-07-20 VITALS — BP 106/60 | HR 69 | Ht 66.0 in | Wt 161.0 lb

## 2020-07-20 DIAGNOSIS — I10 Essential (primary) hypertension: Secondary | ICD-10-CM

## 2020-07-20 DIAGNOSIS — I471 Supraventricular tachycardia, unspecified: Secondary | ICD-10-CM

## 2020-07-20 DIAGNOSIS — N179 Acute kidney failure, unspecified: Secondary | ICD-10-CM | POA: Diagnosis not present

## 2020-07-20 NOTE — Patient Instructions (Signed)
Medication Instructions:  - Your physician recommends that you continue on your current medications as directed. Please refer to the Current Medication list given to you today.  *If you need a refill on your cardiac medications before your next appointment, please call your pharmacy*   Lab Work: - Your physician recommends that you have lab work today: BMP  If you have labs (blood work) drawn today and your tests are completely normal, you will receive your results only by: Marland Kitchen MyChart Message (if you have MyChart) OR . A paper copy in the mail If you have any lab test that is abnormal or we need to change your treatment, we will call you to review the results.   Testing/Procedures: - none ordered   Follow-Up: At [facility], you and your health needs are our priority.  As part of our continuing mission to provide you with exceptional heart care, we have created designated Provider Care Teams.  These Care Teams include your primary Cardiologist (physician) and Advanced Practice Providers (APPs -  Physician Assistants and Nurse Practitioners) who all work together to provide you with the care you need, when you need it.  We recommend signing up for the patient portal called "MyChart".  Sign up information is provided on this After Visit Summary.  MyChart is used to connect with patients for Virtual Visits (Telemedicine).  Patients are able to view lab/test results, encounter notes, upcoming appointments, etc.  Non-urgent messages can be sent to your provider as well.   To learn more about what you can do with MyChart, go to [email or web address].    Your next appointment:   - You have been referred to: Dr. Steffanie Dunn for further evaluation of your SVT   The format for your next appointment:   In Person  Provider:   Steffanie Dunn, MD   Other Instructions

## 2020-07-20 NOTE — Progress Notes (Signed)
Office Visit    Patient Name: Tammy Acevedo Date of Encounter: 07/20/2020  Primary Care Provider:  Reubin Milan, MD Primary Cardiologist:  Julien Nordmann, MD  Chief Complaint    49 year old female with a history of PSVT, chest pain, hypertension, and mild mitral regurgitation, who presents for follow-up related to recurrent SVT and recent ED visit.  Past Medical History    Past Medical History:  Diagnosis Date  . Chest pain    a. 08/2015 ETT: Ex time 9:01, Max HR 166, No ECG changes->Nl study.  Marland Kitchen COVID-19 08/31/2019  . Family history of breast cancer   . Family history of lung cancer   . Family history of throat cancer   . Hypertension   . Mild Mitral regurgitation    a. 08/2015 Echo: EF 50%, no rwma, Triv TR/PR, mild MR.  . SVT (supraventricular tachycardia) (HCC)   . Wears dentures    partial upper   Past Surgical History:  Procedure Laterality Date  . BREAST BIOPSY Left 08/11/2019   distortion, coil marker, radial scar and ADH  . BREAST BIOPSY Left 10/07/2019   Procedure: BREAST BIOPSY WITH NEEDLE LOCALIZATION;  Surgeon: Campbell Lerner, MD;  Location: [facility];  Service: General;  Laterality: Left;  . BREAST LUMPECTOMY Left 10/07/2019   radial scar, ADH, dense stromal fibrosis excisied, clear surgical margins  . CARPAL TUNNEL RELEASE    . CATARACT EXTRACTION W/PHACO Right 07/13/2020   Procedure: CATARACT EXTRACTION PHACO AND INTRAOCULAR LENS PLACEMENT (IOC) RIGHT;  Surgeon: Lockie Mola, MD;  Location: [facility];  Service: Ophthalmology;  Laterality: Right;  7.53 1:21.3 9.3%  . CHOLECYSTECTOMY    . COLONOSCOPY WITH PROPOFOL N/A 12/07/2019   Procedure: COLONOSCOPY WITH PROPOFOL;  Surgeon: Midge Minium, MD;  Location: [facility];  Service: Endoscopy;  Laterality: N/A;  . TUBAL LIGATION      Allergies  No Known Allergies  History of Present Illness    49 year old female with the above past medical history including  supraventricular tachycardia, chest pain, hypertension, and mild mitral regurgitation.  She was previously evaluated at [facility] clinic in 2016 in the setting of chest pain, with stress testing showing good exercise tolerance and no acute ST or T changes.  Echocardiogram at that time showed an EF of 50% without regional wall motion abnormalities.  Mild mitral regurgitation was noted.  She has done well over the years on beta-blocker therapy with only limited SVT (12 minutes reported in April 2020).  She was diagnosed with COVID-19 in December 2020, which was only found in the setting of preoperative testing.  She did not have any significant symptoms.  Following this, she underwent a lumpectomy of the left breast in January 2021 in the setting of focal atypical ductal hyperplasia.  She tolerated this well and has been followed by oncology since, and is currently taking tamoxifen therapy.  Though she is doing well at her last cardiology visit in April 2021, she was seen in the ED on October 15 in the setting of recurrent tachypalpitations, headache, and chest pressure that occurred while cleaning the house.  She was found to be in supraventricular tachycardia where the rhythm broke with vagal maneuvers.  Lab work was notable for mild leukocytosis (14.7) and mild creatinine elevation at 1.29.  She was observed in the ED for several hours and then discharged home.  Since her ER visit, she has done well w/o recurrent symptoms.  She isn't sure if there was a particular trigger  leading to her SVT episode on 10/15, she notes that she did have cataract surgery on October 13 (uneventful), and a Pepsi on the evening of October 14.  She says that in general, she tries to stay well-hydrated and for the most part avoids caffeine.  She has been compliant with beta-blocker therapy.    Home Medications    Prior to Admission medications   Medication Sig Start Date End Date Taking? Authorizing Provider  acetaminophen  (TYLENOL) 500 MG tablet Take 1,000 mg by mouth every 6 (six) hours as needed for moderate pain.     [provider]  ELDERBERRY PO Take by mouth daily.    [provider]  Loratadine 10 MG CAPS Take by mouth daily.    [provider]  metoprolol tartrate (LOPRESSOR) 25 MG tablet Take 1 tablet by mouth twice daily 04/18/20   Antonieta Iba, MD  tamoxifen (NOLVADEX) 20 MG tablet Take 1 tablet (20 mg total) by mouth daily. 04/22/20   Creig Hines, MD    Review of Systems    As above, she did have an episode of palpitations prompting ER visit on October 15.  Since then, she has done well without chest pain, dyspnea, palpitations, PND, orthopnea, dizziness, syncope, edema, or early satiety.  All other systems reviewed and are otherwise negative except as noted above.  Physical Exam    VS:  BP 106/60 (BP Location: Left Arm, Patient Position: Sitting, Cuff Size: Normal)   Pulse 69   Ht 5\' 6"  (1.676 m)   Wt 161 lb (73 kg)   BMI 25.99 kg/m  , BMI Body mass index is 25.99 kg/m. GEN: Well nourished, well developed, in no acute distress. HEENT: normal. Neck: Supple, no JVD, carotid bruits, or masses. Cardiac: RRR, no murmurs, rubs, or gallops. No clubbing, cyanosis, edema.  Radials/PT 2+ and equal bilaterally.  Respiratory:  Respirations regular and unlabored, clear to auscultation bilaterally. GI: Soft, nontender, nondistended, BS + x 4. MS: no deformity or atrophy. Skin: warm and dry, no rash. Neuro:  Strength and sensation are intact. Psych: Normal affect.  Accessory Clinical Findings    ECG personally reviewed by me today -regular sinus rhythm, 69- no acute changes.  Lab Results  Component Value Date   WBC 14.7 (H) 07/15/2020   HGB 13.3 07/15/2020   HCT 39.1 07/15/2020   MCV 85.4 07/15/2020   PLT 293 07/15/2020   Lab Results  Component Value Date   CREATININE 1.29 (H) 07/15/2020   BUN 13 07/15/2020   NA 140 07/15/2020   K 3.8 07/15/2020   CL 104  07/15/2020   CO2 24 07/15/2020   Lab Results  Component Value Date   ALT 11 09/29/2019   AST 15 09/29/2019   ALKPHOS 78 09/29/2019   BILITOT 0.5 09/29/2019   Lab Results  Component Value Date   CHOL 150 09/29/2019   HDL 47 09/29/2019   LDLCALC 87 09/29/2019   TRIG 86 09/29/2019   CHOLHDL 3.2 09/29/2019     Assessment & Plan    1.  PSVT: Patient with a prior history of PSVT which has been well managed with oral beta-blocker therapy.  She had recurrent, more prolonged tachypalpitations and PSVT on October 15 prompting ED visit.  While there, ECG confirmed SVT at 187.  Inferolateral ST depression was noted during tachycardia.  Lab work notable for leukocytosis with a white count of 14.7 and acute kidney injury with a creatinine of 1.29.  She has had  no recurrence of symptoms and reports good compliance with beta-blocker therapy.  She tries to hydrate well and does not think that that day was out of the ordinary in any way although, she did have cataract surgery 2 days prior and a can of Pepsi the night before.  Her resting heart rate is in the 60s and blood pressure is generally soft-106/60 today.  We discussed options for management.  Over the past 2 years, she has had 2 episodes of SVT, with a 12-hour episode in 2020 and then this episode last week.  I will continue beta-blocker therapy at current dosing and she was willing to accept a referral to electrophysiology to discuss ablation further.  This has been discussed with her before and she was reluctant in the past, but now seems more willing to proceed.  2.  Acute kidney injury: Creatinine was 1.29 in the emergency department October 15.  This was previously normal in December 2020.  I will follow-up a basic metabolic panel today.  3.  Abnormal ECG: In the setting of SVT at 187, she did have inferolateral ST depression.  ECG today is normal.  Troponins were not evaluated.  She did experience chest pressure in the setting of rapid heart  rates.  She has had no recurrence of palpitations or chest pressure.  She was previously evaluated with stress testing in 2016 which was normal.  Echocardiogram at that time showed an EF of 50% without regional wall motion abnormalities.  Would have a low threshold to repeat ischemic testing in the setting of any recurrence of chest discomfort.  4.  Essential hypertension: This is well controlled and blood pressure is actually soft on metoprolol therapy.  5.  Disposition: Follow-up basic metabolic panel today given recent acute kidney injury in the emergency department.  Follow-up with EP to discuss ablative therapy for SVT.   Nicolasa Ducking, NP 07/20/2020, 2:53 PM

## 2020-07-21 LAB — BASIC METABOLIC PANEL
BUN/Creatinine Ratio: 11 (ref 9–23)
BUN: 9 mg/dL (ref 6–24)
CO2: 27 mmol/L (ref 20–29)
Calcium: 9.1 mg/dL (ref 8.7–10.2)
Chloride: 103 mmol/L (ref 96–106)
Creatinine, Ser: 0.84 mg/dL (ref 0.57–1.00)
GFR calc Af Amer: 93 mL/min/{1.73_m2} (ref >59)
GFR calc non Af Amer: 81 mL/min/{1.73_m2} (ref >59)
Glucose: 100 mg/dL — ABNORMAL HIGH (ref 65–99)
Potassium: 3.9 mmol/L (ref 3.5–5.2)
Sodium: 142 mmol/L (ref 134–144)

## 2020-07-27 DIAGNOSIS — I10 Essential (primary) hypertension: Secondary | ICD-10-CM | POA: Diagnosis not present

## 2020-07-27 DIAGNOSIS — H2512 Age-related nuclear cataract, left eye: Secondary | ICD-10-CM | POA: Diagnosis not present

## 2020-07-28 ENCOUNTER — Encounter: Payer: Self-pay | Admitting: Ophthalmology

## 2020-08-01 ENCOUNTER — Other Ambulatory Visit: Payer: Self-pay

## 2020-08-01 ENCOUNTER — Other Ambulatory Visit
Admission: RE | Admit: 2020-08-01 | Discharge: 2020-08-01 | Disposition: A | Discharge status: Home / Self Care | Payer: BC Managed Care – PPO | Source: Ambulatory Visit | Attending: Ophthalmology | Admitting: Ophthalmology

## 2020-08-01 DIAGNOSIS — Z01812 Encounter for preprocedural laboratory examination: Secondary | ICD-10-CM | POA: Insufficient documentation

## 2020-08-01 DIAGNOSIS — Z20822 Contact with and (suspected) exposure to covid-19: Secondary | ICD-10-CM | POA: Diagnosis not present

## 2020-08-02 LAB — SARS CORONAVIRUS 2 (TAT 6-24 HRS): SARS Coronavirus 2: NEGATIVE

## 2020-08-02 NOTE — Discharge Instructions (Signed)

## 2020-08-03 ENCOUNTER — Ambulatory Visit: Payer: BC Managed Care – PPO | Admitting: Anesthesiology

## 2020-08-03 ENCOUNTER — Encounter
Admission: RE | Disposition: A | Discharge status: Home / Self Care | Payer: Self-pay | Source: Home / Self Care | Attending: Ophthalmology

## 2020-08-03 ENCOUNTER — Other Ambulatory Visit: Payer: Self-pay

## 2020-08-03 ENCOUNTER — Ambulatory Visit
Admission: RE | Admit: 2020-08-03 | Discharge: 2020-08-03 | Disposition: A | Discharge status: Home / Self Care | Payer: BC Managed Care – PPO | Attending: Ophthalmology | Admitting: Ophthalmology

## 2020-08-03 DIAGNOSIS — H2512 Age-related nuclear cataract, left eye: Secondary | ICD-10-CM | POA: Insufficient documentation

## 2020-08-03 DIAGNOSIS — H25812 Combined forms of age-related cataract, left eye: Secondary | ICD-10-CM | POA: Diagnosis not present

## 2020-08-03 HISTORY — PX: CATARACT EXTRACTION W/PHACO: SHX586

## 2020-08-03 SURGERY — PHACOEMULSIFICATION, CATARACT, WITH IOL INSERTION
Anesthesia: Monitor Anesthesia Care | Site: Eye | Laterality: Left

## 2020-08-03 MED ORDER — MIDAZOLAM HCL 2 MG/2ML IJ SOLN
INTRAMUSCULAR | Status: DC | PRN
  Administered 2020-08-03: 1 mg via INTRAVENOUS

## 2020-08-03 MED ORDER — NA HYALUR & NA CHOND-NA HYALUR 0.4-0.35 ML IO KIT
PACK | INTRAOCULAR | Status: DC | PRN
  Administered 2020-08-03: 1 mL via INTRAOCULAR

## 2020-08-03 MED ORDER — BRIMONIDINE TARTRATE-TIMOLOL 0.2-0.5 % OP SOLN
OPHTHALMIC | Status: DC | PRN
  Administered 2020-08-03: 1 [drp] via OPHTHALMIC

## 2020-08-03 MED ORDER — ARMC OPHTHALMIC DILATING DROPS
1.0000 "application " | OPHTHALMIC | Status: DC | PRN
  Administered 2020-08-03 (×3): 1 via OPHTHALMIC

## 2020-08-03 MED ORDER — TETRACAINE HCL 0.5 % OP SOLN
1.0000 [drp] | OPHTHALMIC | Status: DC | PRN
  Administered 2020-08-03 (×3): 1 [drp] via OPHTHALMIC

## 2020-08-03 MED ORDER — CEFUROXIME OPHTHALMIC INJECTION 1 MG/0.1 ML
INJECTION | OPHTHALMIC | Status: DC | PRN
  Administered 2020-08-03: 0.1 mL via INTRACAMERAL

## 2020-08-03 MED ORDER — FENTANYL CITRATE (PF) 100 MCG/2ML IJ SOLN
INTRAMUSCULAR | Status: DC | PRN
  Administered 2020-08-03: 50 ug via INTRAVENOUS

## 2020-08-03 MED ORDER — EPINEPHRINE PF 1 MG/ML IJ SOLN
INTRAOCULAR | Status: DC | PRN
  Administered 2020-08-03: 60 mL via OPHTHALMIC

## 2020-08-03 MED ORDER — LACTATED RINGERS IV SOLN: INTRAVENOUS | Status: DC

## 2020-08-03 MED ORDER — MOXIFLOXACIN HCL 0.5 % OP SOLN
1.0000 [drp] | OPHTHALMIC | Status: DC | PRN
  Administered 2020-08-03 (×3): 1 [drp] via OPHTHALMIC

## 2020-08-03 MED ORDER — LIDOCAINE HCL (PF) 2 % IJ SOLN
INTRAOCULAR | Status: DC | PRN
  Administered 2020-08-03: 1 mL

## 2020-08-03 SURGICAL SUPPLY — 23 items
CANNULA ANT/CHMB 27G (MISCELLANEOUS) ×1 IMPLANT
CANNULA ANT/CHMB 27GA (MISCELLANEOUS) ×2 IMPLANT
GLOVE SURG LX 7.5 STRW (GLOVE) ×1
GLOVE SURG LX STRL 7.5 STRW (GLOVE) ×1 IMPLANT
GLOVE SURG TRIUMPH 8.0 PF LTX (GLOVE) ×2 IMPLANT
GOWN STRL REUS W/ TWL LRG LVL3 (GOWN DISPOSABLE) ×2 IMPLANT
GOWN STRL REUS W/TWL LRG LVL3 (GOWN DISPOSABLE) ×4
LENS IOL DIOP 25.0 (Intraocular Lens) ×2 IMPLANT
LENS IOL TECNIS MONO 25.0 (Intraocular Lens) IMPLANT
MARKER SKIN DUAL TIP RULER LAB (MISCELLANEOUS) ×2 IMPLANT
NDL CAPSULORHEX 25GA (NEEDLE) ×1 IMPLANT
NDL FILTER BLUNT 18X1 1/2 (NEEDLE) ×2 IMPLANT
NEEDLE CAPSULORHEX 25GA (NEEDLE) ×2 IMPLANT
NEEDLE FILTER BLUNT 18X 1/2SAF (NEEDLE) ×2
NEEDLE FILTER BLUNT 18X1 1/2 (NEEDLE) ×2 IMPLANT
PACK CATARACT BRASINGTON (MISCELLANEOUS) ×2 IMPLANT
PACK EYE AFTER SURG (MISCELLANEOUS) ×2 IMPLANT
PACK OPTHALMIC (MISCELLANEOUS) ×2 IMPLANT
SOLUTION OPHTHALMIC SALT (MISCELLANEOUS) ×2 IMPLANT
SYR 3ML LL SCALE MARK (SYRINGE) ×4 IMPLANT
SYR TB 1ML LUER SLIP (SYRINGE) ×2 IMPLANT
WATER STERILE IRR 250ML POUR (IV SOLUTION) ×2 IMPLANT
WIPE NON LINTING 3.25X3.25 (MISCELLANEOUS) ×2 IMPLANT

## 2020-08-03 NOTE — H&P (Signed)

## 2020-08-03 NOTE — Anesthesia Procedure Notes (Signed)
Procedure Name: MAC Date/Time: 08/03/2020 12:56 PM Performed by: Cameron Ali, CRNA Pre-anesthesia Checklist: Patient identified, Emergency Drugs available, Suction available, Timeout performed and Patient being monitored Patient Re-evaluated:Patient Re-evaluated prior to induction Oxygen Delivery Method: Nasal cannula Placement Confirmation: positive ETCO2

## 2020-08-03 NOTE — Op Note (Signed)
OPERATIVE NOTE  Tammy Acevedo [id number] 08/03/2020   PREOPERATIVE DIAGNOSIS:  Nuclear sclerotic cataract left eye. H25.12   POSTOPERATIVE DIAGNOSIS:    Nuclear sclerotic cataract left eye.     PROCEDURE:  Phacoemusification with posterior chamber intraocular lens placement of the left eye  Ultrasound time: Procedure(s): CATARACT EXTRACTION PHACO AND INTRAOCULAR LENS PLACEMENT (IOC) LEFT 4.52 00:44.3 10.3% (Left)  LENS:   Implant Name Type Inv. Item Serial No. Manufacturer Lot No. LRB No. Used Action  LENS IOL DIOP 25.0 - [id number] Intraocular Lens LENS IOL DIOP 25.0 [id number]   Left 1 Implanted      SURGEON:  Deirdre Evener, MD   ANESTHESIA:  Topical with tetracaine drops and 2% Xylocaine jelly, augmented with 1% preservative-free intracameral lidocaine.    COMPLICATIONS:  None.   DESCRIPTION OF PROCEDURE:  The patient was identified in the holding room and transported to the operating room and placed in the supine position under the operating microscope.  The left eye was identified as the operative eye and it was prepped and draped in the usual sterile ophthalmic fashion.   A 1 millimeter clear-corneal paracentesis was made at the 1:30 position.  0.5 ml of preservative-free 1% lidocaine was injected into the anterior chamber.  The anterior chamber was filled with Viscoat viscoelastic.  A 2.4 millimeter keratome was used to make a near-clear corneal incision at the 10:30 position.  .  A curvilinear capsulorrhexis was made with a cystotome and capsulorrhexis forceps.  Balanced salt solution was used to hydrodissect and hydrodelineate the nucleus.   Phacoemulsification was then used in stop and chop fashion to remove the lens nucleus and epinucleus.  The remaining cortex was then removed using the irrigation and aspiration handpiece. Provisc was then placed into the capsular bag to distend it for lens placement.  A lens was then injected into the capsular  bag.  The remaining viscoelastic was aspirated.   Wounds were hydrated with balanced salt solution.  The anterior chamber was inflated to a physiologic pressure with balanced salt solution.  No wound leaks were noted. Cefuroxime 0.1 ml of a 10mg /ml solution was injected into the anterior chamber for a dose of 1 mg of intracameral antibiotic at the completion of the case.   Timolol and Brimonidine drops were applied to the eye.  The patient was taken to the recovery room in stable condition without complications of anesthesia or surgery.  [name] 08/03/2020, 1:19 PM

## 2020-08-03 NOTE — Anesthesia Postprocedure Evaluation (Signed)
Anesthesia Post Note  Patient: Tammy Acevedo  Procedure(s) Performed: CATARACT EXTRACTION PHACO AND INTRAOCULAR LENS PLACEMENT (IOC) LEFT 4.52 00:44.3 10.3% (Left Eye)     Patient location during evaluation: PACU Anesthesia Type: MAC Level of consciousness: awake and alert Pain management: pain level controlled Vital Signs Assessment: post-procedure vital signs reviewed and stable Respiratory status: spontaneous breathing Cardiovascular status: stable Anesthetic complications: no   No complications documented.  Gillian Scarce

## 2020-08-03 NOTE — Anesthesia Preprocedure Evaluation (Addendum)
Anesthesia Evaluation  Patient identified by MRN, date of birth, ID band Patient awake    Reviewed: Allergy & Precautions, H&P , NPO status , Patient's Chart, lab work & pertinent test results  Airway Mallampati: II  TM Distance: >3 FB Neck ROM: full    Dental no notable dental hx.    Pulmonary neg pulmonary ROS,    Pulmonary exam normal breath sounds clear to auscultation       Cardiovascular hypertension, On Medications + dysrhythmias Supra Ventricular Tachycardia  Rhythm:regular Rate:Normal  H/o SVT- went to ER after previous cataract.  Took metoprolol this am- feels well and in normal sinus this am.   Neuro/Psych    GI/Hepatic Neg liver ROS, Medicated,  Endo/Other  negative endocrine ROS  Renal/GU negative Renal ROS  negative genitourinary   Musculoskeletal   Abdominal   Peds  Hematology negative hematology ROS (+)   Anesthesia Other Findings   Reproductive/Obstetrics                            Anesthesia Physical Anesthesia Plan  ASA: II  Anesthesia Plan: MAC   Post-op Pain Management:    Induction:   PONV Risk Score and Plan: 2 and Treatment may vary due to age or medical condition  Airway Management Planned:   Additional Equipment:   Intra-op Plan:   Post-operative Plan:   Informed Consent: I have reviewed the patients History and Physical, chart, labs and discussed the procedure including the risks, benefits and alternatives for the proposed anesthesia with the patient or authorized representative who has indicated his/her understanding and acceptance.       Plan Discussed with:   Anesthesia Plan Comments:         Anesthesia Quick Evaluation

## 2020-08-03 NOTE — Transfer of Care (Signed)
Immediate Anesthesia Transfer of Care Note  Patient: Tammy Acevedo  Procedure(s) Performed: CATARACT EXTRACTION PHACO AND INTRAOCULAR LENS PLACEMENT (IOC) LEFT 4.52 00:44.3 10.3% (Left Eye)  Patient Location: PACU  Anesthesia Type: MAC  Level of Consciousness: awake, alert  and patient cooperative  Airway and Oxygen Therapy: Patient Spontanous Breathing and Patient connected to supplemental oxygen  Post-op Assessment: Post-op Vital signs reviewed, Patient's Cardiovascular Status Stable, Respiratory Function Stable, Patent Airway and No signs of Nausea or vomiting  Post-op Vital Signs: Reviewed and stable  Complications: No complications documented.

## 2020-08-04 ENCOUNTER — Encounter: Payer: Self-pay | Admitting: Ophthalmology

## 2020-08-10 ENCOUNTER — Institutional Professional Consult (permissible substitution): Payer: BC Managed Care – PPO | Admitting: Cardiology

## 2020-08-23 ENCOUNTER — Other Ambulatory Visit: Payer: Self-pay | Admitting: Obstetrics and Gynecology

## 2020-08-23 DIAGNOSIS — Z1231 Encounter for screening mammogram for malignant neoplasm of breast: Secondary | ICD-10-CM

## 2020-08-30 ENCOUNTER — Ambulatory Visit
Admission: RE | Admit: 2020-08-30 | Discharge: 2020-08-30 | Disposition: A | Discharge status: Home / Self Care | Payer: BC Managed Care – PPO | Source: Ambulatory Visit | Attending: Obstetrics and Gynecology | Admitting: Obstetrics and Gynecology

## 2020-08-30 ENCOUNTER — Other Ambulatory Visit: Payer: Self-pay

## 2020-08-30 DIAGNOSIS — Z1231 Encounter for screening mammogram for malignant neoplasm of breast: Secondary | ICD-10-CM | POA: Diagnosis not present

## 2020-09-01 ENCOUNTER — Other Ambulatory Visit: Payer: Self-pay | Admitting: Oncology

## 2020-09-07 ENCOUNTER — Ambulatory Visit (INDEPENDENT_AMBULATORY_CARE_PROVIDER_SITE_OTHER): Payer: BC Managed Care – PPO | Admitting: Cardiology

## 2020-09-07 ENCOUNTER — Other Ambulatory Visit: Payer: Self-pay

## 2020-09-07 ENCOUNTER — Encounter: Payer: Self-pay | Admitting: Cardiology

## 2020-09-07 VITALS — BP 110/70 | HR 62 | Ht 65.0 in | Wt 164.6 lb

## 2020-09-07 DIAGNOSIS — I10 Essential (primary) hypertension: Secondary | ICD-10-CM | POA: Diagnosis not present

## 2020-09-07 DIAGNOSIS — I471 Supraventricular tachycardia: Secondary | ICD-10-CM

## 2020-09-07 NOTE — Progress Notes (Signed)
Electrophysiology Office Note:    Date:  09/07/2020   ID:  Tammy Acevedo, DOB [birth year], MRN [id number]  PCP:  Reubin Milan, MD  [facility] Cardiologist:  Julien Nordmann, MD  [facility] Electrophysiologist:  Lanier Prude, MD   Referring MD: Reubin Milan, MD   Chief Complaint: SVT  History of Present Illness:    Tammy Acevedo is a 49 y.o. female who presents for an evaluation of SVT at the request of Dr. Judithann Graves and Ward Givens, PA-C. Their medical history includes mild mitral vegetation, hypertension, COVID-19.  She has a long history of SVT and was previously seen by the [facility].  She has been maintained on beta-blockers which seemed to keep her symptoms controlled.  She last saw Ward Givens on July 20, 2020.  This was shortly after presentation to the emergency department for an episode of SVT with a heart rate of 187 bpm that occurred while she was cleaning the house.  Ultimately, the tachycardia resolved with vagal maneuvers.  No clear triggers for the episode that day.  Today she tells me that the episodes of tachycardia seem to be occurring more frequently.  Thankfully they have not lasted long enough to require presentation to the emergency department.  She feels like she has brief flutters at work that occur without clear triggers.  They last seconds at a time.  After the more prolonged episodes such as the one recently that led to the emergency department presentation, she is fatigued and "wiped out" for several days.  The last episode occurred despite treatment with metoprolol.  Interestingly, her brother also has SVT and has required a recent ablation.  She works at a group home.  Past Medical History:  Diagnosis Date  . Chest pain    a. 08/2015 ETT: Ex time 9:01, Max HR 166, No ECG changes->Nl study.  Marland Kitchen COVID-19 08/31/2019  . Family history of breast cancer   . Family history of lung cancer   . Family history of throat  cancer   . Hypertension   . Mild Mitral regurgitation    a. 08/2015 Echo: EF 50%, no rwma, Triv TR/PR, mild MR.  . SVT (supraventricular tachycardia) (HCC)   . Wears dentures    partial upper    Past Surgical History:  Procedure Laterality Date  . BREAST BIOPSY Left 08/11/2019   distortion, coil marker, radial scar and ADH  . BREAST BIOPSY Left 10/07/2019   Procedure: BREAST BIOPSY WITH NEEDLE LOCALIZATION;  Surgeon: Campbell Lerner, MD;  Location: [facility] ORS;  Service: General;  Laterality: Left;  . BREAST LUMPECTOMY Left 10/07/2019   radial scar, ADH, dense stromal fibrosis excisied, clear surgical margins  . CARPAL TUNNEL RELEASE    . CATARACT EXTRACTION W/PHACO Right 07/13/2020   Procedure: CATARACT EXTRACTION PHACO AND INTRAOCULAR LENS PLACEMENT (IOC) RIGHT;  Surgeon: Lockie Mola, MD;  Location: [facility];  Service: Ophthalmology;  Laterality: Right;  7.53 1:21.3 9.3%  . CATARACT EXTRACTION W/PHACO Left 08/03/2020   Procedure: CATARACT EXTRACTION PHACO AND INTRAOCULAR LENS PLACEMENT (IOC) LEFT 4.52 00:44.3 10.3%;  Surgeon: Lockie Mola, MD;  Location: [facility];  Service: Ophthalmology;  Laterality: Left;  . CHOLECYSTECTOMY    . COLONOSCOPY WITH PROPOFOL N/A 12/07/2019   Procedure: COLONOSCOPY WITH PROPOFOL;  Surgeon: Midge Minium, MD;  Location: [facility];  Service: Endoscopy;  Laterality: N/A;  . TUBAL LIGATION      Current Medications: Current Meds  Medication Sig  . acetaminophen (TYLENOL)  500 MG tablet Take 1,000 mg by mouth every 6 (six) hours as needed for moderate pain.   Marland Kitchen ELDERBERRY PO Take by mouth daily.  . Loratadine 10 MG CAPS Take by mouth daily.  . metoprolol tartrate (LOPRESSOR) 25 MG tablet Take 1 tablet by mouth twice daily  . tamoxifen (NOLVADEX) 20 MG tablet Take 1 tablet by mouth once daily     Allergies:   Patient has no known allergies.   Social History   Socioeconomic History  . Marital status: Married     Spouse name: Not on file  . Number of children: 2  . Years of education: Not on file  . Highest education level: Not on file  Occupational History  . Not on file  Tobacco Use  . Smoking status: Never Smoker  . Smokeless tobacco: Never Used  Vaping Use  . Vaping Use: Never used  Substance and Sexual Activity  . Alcohol use: No    Alcohol/week: 0.0 standard drinks  . Drug use: No  . Sexual activity: Not Currently  Other Topics Concern  . Not on file  Social History Narrative  . Not on file   Social Determinants of Health   Financial Resource Strain:   . Difficulty of Paying Living Expenses: Not on file  Food Insecurity:   . Worried About Programme researcher, broadcasting/film/video in the Last Year: Not on file  . Ran Out of Food in the Last Year: Not on file  Transportation Needs:   . Lack of Transportation (Medical): Not on file  . Lack of Transportation (Non-Medical): Not on file  Physical Activity:   . Days of Exercise per Week: Not on file  . Minutes of Exercise per Session: Not on file  Stress:   . Feeling of Stress : Not on file  Social Connections:   . Frequency of Communication with Friends and Family: Not on file  . Frequency of Social Gatherings with Friends and Family: Not on file  . Attends Religious Services: Not on file  . Active Member of Clubs or Organizations: Not on file  . Attends Banker Meetings: Not on file  . Marital Status: Not on file     Family History: The patient's family history includes Breast cancer in her cousin; Breast cancer (age of onset: 32) in her maternal aunt; COPD in her father; Cancer in her paternal aunt; Diabetes in her mother; Lung cancer (age of onset: 82) in her father; Throat cancer in her cousin.  ROS:   Please see the history of present illness.    All other systems reviewed and are negative.  EKGs/Labs/Other Studies Reviewed:    The following studies were reviewed today: Prior hospitalization records, office notes,  EKG  EKG:  The ekg ordered today demonstrates sinus rhythm.  Normal intervals.  No evidence of preexcitation.  Ventricular rate of 64 bpm.  July 15, 2020 EKG shows narrow complex tachycardia.  This is a short RP tachycardia with a small P wave, likely superiorly directed, seen at the terminal portion of the QRS in lead V1 in the inferior leads.     Recent Labs: 09/29/2019: ALT 11; TSH 1.310 07/15/2020: Hemoglobin 13.3; Platelets 293 07/20/2020: BUN 9; Creatinine, Ser 0.84; Potassium 3.9; Sodium 142  Recent Lipid Panel    Component Value Date/Time   CHOL 150 09/29/2019 1044   TRIG 86 09/29/2019 1044   HDL 47 09/29/2019 1044   CHOLHDL 3.2 09/29/2019 1044   CHOLHDL 4 12/15/2012 0857  VLDL 14.4 12/15/2012 0857   LDLCALC 87 09/29/2019 1044    Physical Exam:    VS:  BP 110/70   Pulse 62   Ht 5\' 5"  (1.651 m)   Wt 164 lb 9.6 oz (74.7 kg)   SpO2 99%   BMI 27.39 kg/m     Wt Readings from Last 3 Encounters:  09/07/20 164 lb 9.6 oz (74.7 kg)  08/03/20 160 lb 15 oz (73 kg)  07/20/20 161 lb (73 kg)     GEN:  Well nourished, well developed in no acute distress HEENT: Normal NECK: No JVD; No carotid bruits LYMPHATICS: No lymphadenopathy CARDIAC: RRR, no murmurs, rubs, gallops RESPIRATORY:  Clear to auscultation without rales, wheezing or rhonchi  ABDOMEN: Soft, non-tender, non-distended MUSCULOSKELETAL:  No edema; No deformity  SKIN: Warm and dry NEUROLOGIC:  Alert and oriented x 3 PSYCHIATRIC:  Normal affect   ASSESSMENT:    1. PSVT (paroxysmal supraventricular tachycardia) (HCC)   2. Essential hypertension    PLAN:    In order of problems listed above:  1. 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  We discussed management options including continuing conservative management with metoprolol versus ablation for more definitive treatment.  Given the last episode  occurred while she was taking metoprolol and her relatively low resting heart rate, she would like to pursue ablation therapy.  Discussed the ablation procedure in detail with the patient during today's visit including the expected recovery time, associated risks and she would like to proceed with scheduling.  We will get an echocardiogram prior to the procedure.  Risk, benefits, and alternatives to EP study and radiofrequency ablation for SVT were also discussed in detail today. These risks include but are not limited to complete heart block, stroke, bleeding, vascular damage, tamponade, perforation, and death. The patient understands these risk and wishes to proceed.  We will therefore proceed with catheter ablation at the next available time.  Carto and ICE are requested for the procedure.  Will also obtain an echo prior to the procedure.  2.  Hypertension Controlled on metoprolol.  Medication Adjustments/Labs and Tests Ordered: Current medicines are reviewed at length with the patient today.  Concerns regarding medicines are outlined above.  Orders Placed This Encounter  Procedures  . EKG 12-Lead   No orders of the defined types were placed in this encounter.    Signed, 07/22/20, MD, [facility]  09/07/2020 10:54 AM    Electrophysiology [facility]

## 2020-09-07 NOTE — Patient Instructions (Addendum)
Medication Instructions:  Your physician recommends that you continue on your current medications as directed. Please refer to the Current Medication list given to you today. *If you need a refill on your cardiac medications before your next appointment, please call your pharmacy*  Lab Work: None ordered. If you have labs (blood work) drawn today and your tests are completely normal, you will receive your results only by: Marland Kitchen MyChart Message (if you have MyChart) OR . A paper copy in the mail If you have any lab test that is abnormal or we need to change your treatment, we will call you to review the results.  Testing/Procedures: Your physician has recommended that you have an ablation. Catheter ablation is a medical procedure used to treat some cardiac arrhythmias (irregular heartbeats). During catheter ablation, a long, thin, flexible tube is put into a blood vessel in your groin (upper thigh), or neck. This tube is called an ablation catheter. It is then guided to your heart through the blood vessel. Radio frequency waves destroy small areas of heart tissue where abnormal heartbeats may cause an arrhythmia to start. Please see the instruction sheet given to you today.  Your physician has requested that you have an echocardiogram. Echocardiography is a painless test that uses sound waves to create images of your heart. It provides your doctor with information about the size and shape of your heart and how well your heart's chambers and valves are working. This procedure takes approximately one hour. There are no restrictions for this procedure.  Please schedule for ECHO  Follow-Up:  The following dates are available for your ablation:  January 24, 28 February 1, 7, 11, 14, 18, 28   Cardiac Ablation Cardiac ablation is a procedure to disable (ablate) a small amount of heart tissue in very specific places. The heart has many electrical connections. Sometimes these connections are abnormal  and can cause the heart to beat very fast or irregularly. Ablating some of the problem areas can improve the heart rhythm or return it to normal. Ablation may be done for people who:  Have Wolff-Parkinson-White syndrome.  Have fast heart rhythms (tachycardia).  Have taken medicines for an abnormal heart rhythm (arrhythmia) that were not effective or caused side effects.  Have a high-risk heartbeat that may be life-threatening. During the procedure, a small incision is made in the neck or the groin, and a long, thin, flexible tube (catheter) is inserted into the incision and moved to the heart. Small devices (electrodes) on the tip of the catheter will send out electrical currents. A type of X-ray (fluoroscopy) will be used to help guide the catheter and to provide images of the heart. Tell a health care provider about:  Any allergies you have.  All medicines you are taking, including vitamins, herbs, eye drops, creams, and over-the-counter medicines.  Any problems you or family members have had with anesthetic medicines.  Any blood disorders you have.  Any surgeries you have had.  Any medical conditions you have, such as kidney failure.  Whether you are pregnant or may be pregnant. What are the risks? Generally, this is a safe procedure. However, problems may occur, including:  Infection.  Bruising and bleeding at the catheter insertion site.  Bleeding into the chest, especially into the sac that surrounds the heart. This is a serious complication.  Stroke or blood clots.  Damage to other structures or organs.  Allergic reaction to medicines or dyes.  Need for a permanent pacemaker if the normal electrical  system is damaged. A pacemaker is a small computer that sends electrical signals to the heart and helps your heart beat normally.  The procedure not being fully effective. This may not be recognized until months later. Repeat ablation procedures are sometimes  required. What happens before the procedure?  Follow instructions from your health care provider about eating or drinking restrictions.  Ask your health care provider about: ? Changing or stopping your regular medicines. This is especially important if you are taking diabetes medicines or blood thinners. ? Taking medicines such as aspirin and ibuprofen. These medicines can thin your blood. Do not take these medicines before your procedure if your health care provider instructs you not to.  Plan to have someone take you home from the hospital or clinic.  If you will be going home right after the procedure, plan to have someone with you for 24 hours. What happens during the procedure?  To lower your risk of infection: ? Your health care team will wash or sanitize their hands. ? Your skin will be washed with soap. ? Hair may be removed from the incision area.  An IV tube will be inserted into one of your veins.  You will be given a medicine to help you relax (sedative).  The skin on your neck or groin will be numbed.  An incision will be made in your neck or your groin.  A needle will be inserted through the incision and into a large vein in your neck or groin.  A catheter will be inserted into the needle and moved to your heart.  Dye may be injected through the catheter to help your surgeon see the area of the heart that needs treatment.  Electrical currents will be sent from the catheter to ablate heart tissue in desired areas. There are three types of energy that may be used to ablate heart tissue: ? Heat (radiofrequency energy). ? Laser energy. ? Extreme cold (cryoablation).  When the necessary tissue has been ablated, the catheter will be removed.  Pressure will be held on the catheter insertion area to prevent excessive bleeding.  A bandage (dressing) will be placed over the catheter insertion area. The procedure may vary among health care providers and hospitals. What  happens after the procedure?  Your blood pressure, heart rate, breathing rate, and blood oxygen level will be monitored until the medicines you were given have worn off.  Your catheter insertion area will be monitored for bleeding. You will need to lie still for a few hours to ensure that you do not bleed from the catheter insertion area.  Do not drive for 24 hours or as long as directed by your health care provider. Summary  Cardiac ablation is a procedure to disable (ablate) a small amount of heart tissue in very specific places. Ablating some of the problem areas can improve the heart rhythm or return it to normal.  During the procedure, electrical currents will be sent from the catheter to ablate heart tissue in desired areas. This information is not intended to replace advice given to you by your health care provider. Make sure you discuss any questions you have with your health care provider. Document Revised: 03/10/2018 Document Reviewed: 08/06/2016 Elsevier Patient Education  2020 [employer].

## 2020-09-08 ENCOUNTER — Telehealth: Payer: Self-pay | Admitting: Cardiology

## 2020-09-08 DIAGNOSIS — I471 Supraventricular tachycardia: Secondary | ICD-10-CM

## 2020-09-08 NOTE — Telephone Encounter (Signed)
Patient is calling to schedule her ablation. Also mentioned she needed blood work. Please call to discuss.

## 2020-09-09 NOTE — Telephone Encounter (Signed)
Message left for Pt to return this nurse's call to schedule ablation.

## 2020-09-12 NOTE — Telephone Encounter (Signed)
Call placed to pt.  Pt scheduled for SVT ablation on January 24  Will get labs/covid test on January 21  Completed instruction letter and mailed per Pt request  Work up complete

## 2020-09-29 ENCOUNTER — Encounter: Payer: Self-pay | Admitting: Internal Medicine

## 2020-09-29 ENCOUNTER — Ambulatory Visit (INDEPENDENT_AMBULATORY_CARE_PROVIDER_SITE_OTHER): Payer: BC Managed Care – PPO | Admitting: Internal Medicine

## 2020-09-29 ENCOUNTER — Other Ambulatory Visit: Payer: Self-pay

## 2020-09-29 VITALS — BP 120/74 | HR 61 | Temp 97.6°F | Ht 65.0 in | Wt 161.0 lb

## 2020-09-29 DIAGNOSIS — I471 Supraventricular tachycardia: Secondary | ICD-10-CM

## 2020-09-29 DIAGNOSIS — Z0000 Encounter for general adult medical examination without abnormal findings: Secondary | ICD-10-CM | POA: Diagnosis not present

## 2020-09-29 DIAGNOSIS — E7800 Pure hypercholesterolemia, unspecified: Secondary | ICD-10-CM | POA: Diagnosis not present

## 2020-09-29 DIAGNOSIS — Z23 Encounter for immunization: Secondary | ICD-10-CM | POA: Diagnosis not present

## 2020-09-29 DIAGNOSIS — N6092 Unspecified benign mammary dysplasia of left breast: Secondary | ICD-10-CM

## 2020-09-29 NOTE — Progress Notes (Signed)
Date:  09/29/2020   Name:  Tammy Acevedo   DOB:  1971-03-08   MRN:  [id number]   Chief Complaint: Annual Exam (Breast exam no pap) and Flu Vaccine  Tammy Acevedo is a 49 y.o. female who presents today for her Complete Annual Exam. She feels well. She reports exercising none. She reports she is sleeping fairly well. Breast complaints none.  Mammogram: 08/2020 DEXA: none Pap smear: 06/2020 neg with co-testing at [facility] Colonoscopy: 11/2019  Immunization History  Administered Date(s) Administered  . Influenza,inj,Quad PF,6+ Mos 09/25/2017, 09/26/2018, 09/29/2019  . Moderna Sars-Covid-2 Vaccination 11/04/2019, 12/03/2019  . Tdap 12/22/2012    Palpitations  This is a recurrent problem. The problem occurs intermittently (more often recently). Associated symptoms include diaphoresis (hot flashes ). Pertinent negatives include no anxiety, chest pain, coughing, dizziness, fever, shortness of breath or vomiting. She has tried beta blockers for the symptoms. The treatment provided moderate (planning ablation for next month) relief.    Lab Results  Component Value Date   CREATININE 0.84 07/20/2020   BUN 9 07/20/2020   NA 142 07/20/2020   K 3.9 07/20/2020   CL 103 07/20/2020   CO2 27 07/20/2020   Lab Results  Component Value Date   CHOL 150 09/29/2019   HDL 47 09/29/2019   LDLCALC 87 09/29/2019   TRIG 86 09/29/2019   CHOLHDL 3.2 09/29/2019   Lab Results  Component Value Date   TSH 1.310 09/29/2019   No results found for: HGBA1C Lab Results  Component Value Date   WBC 14.7 (H) 07/15/2020   HGB 13.3 07/15/2020   HCT 39.1 07/15/2020   MCV 85.4 07/15/2020   PLT 293 07/15/2020   Lab Results  Component Value Date   ALT 11 09/29/2019   AST 15 09/29/2019   ALKPHOS 78 09/29/2019   BILITOT 0.5 09/29/2019     Review of Systems  Constitutional: Positive for diaphoresis (hot flashes ). Negative for chills, fatigue and fever.  HENT: Negative for congestion,  hearing loss, tinnitus, trouble swallowing and voice change.   Eyes: Negative for visual disturbance.  Respiratory: Negative for cough, chest tightness, shortness of breath and wheezing.   Cardiovascular: Positive for palpitations. Negative for chest pain and leg swelling.  Gastrointestinal: Negative for abdominal pain, constipation, diarrhea and vomiting.  Endocrine: Negative for polydipsia and polyuria.  Genitourinary: Negative for dysuria, frequency, genital sores, vaginal bleeding and vaginal discharge.  Musculoskeletal: Negative for arthralgias, gait problem and joint swelling.  Skin: Negative for color change and rash.  Neurological: Negative for dizziness, tremors, light-headedness and headaches.  Hematological: Negative for adenopathy. Does not bruise/bleed easily.  Psychiatric/Behavioral: Positive for sleep disturbance. Negative for dysphoric mood. The patient is not nervous/anxious.     Patient Active Problem List   Diagnosis Date Noted  . Genetic testing 04/06/2020  . Family history of breast cancer   . Family history of throat cancer   . Special screening for malignant neoplasms, colon   . Family history of breast cancer in female 10/15/2019  . 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 Transaminitis 12/31/2018  . Gastroesophageal reflux disease without esophagitis 12/17/2017  . Anxiety 12/17/2017  . SVT (supraventricular tachycardia) (HCC) 04/04/2017  . Bilateral carpal tunnel syndrome 03/20/2012    No Known Allergies  Past Surgical History:  Procedure Laterality Date  . BREAST BIOPSY Left 08/11/2019   distortion, coil marker, radial scar and ADH  . BREAST BIOPSY Left 10/07/2019   Procedure: BREAST BIOPSY WITH  NEEDLE LOCALIZATION;  Surgeon: Campbell Lerner, MD;  Location: [facility] ORS;  Service: General;  Laterality: Left;  . BREAST LUMPECTOMY Left 10/07/2019   radial scar, ADH, dense stromal fibrosis excisied, clear surgical margins  . CARPAL TUNNEL  RELEASE    . CATARACT EXTRACTION W/PHACO Right 07/13/2020   Procedure: CATARACT EXTRACTION PHACO AND INTRAOCULAR LENS PLACEMENT (IOC) RIGHT;  Surgeon: Lockie Mola, MD;  Location: [facility];  Service: Ophthalmology;  Laterality: Right;  7.53 1:21.3 9.3%  . CATARACT EXTRACTION W/PHACO Left 08/03/2020   Procedure: CATARACT EXTRACTION PHACO AND INTRAOCULAR LENS PLACEMENT (IOC) LEFT 4.52 00:44.3 10.3%;  Surgeon: Lockie Mola, MD;  Location: [facility];  Service: Ophthalmology;  Laterality: Left;  . CHOLECYSTECTOMY    . COLONOSCOPY WITH PROPOFOL N/A 12/07/2019   Procedure: COLONOSCOPY WITH PROPOFOL;  Surgeon: Midge Minium, MD;  Location: [facility];  Service: Endoscopy;  Laterality: N/A;  . TUBAL LIGATION      Social History   Tobacco Use  . Smoking status: Never Smoker  . Smokeless tobacco: Never Used  Vaping Use  . Vaping Use: Never used  Substance Use Topics  . Alcohol use: No    Alcohol/week: 0.0 standard drinks  . Drug use: No     Medication list has been reviewed and updated.  Current Meds  Medication Sig  . acetaminophen (TYLENOL) 500 MG tablet Take 1,000 mg by mouth every 6 (six) hours as needed for moderate pain.   . APPLE CIDER VINEGAR PO Take by mouth daily.  Marland Kitchen ELDERBERRY PO Take by mouth daily.  . Loratadine 10 MG CAPS Take by mouth daily.  . metoprolol tartrate (LOPRESSOR) 25 MG tablet Take 1 tablet by mouth twice daily  . tamoxifen (NOLVADEX) 20 MG tablet Take 1 tablet by mouth once daily    PHQ 2/9 Scores 09/29/2020 09/29/2019 09/26/2018 09/25/2017  PHQ - 2 Score 0 0 0 0  PHQ- 9 Score 0 0 - -    GAD 7 : Generalized Anxiety Score 09/29/2020 09/29/2019  Nervous, Anxious, on Edge 0 0  Control/stop worrying 0 0  Worry too much - different things 0 0  Trouble relaxing 0 0  Restless 0 0  Easily annoyed or irritable 0 0  Afraid - awful might happen 0 0  Total GAD 7 Score 0 0    BP Readings from Last 3 Encounters:   09/29/20 120/74  09/07/20 110/70  08/03/20 110/78    Physical Exam Vitals and nursing note reviewed.  Constitutional:      General: She is not in acute distress.    Appearance: She is well-developed.  HENT:     Head: Normocephalic and atraumatic.     Right Ear: Tympanic membrane and ear canal normal.     Left Ear: Tympanic membrane and ear canal normal.     Nose:     Right Sinus: No maxillary sinus tenderness.     Left Sinus: No maxillary sinus tenderness.  Eyes:     General: No scleral icterus.       Right eye: No discharge.        Left eye: No discharge.     Conjunctiva/sclera: Conjunctivae normal.  Neck:     Thyroid: No thyromegaly.     Vascular: No carotid bruit.  Cardiovascular:     Rate and Rhythm: Normal rate and regular rhythm.     Pulses: Normal pulses.     Heart sounds: Normal heart sounds.  Pulmonary:     Effort: Pulmonary effort  is normal. No respiratory distress.     Breath sounds: No wheezing.  Chest:  Breasts:     Right: No mass, nipple discharge, skin change or tenderness.     Left: No mass, nipple discharge, skin change or tenderness.    Abdominal:     General: Bowel sounds are normal.     Palpations: Abdomen is soft.     Tenderness: There is no abdominal tenderness.  Musculoskeletal:     Cervical back: Normal range of motion. No erythema.     Right lower leg: No edema.     Left lower leg: No edema.  Lymphadenopathy:     Cervical: No cervical adenopathy.  Skin:    General: Skin is warm and dry.     Capillary Refill: Capillary refill takes less than 2 seconds.     Findings: No rash.  Neurological:     General: No focal deficit present.     Mental Status: She is alert and oriented to person, place, and time.     Cranial Nerves: No cranial nerve deficit.     Sensory: No sensory deficit.     Deep Tendon Reflexes: Reflexes are normal and symmetric.  Psychiatric:        Attention and Perception: Attention normal.        Mood and Affect: Mood  normal.     Wt Readings from Last 3 Encounters:  09/29/20 161 lb (73 kg)  09/07/20 164 lb 9.6 oz (74.7 kg)  08/03/20 160 lb 15 oz (73 kg)    BP 120/74   Pulse 61   Temp 97.6 F (36.4 C) (Oral)   Ht 5\' 5"  (1.651 m)   Wt 161 lb (73 kg)   SpO2 97%   BMI 26.79 kg/m   Assessment and Plan: 1. Annual physical exam Normal exam Continue healthy diet, exercise regularly is recommended - Lipid panel  2. SVT (supraventricular tachycardia) (HCC) Planning ablation in one month Anticipate stopped metoprolol afterwards if sucessful - CBC with Differential/Platelet - Comprehensive metabolic panel - TSH  3. Atypical ductal hyperplasia of left breast S/p biopsy Recent mammogram normal Continue Tamoxifen - tolerated with moderate hot flashes that are manageable   Partially dictated using . Any errors are unintentional.  Animal nutritionist, MD [facility]  09/29/2020

## 2020-09-30 LAB — LIPID PANEL
Chol/HDL Ratio: 3.4 ratio (ref 0.0–4.4)
Cholesterol, Total: 150 mg/dL (ref 100–199)
HDL: 44 mg/dL (ref >39)
LDL Chol Calc (NIH): 85 mg/dL (ref 0–99)
Triglycerides: 116 mg/dL (ref 0–149)
VLDL Cholesterol Cal: 21 mg/dL (ref 5–40)

## 2020-09-30 LAB — COMPREHENSIVE METABOLIC PANEL
ALT: 16 IU/L (ref 0–32)
AST: 19 IU/L (ref 0–40)
Albumin/Globulin Ratio: 1.8 (ref 1.2–2.2)
Albumin: 4.1 g/dL (ref 3.8–4.9)
Alkaline Phosphatase: 72 IU/L (ref 44–121)
BUN/Creatinine Ratio: 10 (ref 9–23)
BUN: 9 mg/dL (ref 6–24)
Bilirubin Total: 0.3 mg/dL (ref 0.0–1.2)
CO2: 24 mmol/L (ref 20–29)
Calcium: 8.8 mg/dL (ref 8.7–10.2)
Chloride: 104 mmol/L (ref 96–106)
Creatinine, Ser: 0.86 mg/dL (ref 0.57–1.00)
GFR calc Af Amer: 90 mL/min/{1.73_m2} (ref >59)
GFR calc non Af Amer: 78 mL/min/{1.73_m2} (ref >59)
Globulin, Total: 2.3 g/dL (ref 1.5–4.5)
Glucose: 89 mg/dL (ref 65–99)
Potassium: 4.7 mmol/L (ref 3.5–5.2)
Sodium: 141 mmol/L (ref 134–144)
Total Protein: 6.4 g/dL (ref 6.0–8.5)

## 2020-09-30 LAB — CBC WITH DIFFERENTIAL/PLATELET
Basophils Absolute: 0 10*3/uL (ref 0.0–0.2)
Basos: 0 %
EOS (ABSOLUTE): 0.1 10*3/uL (ref 0.0–0.4)
Eos: 1 %
Hematocrit: 39 % (ref 34.0–46.6)
Hemoglobin: 13.7 g/dL (ref 11.1–15.9)
Immature Grans (Abs): 0 10*3/uL (ref 0.0–0.1)
Immature Granulocytes: 0 %
Lymphocytes Absolute: 1.8 10*3/uL (ref 0.7–3.1)
Lymphs: 26 %
MCH: 30.6 pg (ref 26.6–33.0)
MCHC: 35.1 g/dL (ref 31.5–35.7)
MCV: 87 fL (ref 79–97)
Monocytes Absolute: 0.4 10*3/uL (ref 0.1–0.9)
Monocytes: 6 %
Neutrophils Absolute: 4.8 10*3/uL (ref 1.4–7.0)
Neutrophils: 67 %
Platelets: 252 10*3/uL (ref 150–450)
RBC: 4.48 x10E6/uL (ref 3.77–5.28)
RDW: 12.1 % (ref 11.7–15.4)
WBC: 7.1 10*3/uL (ref 3.4–10.8)

## 2020-09-30 LAB — TSH: TSH: 1.77 u[IU]/mL (ref 0.450–4.500)

## 2020-10-01 ENCOUNTER — Other Ambulatory Visit: Payer: Self-pay | Admitting: Oncology

## 2020-10-12 ENCOUNTER — Ambulatory Visit (INDEPENDENT_AMBULATORY_CARE_PROVIDER_SITE_OTHER): Payer: BC Managed Care – PPO

## 2020-10-12 ENCOUNTER — Other Ambulatory Visit: Payer: Self-pay

## 2020-10-12 DIAGNOSIS — I10 Essential (primary) hypertension: Secondary | ICD-10-CM | POA: Diagnosis not present

## 2020-10-12 DIAGNOSIS — I471 Supraventricular tachycardia: Secondary | ICD-10-CM | POA: Diagnosis not present

## 2020-10-12 LAB — ECHOCARDIOGRAM COMPLETE
AR max vel: 2.27 cm2
AV Area VTI: 2.13 cm2
AV Area mean vel: 2.03 cm2
AV Mean grad: 5 mmHg
AV Peak grad: 9.9 mmHg
Ao pk vel: 1.57 m/s
Area-P 1/2: 4.68 cm2
Calc EF: 60.3 %
S' Lateral: 3.2 cm
Single Plane A2C EF: 58.7 %
Single Plane A4C EF: 60.8 %

## 2020-10-21 ENCOUNTER — Other Ambulatory Visit
Admission: RE | Admit: 2020-10-21 | Discharge: 2020-10-21 | Disposition: A | Discharge status: Home / Self Care | Payer: BC Managed Care – PPO | Source: Ambulatory Visit | Attending: Cardiology | Admitting: Cardiology

## 2020-10-21 ENCOUNTER — Other Ambulatory Visit
Admission: RE | Admit: 2020-10-21 | Discharge: 2020-10-21 | Disposition: A | Discharge status: Home / Self Care | Payer: BC Managed Care – PPO | Attending: Cardiology | Admitting: Cardiology

## 2020-10-21 ENCOUNTER — Other Ambulatory Visit: Payer: Self-pay

## 2020-10-21 DIAGNOSIS — Z20822 Contact with and (suspected) exposure to covid-19: Secondary | ICD-10-CM | POA: Insufficient documentation

## 2020-10-21 DIAGNOSIS — I471 Supraventricular tachycardia: Secondary | ICD-10-CM | POA: Diagnosis not present

## 2020-10-21 DIAGNOSIS — Z01812 Encounter for preprocedural laboratory examination: Secondary | ICD-10-CM | POA: Diagnosis not present

## 2020-10-21 LAB — CBC WITH DIFFERENTIAL/PLATELET
Abs Immature Granulocytes: 0.01 10*3/uL (ref 0.00–0.07)
Basophils Absolute: 0 10*3/uL (ref 0.0–0.1)
Basophils Relative: 0 %
Eosinophils Absolute: 0.1 10*3/uL (ref 0.0–0.5)
Eosinophils Relative: 1 %
HCT: 38.2 % (ref 36.0–46.0)
Hemoglobin: 12.8 g/dL (ref 12.0–15.0)
Immature Granulocytes: 0 %
Lymphocytes Relative: 42 %
Lymphs Abs: 2.4 10*3/uL (ref 0.7–4.0)
MCH: 29.3 pg (ref 26.0–34.0)
MCHC: 33.5 g/dL (ref 30.0–36.0)
MCV: 87.4 fL (ref 80.0–100.0)
Monocytes Absolute: 0.4 10*3/uL (ref 0.1–1.0)
Monocytes Relative: 6 %
Neutro Abs: 2.8 10*3/uL (ref 1.7–7.7)
Neutrophils Relative %: 51 %
Platelets: 208 10*3/uL (ref 150–400)
RBC: 4.37 MIL/uL (ref 3.87–5.11)
RDW: 12.2 % (ref 11.5–15.5)
WBC: 5.6 10*3/uL (ref 4.0–10.5)
nRBC: 0 % (ref 0.0–0.2)

## 2020-10-21 LAB — BASIC METABOLIC PANEL
Anion gap: 11 (ref 5–15)
BUN: 7 mg/dL (ref 6–20)
CO2: 26 mmol/L (ref 22–32)
Calcium: 8.8 mg/dL — ABNORMAL LOW (ref 8.9–10.3)
Chloride: 104 mmol/L (ref 98–111)
Creatinine, Ser: 0.87 mg/dL (ref 0.44–1.00)
GFR, Estimated: >60 mL/min (ref >60)
Glucose, Bld: 120 mg/dL — ABNORMAL HIGH (ref 70–99)
Potassium: 3.7 mmol/L (ref 3.5–5.1)
Sodium: 141 mmol/L (ref 135–145)

## 2020-10-21 NOTE — Progress Notes (Signed)
Instructed patient on the following items: Arrival time 0830 Nothing to eat or drink after midnight No meds AM of procedure Responsible person to drive you home and stay with you for 24 hrs  Was to stop taking Metorprol 1/18.

## 2020-10-22 LAB — SARS CORONAVIRUS 2 (TAT 6-24 HRS): SARS Coronavirus 2: NEGATIVE

## 2020-10-23 NOTE — Anesthesia Preprocedure Evaluation (Addendum)
Anesthesia Evaluation  Patient identified by MRN, date of birth, ID band Patient awake    Reviewed: Allergy & Precautions, NPO status , Patient's Chart, lab work & pertinent test results  History of Anesthesia Complications Negative for: history of anesthetic complications  Airway Mallampati: II  TM Distance: >3 FB Neck ROM: Full    Dental  (+) Dental Advisory Given, Partial Upper   Pulmonary neg pulmonary ROS,    Pulmonary exam normal        Cardiovascular hypertension, Pt. on home beta blockers and Pt. on medications Normal cardiovascular exam+ dysrhythmias Supra Ventricular Tachycardia      Neuro/Psych PSYCHIATRIC DISORDERS Anxiety negative neurological ROS     GI/Hepatic Neg liver ROS, GERD  Controlled,  Endo/Other  negative endocrine ROS  Renal/GU negative Renal ROS     Musculoskeletal negative musculoskeletal ROS (+)   Abdominal   Peds  Hematology negative hematology ROS (+)   Anesthesia Other Findings Covid 08/2019 Covid test negative   Reproductive/Obstetrics                            Anesthesia Physical Anesthesia Plan  ASA: II  Anesthesia Plan: MAC   Post-op Pain Management:    Induction: Intravenous  PONV Risk Score and Plan: 2 and Propofol infusion and Treatment may vary due to age or medical condition  Airway Management Planned: Natural Airway and Simple Face Mask  Additional Equipment: None  Intra-op Plan:   Post-operative Plan:   Informed Consent: I have reviewed the patients History and Physical, chart, labs and discussed the procedure including the risks, benefits and alternatives for the proposed anesthesia with the patient or authorized representative who has indicated his/her understanding and acceptance.       Plan Discussed with: CRNA, Anesthesiologist and Surgeon  Anesthesia Plan Comments:        Anesthesia Quick Evaluation

## 2020-10-24 ENCOUNTER — Ambulatory Visit: Payer: BC Managed Care – PPO | Admitting: Oncology

## 2020-10-24 ENCOUNTER — Other Ambulatory Visit: Payer: BC Managed Care – PPO

## 2020-10-24 ENCOUNTER — Ambulatory Visit (HOSPITAL_COMMUNITY): Payer: BC Managed Care – PPO | Admitting: Anesthesiology

## 2020-10-24 ENCOUNTER — Encounter (HOSPITAL_COMMUNITY)
Admission: RE | Disposition: A | Discharge status: Home / Self Care | Payer: BC Managed Care – PPO | Source: Home / Self Care | Attending: Cardiology

## 2020-10-24 ENCOUNTER — Ambulatory Visit (HOSPITAL_COMMUNITY)
Admission: RE | Admit: 2020-10-24 | Discharge: 2020-10-24 | Disposition: A | Discharge status: Home / Self Care | Payer: BC Managed Care – PPO | Attending: Cardiology | Admitting: Cardiology

## 2020-10-24 ENCOUNTER — Other Ambulatory Visit: Payer: Self-pay

## 2020-10-24 ENCOUNTER — Encounter (HOSPITAL_COMMUNITY): Payer: Self-pay | Admitting: Cardiology

## 2020-10-24 DIAGNOSIS — F419 Anxiety disorder, unspecified: Secondary | ICD-10-CM | POA: Diagnosis not present

## 2020-10-24 DIAGNOSIS — I471 Supraventricular tachycardia: Secondary | ICD-10-CM | POA: Diagnosis not present

## 2020-10-24 DIAGNOSIS — I10 Essential (primary) hypertension: Secondary | ICD-10-CM | POA: Diagnosis not present

## 2020-10-24 DIAGNOSIS — Z79899 Other long term (current) drug therapy: Secondary | ICD-10-CM | POA: Insufficient documentation

## 2020-10-24 DIAGNOSIS — K219 Gastro-esophageal reflux disease without esophagitis: Secondary | ICD-10-CM | POA: Diagnosis not present

## 2020-10-24 HISTORY — PX: SVT ABLATION: EP1225

## 2020-10-24 SURGERY — SVT ABLATION
Anesthesia: Monitor Anesthesia Care

## 2020-10-24 MED ORDER — MIDAZOLAM HCL 2 MG/2ML IJ SOLN
INTRAMUSCULAR | Status: DC | PRN
  Administered 2020-10-24: 2 mg via INTRAVENOUS

## 2020-10-24 MED ORDER — ISOPROTERENOL HCL 0.2 MG/ML IJ SOLN
INTRAVENOUS | Status: DC | PRN
  Administered 2020-10-24: 2 ug/min via INTRAVENOUS

## 2020-10-24 MED ORDER — ONDANSETRON HCL 4 MG/2ML IJ SOLN
INTRAMUSCULAR | Status: DC | PRN
  Administered 2020-10-24: 4 mg via INTRAVENOUS

## 2020-10-24 MED ORDER — SODIUM CHLORIDE 0.9% FLUSH: 3.0000 mL | Freq: Two times a day (BID) | INTRAVENOUS | Status: DC

## 2020-10-24 MED ORDER — BUPIVACAINE HCL (PF) 0.25 % IJ SOLN
INTRAMUSCULAR | Status: DC | PRN
  Administered 2020-10-24: 30 mL

## 2020-10-24 MED ORDER — ONDANSETRON HCL 4 MG/2ML IJ SOLN: 4.0000 mg | Freq: Four times a day (QID) | INTRAMUSCULAR | Status: DC | PRN

## 2020-10-24 MED ORDER — PROPOFOL 500 MG/50ML IV EMUL
INTRAVENOUS | Status: DC | PRN
  Administered 2020-10-24: 60 ug/kg/min via INTRAVENOUS

## 2020-10-24 MED ORDER — HEPARIN (PORCINE) IN NACL 1000-0.9 UT/500ML-% IV SOLN
INTRAVENOUS | Status: DC | PRN
  Administered 2020-10-24 (×2): 500 mL

## 2020-10-24 MED ORDER — EPHEDRINE SULFATE-NACL 50-0.9 MG/10ML-% IV SOSY
PREFILLED_SYRINGE | INTRAVENOUS | Status: DC | PRN
  Administered 2020-10-24: 5 mg via INTRAVENOUS
  Administered 2020-10-24: 10 mg via INTRAVENOUS

## 2020-10-24 MED ORDER — HEPARIN (PORCINE) IN NACL 1000-0.9 UT/500ML-% IV SOLN
INTRAVENOUS | Status: AC
  Filled 2020-10-24: qty 1000

## 2020-10-24 MED ORDER — FENTANYL CITRATE (PF) 100 MCG/2ML IJ SOLN
INTRAMUSCULAR | Status: DC | PRN
  Administered 2020-10-24: 50 ug via INTRAVENOUS

## 2020-10-24 MED ORDER — SODIUM CHLORIDE 0.9% FLUSH: 3.0000 mL | INTRAVENOUS | Status: DC | PRN

## 2020-10-24 MED ORDER — BUPIVACAINE HCL (PF) 0.25 % IJ SOLN
INTRAMUSCULAR | Status: AC
  Filled 2020-10-24: qty 30

## 2020-10-24 MED ORDER — LIDOCAINE 2% (20 MG/ML) 5 ML SYRINGE
INTRAMUSCULAR | Status: DC | PRN
  Administered 2020-10-24: 20 mg via INTRAVENOUS

## 2020-10-24 MED ORDER — SODIUM CHLORIDE 0.9 % IV SOLN: INTRAVENOUS | Status: DC

## 2020-10-24 MED ORDER — ACETAMINOPHEN 325 MG PO TABS
650.0000 mg | ORAL_TABLET | ORAL | Status: DC | PRN
  Filled 2020-10-24: qty 2

## 2020-10-24 MED ORDER — PROPOFOL 10 MG/ML IV BOLUS
INTRAVENOUS | Status: DC | PRN
  Administered 2020-10-24: 15 mg via INTRAVENOUS
  Administered 2020-10-24 (×2): 20 mg via INTRAVENOUS
  Administered 2020-10-24: 15 mg via INTRAVENOUS

## 2020-10-24 MED ORDER — SODIUM CHLORIDE 0.9 % IV SOLN: 250.0000 mL | INTRAVENOUS | Status: DC | PRN

## 2020-10-24 MED ORDER — ISOPROTERENOL HCL 0.2 MG/ML IJ SOLN
INTRAMUSCULAR | Status: AC
  Filled 2020-10-24: qty 5

## 2020-10-24 MED ORDER — HEPARIN SODIUM (PORCINE) 1000 UNIT/ML IJ SOLN
INTRAMUSCULAR | Status: AC
  Filled 2020-10-24: qty 1

## 2020-10-24 MED ORDER — HEPARIN SODIUM (PORCINE) 1000 UNIT/ML IJ SOLN
INTRAMUSCULAR | Status: DC | PRN
  Administered 2020-10-24: 1000 [IU] via INTRAVENOUS

## 2020-10-24 SURGICAL SUPPLY — 14 items
BLANKET WARM UNDERBOD FULL ACC (MISCELLANEOUS) ×2 IMPLANT
CATH CRD2 QUAD 6FR 5 (CATHETERS) ×2 IMPLANT
CATH DECANAV F CURVE (CATHETERS) ×2 IMPLANT
CATH JOSEPH QUAD ALLRED 6F REP (CATHETERS) ×2 IMPLANT
CATH SMTCH THERMOCOOL SF DF (CATHETERS) ×2 IMPLANT
CLOSURE PERCLOSE PROSTYLE (VASCULAR PRODUCTS) ×6 IMPLANT
PACK EP LATEX FREE (CUSTOM PROCEDURE TRAY) ×2
PACK EP LF (CUSTOM PROCEDURE TRAY) ×1 IMPLANT
PAD PRO RADIOLUCENT 2001M-C (PAD) ×2 IMPLANT
PATCH CARTO3 (PAD) ×2 IMPLANT
SHEATH PINNACLE 7F 10CM (SHEATH) ×2 IMPLANT
SHEATH PINNACLE 8F 10CM (SHEATH) ×4 IMPLANT
SHEATH PROBE COVER 6X72 (BAG) ×2 IMPLANT
TUBING SMART ABLATE COOLFLOW (TUBING) ×2 IMPLANT

## 2020-10-24 NOTE — Progress Notes (Signed)
Dr Lambert in and ok to dc home 

## 2020-10-24 NOTE — Anesthesia Postprocedure Evaluation (Signed)
Anesthesia Post Note  Patient: Toriann Spadoni  Procedure(s) Performed: SVT ABLATION (N/A )     Patient location during evaluation: PACU Anesthesia Type: MAC Level of consciousness: awake and alert Pain management: pain level controlled Vital Signs Assessment: post-procedure vital signs reviewed and stable Respiratory status: spontaneous breathing, nonlabored ventilation and respiratory function stable Cardiovascular status: stable and blood pressure returned to baseline Anesthetic complications: no   No complications documented.  Last Vitals:  Vitals:   10/24/20 0940 10/24/20 0945  BP: 111/66 109/68  Pulse: 62 71  Resp: 16 (!) 21  Temp:  (!) 36.2 C  SpO2: 99% 97%    Last Pain:  Vitals:   10/24/20 0945  TempSrc: Temporal  PainSc:                  Audry Pili

## 2020-10-24 NOTE — Anesthesia Procedure Notes (Signed)
Procedure Name: MAC Date/Time: 10/24/2020 8:38 AM Performed by: Janace Litten, CRNA Pre-anesthesia Checklist: Patient identified, Emergency Drugs available, Suction available and Patient being monitored Patient Re-evaluated:Patient Re-evaluated prior to induction Oxygen Delivery Method: Simple face mask

## 2020-10-24 NOTE — Progress Notes (Signed)
Client up and walked and tolerated well; right groin stable, no bleeding or hematoma 

## 2020-10-24 NOTE — H&P (Signed)
Electrophysiology Office Note:    Date:  09/07/2020   ID:  Tammy Acevedo, DOB 06-26-1971, MRN [id number]  PCP:  Reubin Milan, MD        [facility] Cardiologist:  Julien Nordmann, MD  [facility] Electrophysiologist:  Lanier Prude, MD   Referring MD: Reubin Milan, MD   Chief Complaint: SVT  History of Present Illness:    Tammy Acevedo is a 50 y.o. female who presents for an evaluation of SVT at the request of Dr. Judithann Graves and Ward Givens, PA-C. Their medical history includes mild mitral vegetation, hypertension, COVID-19.  She has a long history of SVT and was previously seen by the [facility].  She has been maintained on beta-blockers which seemed to keep her symptoms controlled.  She last saw Ward Givens on July 20, 2020.  This was shortly after presentation to the emergency department for an episode of SVT with a heart rate of 187 bpm that occurred while she was cleaning the house.  Ultimately, the tachycardia resolved with vagal maneuvers.  No clear triggers for the episode that day.  Today she tells me that the episodes of tachycardia seem to be occurring more frequently.  Thankfully they have not lasted long enough to require presentation to the emergency department.  She feels like she has brief flutters at work that occur without clear triggers.  They last seconds at a time.  After the more prolonged episodes such as the one recently that led to the emergency department presentation, she is fatigued and "wiped out" for several days.  The last episode occurred despite treatment with metoprolol.  Interestingly, her brother also has SVT and has required a recent ablation.  She works at a group home.      Past Medical History:  Diagnosis Date  . Chest pain    a. 08/2015 ETT: Ex time 9:01, Max HR 166, No ECG changes->Nl study.  Marland Kitchen COVID-19 08/31/2019  . Family history of breast cancer   . Family history of lung cancer   .  Family history of throat cancer   . Hypertension   . Mild Mitral regurgitation    a. 08/2015 Echo: EF 50%, no rwma, Triv TR/PR, mild MR.  . SVT (supraventricular tachycardia) (HCC)   . Wears dentures    partial upper         Past Surgical History:  Procedure Laterality Date  . BREAST BIOPSY Left 08/11/2019   distortion, coil marker, radial scar and ADH  . BREAST BIOPSY Left 10/07/2019   Procedure: BREAST BIOPSY WITH NEEDLE LOCALIZATION;  Surgeon: Campbell Lerner, MD;  Location: [facility] ORS;  Service: General;  Laterality: Left;  . BREAST LUMPECTOMY Left 10/07/2019   radial scar, ADH, dense stromal fibrosis excisied, clear surgical margins  . CARPAL TUNNEL RELEASE    . CATARACT EXTRACTION W/PHACO Right 07/13/2020   Procedure: CATARACT EXTRACTION PHACO AND INTRAOCULAR LENS PLACEMENT (IOC) RIGHT;  Surgeon: Lockie Mola, MD;  Location: [facility];  Service: Ophthalmology;  Laterality: Right;  7.53 1:21.3 9.3%  . CATARACT EXTRACTION W/PHACO Left 08/03/2020   Procedure: CATARACT EXTRACTION PHACO AND INTRAOCULAR LENS PLACEMENT (IOC) LEFT 4.52 00:44.3 10.3%;  Surgeon: Lockie Mola, MD;  Location: [facility];  Service: Ophthalmology;  Laterality: Left;  . CHOLECYSTECTOMY    . COLONOSCOPY WITH PROPOFOL N/A 12/07/2019   Procedure: COLONOSCOPY WITH PROPOFOL;  Surgeon: Midge Minium, MD;  Location: [facility];  Service: Endoscopy;  Laterality: N/A;  . TUBAL LIGATION  Current Medications: Active Medications      Current Meds  Medication Sig  . acetaminophen (TYLENOL) 500 MG tablet Take 1,000 mg by mouth every 6 (six) hours as needed for moderate pain.   Marland Kitchen ELDERBERRY PO Take by mouth daily.  . Loratadine 10 MG CAPS Take by mouth daily.  . metoprolol tartrate (LOPRESSOR) 25 MG tablet Take 1 tablet by mouth twice daily  . tamoxifen (NOLVADEX) 20 MG tablet Take 1 tablet by mouth once daily       Allergies:   Patient has no known  allergies.   Social History        Socioeconomic History  . Marital status: Married    Spouse name: Not on file  . Number of children: 2  . Years of education: Not on file  . Highest education level: Not on file  Occupational History  . Not on file  Tobacco Use  . Smoking status: Never Smoker  . Smokeless tobacco: Never Used  Vaping Use  . Vaping Use: Never used  Substance and Sexual Activity  . Alcohol use: No    Alcohol/week: 0.0 standard drinks  . Drug use: No  . Sexual activity: Not Currently  Other Topics Concern  . Not on file  Social History Narrative  . Not on file   Social Determinants of Health      Financial Resource Strain:   . Difficulty of Paying Living Expenses: Not on file  Food Insecurity:   . Worried About Programme researcher, broadcasting/film/video in the Last Year: Not on file  . Ran Out of Food in the Last Year: Not on file  Transportation Needs:   . Lack of Transportation (Medical): Not on file  . Lack of Transportation (Non-Medical): Not on file  Physical Activity:   . Days of Exercise per Week: Not on file  . Minutes of Exercise per Session: Not on file  Stress:   . Feeling of Stress : Not on file  Social Connections:   . Frequency of Communication with Friends and Family: Not on file  . Frequency of Social Gatherings with Friends and Family: Not on file  . Attends Religious Services: Not on file  . Active Member of Clubs or Organizations: Not on file  . Attends Banker Meetings: Not on file  . Marital Status: Not on file     Family History: The patient's family history includes Breast cancer in her cousin; Breast cancer (age of onset: 61) in her maternal aunt; COPD in her father; Cancer in her paternal aunt; Diabetes in her mother; Lung cancer (age of onset: 14) in her father; Throat cancer in her cousin.  ROS:   Please see the history of present illness.    All other systems reviewed and are negative.  EKGs/Labs/Other Studies  Reviewed:    The following studies were reviewed today: Prior hospitalization records, office notes, EKG  EKG:  The ekg ordered today demonstrates sinus rhythm.  Normal intervals.  No evidence of preexcitation.  Ventricular rate of 64 bpm.  July 15, 2020 EKG shows narrow complex tachycardia.  This is a short RP tachycardia with a small P wave, likely superiorly directed, seen at the terminal portion of the QRS in lead V1 in the inferior leads.     Recent Labs: 09/29/2019: ALT 11; TSH 1.310 07/15/2020: Hemoglobin 13.3; Platelets 293 07/20/2020: BUN 9; Creatinine, Ser 0.84; Potassium 3.9; Sodium 142  Recent Lipid Panel Labs (Brief)  Component Value Date/Time   CHOL 150 09/29/2019 1044   TRIG 86 09/29/2019 1044   HDL 47 09/29/2019 1044   CHOLHDL 3.2 09/29/2019 1044   CHOLHDL 4 12/15/2012 0857   VLDL 14.4 12/15/2012 0857   LDLCALC 87 09/29/2019 1044      Physical Exam:    VS:  BP 110/70   Pulse 62   Ht 5\' 5"  (1.651 m)   Wt 164 lb 9.6 oz (74.7 kg)   SpO2 99%   BMI 27.39 kg/m        Wt Readings from Last 3 Encounters:  09/07/20 164 lb 9.6 oz (74.7 kg)  08/03/20 160 lb 15 oz (73 kg)  07/20/20 161 lb (73 kg)     GEN:  Well nourished, well developed in no acute distress HEENT: Normal NECK: No JVD; No carotid bruits LYMPHATICS: No lymphadenopathy CARDIAC: RRR, no murmurs, rubs, gallops RESPIRATORY:  Clear to auscultation without rales, wheezing or rhonchi  ABDOMEN: Soft, non-tender, non-distended MUSCULOSKELETAL:  No edema; No deformity  SKIN: Warm and dry NEUROLOGIC:  Alert and oriented x 3 PSYCHIATRIC:  Normal affect   ASSESSMENT:    1. PSVT (paroxysmal supraventricular tachycardia) (HCC)   2. Essential hypertension    PLAN:    In order of problems listed above:  1. 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  We  discussed management options including continuing conservative management with metoprolol versus ablation for more definitive treatment.  Given the last episode occurred while she was taking metoprolol and her relatively low resting heart rate, she would like to pursue ablation therapy.  Discussed the ablation procedure in detail with the patient during today's visit including the expected recovery time, associated risks and she would like to proceed with scheduling.  We will get an echocardiogram prior to the procedure.  Risk, benefits, and alternatives to EP study and radiofrequency ablation for SVT were also discussed in detail today. These risks include but are not limited to complete heart block, stroke, bleeding, vascular damage, tamponade, perforation, and death. The patient understands these risk and wishes to proceed.  We will therefore proceed with catheter ablation at the next available time.  Carto and ICE are requested for the procedure.  Will also obtain an echo prior to the procedure.     ----------------------------------------------------------------------------------- I have seen, examined the patient, and reviewed the above assessment and plan.    Plan for EPS and possible ablation.  Risk, benefits, and alternatives to EP study and radiofrequency ablation for SVT were also discussed in detail today. These risks include but are not limited to complete heart block, stroke, bleeding, vascular damage, tamponade, perforation, and death. The patient understands these risk and wishes to proceed.    07/22/20, MD 10/24/2020 6:59 AM

## 2020-10-24 NOTE — Transfer of Care (Signed)
Immediate Anesthesia Transfer of Care Note  Patient: Tammy Acevedo  Procedure(s) Performed: SVT ABLATION (N/A )  Patient Location: PACU and Cath Lab  Anesthesia Type:MAC  Level of Consciousness: awake, alert  and patient cooperative  Airway & Oxygen Therapy: Patient Spontanous Breathing  Post-op Assessment: Report given to RN and Post -op Vital signs reviewed and stable  Post vital signs: Reviewed and stable  Last Vitals:  Vitals Value Taken Time  BP 109/69 10/24/20 0914  Temp    Pulse 84 10/24/20 0915  Resp 19 10/24/20 0915  SpO2 99 % 10/24/20 0915  Vitals shown include unvalidated device data.  Last Pain:  Vitals:   10/24/20 0625  TempSrc:   PainSc: 0-No pain         Complications: No complications documented.

## 2020-10-24 NOTE — Discharge Instructions (Signed)
Cardiac Ablation, Care After This sheet gives you information about how to care for yourself after your procedure. Your health care provider may also give you more specific instructions. If you have problems or questions, contact your health care provider. What can I expect after the procedure? After the procedure, it is common to have:  Bruising around the insertion site.  Tenderness around the insertion site.  Skipped heartbeats.  Tiredness (fatigue). Follow these instructions at home: Insertion site care  Follow instructions from your health care provider about how to take care of your insertion site. Make sure you: ? Wash your hands with soap and water for at least 20 seconds before and after you change your bandage (dressing). If soap and water are not available, use hand sanitizer. ? Change your dressing as told by your health care provider. ? Leave stitches (sutures), skin glue, or adhesive strips in place. These skin closures may need to stay in place for up to 2 weeks. If adhesive strip edges start to loosen and curl up, you may trim the loose edges. Do not remove adhesive strips completely unless your health care provider tells you to do that.  Check your insertion site every day for signs of infection. Check for: ? More redness, swelling, or pain. ? Fluid or blood. ? Warmth. ? Pus or a bad smell.  If your insertion site starts to bleed, lie down on your back, apply firm pressure to the area, and contact your health care provider.   Driving  If you were given a sedative during the procedure, it can affect you for several hours. Do not drive or operate machinery until your health care provider says that it is safe.  Ask your health care provider when it is safe for you to drive again after the procedure.   Activity  Avoid activities that take a lot of effort for at least 3 days after your procedure.  Do not lift anything that is heavier than 10 lb (4.5 kg), or the limit  that you are told, until your health care provider says that it is safe.  Return to your normal activities as told by your health care provider. Ask your health care provider what activities are safe for you.   General instructions  Take over-the-counter and prescription medicines only as told by your health care provider.  Do not use any products that contain nicotine or tobacco, such as cigarettes, e-cigarettes, and chewing tobacco. If you need help quitting, ask your health care provider.  Do not take baths, swim, or use a hot tub until your health care provider approves. Ask your health care provider if you may take showers. You may only be allowed to take sponge baths.  Do not drink alcohol for 24 hours after your procedure.  Keep all follow-up visits as told by your health care provider. This is important. Contact a health care provider if you:  Notice these things around the catheter insertion site: ? More redness, swelling, or pain. ? Fluid or blood that stops after applying firm pressure to the area. ? Warmth when you touch the area. ? Pus or a bad smell.  Have a fever.  Are sweating a lot.  Feel nauseous.  Have pain or numbness in the arm or leg closest to your insertion site. Get help right away if:  Your insertion site suddenly swells.  Your insertion site is bleeding and the bleeding does not stop after applying firm pressure to the area.  You   have chest pain or discomfort that spreads to your neck, jaw, or arm.  You have a fast or irregular heartbeat.  You have shortness of breath.  You are dizzy or light-headed and feel the need to lie down. These symptoms may represent a serious problem that is an emergency. Do not wait to see if the symptoms will go away. Get medical help right away. Call your local emergency services (911 in the [place] Do not drive yourself to the hospital. Summary  After the procedure, it is common to have bruising and tenderness at the  insertion site in your groin or your neck.  Check your insertion site every day for more redness, swelling, or pain. These are signs of infection.  Contact a health care provider if you notice any signs of infection. Also, contact a health care provider if you start sweating a lot, feel nauseous, or have pain or numbness in the arm or leg closest to your insertion site.  Get help right away if your puncture site is bleeding and the bleeding does not stop after applying firm pressure to the area. This is a medical emergency. This information is not intended to replace advice given to you by your health care provider. Make sure you discuss any questions you have with your health care provider. Document Revised: 07/27/2019 Document Reviewed: 07/27/2019 Elsevier Patient Education  2021 Elsevier Inc.  

## 2020-10-26 ENCOUNTER — Other Ambulatory Visit: Payer: Self-pay | Admitting: Cardiovascular Disease

## 2020-10-31 ENCOUNTER — Telehealth: Payer: Self-pay | Admitting: Cardiology

## 2020-10-31 NOTE — Telephone Encounter (Signed)
Patient calling  States that Dr [name] told her it would be ok for her to return to work  She will need a note for her employer Patient would be able to come by and pick up tomorrow

## 2020-11-01 NOTE — Telephone Encounter (Signed)
Spoke with Pt.  Pt returned to work yesterday.  Return to work letter created and will be available for pick up in [place].Marland Kitchen

## 2020-11-06 ENCOUNTER — Other Ambulatory Visit: Payer: Self-pay | Admitting: Oncology

## 2020-11-08 ENCOUNTER — Inpatient Hospital Stay: Payer: BC Managed Care – PPO

## 2020-11-08 ENCOUNTER — Inpatient Hospital Stay: Payer: BC Managed Care – PPO | Admitting: Oncology

## 2020-11-11 ENCOUNTER — Encounter: Payer: Self-pay | Admitting: Oncology

## 2020-11-11 ENCOUNTER — Inpatient Hospital Stay: Payer: BC Managed Care – PPO | Attending: Oncology

## 2020-11-11 ENCOUNTER — Inpatient Hospital Stay (HOSPITAL_BASED_OUTPATIENT_CLINIC_OR_DEPARTMENT_OTHER): Payer: BC Managed Care – PPO | Admitting: Oncology

## 2020-11-11 VITALS — BP 126/47 | HR 55 | Temp 97.6°F | Wt 157.1 lb

## 2020-11-11 DIAGNOSIS — Z801 Family history of malignant neoplasm of trachea, bronchus and lung: Secondary | ICD-10-CM | POA: Diagnosis not present

## 2020-11-11 DIAGNOSIS — Z8616 Personal history of COVID-19: Secondary | ICD-10-CM | POA: Insufficient documentation

## 2020-11-11 DIAGNOSIS — Z79899 Other long term (current) drug therapy: Secondary | ICD-10-CM | POA: Insufficient documentation

## 2020-11-11 DIAGNOSIS — Z833 Family history of diabetes mellitus: Secondary | ICD-10-CM | POA: Diagnosis not present

## 2020-11-11 DIAGNOSIS — Z79810 Long term (current) use of selective estrogen receptor modulators (SERMs): Secondary | ICD-10-CM | POA: Insufficient documentation

## 2020-11-11 DIAGNOSIS — Z9049 Acquired absence of other specified parts of digestive tract: Secondary | ICD-10-CM | POA: Insufficient documentation

## 2020-11-11 DIAGNOSIS — Z808 Family history of malignant neoplasm of other organs or systems: Secondary | ICD-10-CM | POA: Insufficient documentation

## 2020-11-11 DIAGNOSIS — Z803 Family history of malignant neoplasm of breast: Secondary | ICD-10-CM | POA: Insufficient documentation

## 2020-11-11 DIAGNOSIS — Z836 Family history of other diseases of the respiratory system: Secondary | ICD-10-CM | POA: Insufficient documentation

## 2020-11-11 DIAGNOSIS — Z5181 Encounter for therapeutic drug level monitoring: Secondary | ICD-10-CM | POA: Diagnosis not present

## 2020-11-11 DIAGNOSIS — N6092 Unspecified benign mammary dysplasia of left breast: Secondary | ICD-10-CM

## 2020-11-11 LAB — COMPREHENSIVE METABOLIC PANEL
ALT: 17 U/L (ref 0–44)
AST: 18 U/L (ref 15–41)
Albumin: 3.7 g/dL (ref 3.5–5.0)
Alkaline Phosphatase: 56 U/L (ref 38–126)
Anion gap: 8 (ref 5–15)
BUN: 11 mg/dL (ref 6–20)
CO2: 27 mmol/L (ref 22–32)
Calcium: 8.7 mg/dL — ABNORMAL LOW (ref 8.9–10.3)
Chloride: 105 mmol/L (ref 98–111)
Creatinine, Ser: 0.73 mg/dL (ref 0.44–1.00)
GFR, Estimated: >60 mL/min (ref >60)
Glucose, Bld: 92 mg/dL (ref 70–99)
Potassium: 3.9 mmol/L (ref 3.5–5.1)
Sodium: 140 mmol/L (ref 135–145)
Total Bilirubin: 0.7 mg/dL (ref 0.3–1.2)
Total Protein: 6.8 g/dL (ref 6.5–8.1)

## 2020-11-11 NOTE — Progress Notes (Signed)
Hematology/Oncology Consult note [facility]  Telephone:([phone] [phone]  Patient Care Team: Reubin Milan, MD as PCP - General (Internal Medicine) Antonieta Iba, MD as PCP - Cardiology (Cardiology) Lanier Prude, MD as PCP - Electrophysiology (Cardiology) Almond Lint, MD as Referring Physician (Cardiology)   Name of the patient: Tammy Acevedo  [id number]  December 17, 1970   Date of visit: 11/11/20  Diagnosis-atypical ductal hyperplasia  Chief complaint/ Reason for visit-routine follow-up of atypical ductal hyperplasia on tamoxifen  Heme/Onc history: Patient is a 50 year old premenopausal female with no prior significant medical history for breast cancer or breast biopsies. She recently underwent a screening mammogram in October 2020 with which showed possible architectural distortion in the left breast as well as a possible mass in the right breast. Ultrasound of the right breast showed a benign cyst in the right breast. 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 Patient was seen by Dr. Claudine Mouton and she underwent a lumpectomy which showed a radial scar with florid ductal hyperplasia and sclerosing adenosis. Normal residual atypical proliferative breast disease or evidence of DCIS or invasive malignancy. Margins were negative  Risks and benefits of chemoprevention for atypical ductal hyperplasia but tamoxifen was discussed.  Patient started taking tamoxifen in August 2021  Interval history-patient has not had any menstrual cycle since June 2021.  She is tolerating tamoxifen well without any significant side effects presently  ECOG PS- 1 Pain scale- 0   Review of systems- Review of Systems  Constitutional: Negative for chills, fever, malaise/fatigue and weight loss.  HENT: Negative for  congestion, ear discharge and nosebleeds.   Eyes: Negative for blurred vision.  Respiratory: Negative for cough, hemoptysis, sputum production, shortness of breath and wheezing.   Cardiovascular: Negative for chest pain, palpitations, orthopnea and claudication.  Gastrointestinal: Negative for abdominal pain, blood in stool, constipation, diarrhea, heartburn, melena, nausea and vomiting.  Genitourinary: Negative for dysuria, flank pain, frequency, hematuria and urgency.  Musculoskeletal: Negative for back pain, joint pain and myalgias.  Skin: Negative for rash.  Neurological: Negative for dizziness, tingling, focal weakness, seizures, weakness and headaches.  Endo/Heme/Allergies: Does not bruise/bleed easily.  Psychiatric/Behavioral: Negative for depression and suicidal ideas. The patient does not have insomnia.       No Known Allergies   Past Medical History:  Diagnosis Date  . Chest pain    a. 08/2015 ETT: Ex time 9:01, Max HR 166, No ECG changes->Nl study.  Marland Kitchen COVID-19 08/31/2019  . Family history of breast cancer   . Family history of lung cancer   . Family history of throat cancer   . Hypertension   . Mild Mitral regurgitation    a. 08/2015 Echo: EF 50%, no rwma, Triv TR/PR, mild MR.  . SVT (supraventricular tachycardia) (HCC)   . Wears dentures    partial upper     Past Surgical History:  Procedure Laterality Date  . BREAST BIOPSY Left 08/11/2019   distortion, coil marker, radial scar and ADH  . BREAST BIOPSY Left 10/07/2019   Procedure: BREAST BIOPSY WITH NEEDLE LOCALIZATION;  Surgeon: Campbell Lerner, MD;  Location: [facility];  Service: General;  Laterality: Left;  . BREAST LUMPECTOMY Left 10/07/2019   radial scar, ADH, dense stromal fibrosis excisied, clear surgical margins  . CARPAL TUNNEL RELEASE    . CATARACT EXTRACTION W/PHACO Right 07/13/2020   Procedure: CATARACT  EXTRACTION PHACO AND INTRAOCULAR LENS PLACEMENT (IOC) RIGHT;  Surgeon: Lockie MolaBrasington, Chadwick, MD;   [facility];  Service: Ophthalmology;  Laterality: Right;  7.53 1:21.3 9.3%  . CATARACT EXTRACTION W/PHACO Left 08/03/2020   Procedure: CATARACT EXTRACTION PHACO AND INTRAOCULAR LENS PLACEMENT (IOC) LEFT 4.52 00:44.3 10.3%;  Surgeon: Lockie MolaBrasington, Chadwick, MD;  [facility];  Service: Ophthalmology;  Laterality: Left;  . CHOLECYSTECTOMY    . COLONOSCOPY WITH PROPOFOL N/A 12/07/2019   Procedure: COLONOSCOPY WITH PROPOFOL;  Surgeon: Midge MiniumWohl, Darren, MD;  [facility];  Service: Endoscopy;  Laterality: N/A;  . SVT ABLATION N/A 10/24/2020   Procedure: SVT ABLATION;  Surgeon: Lanier PrudeLambert, Cameron T, MD;  [facility];  Service: Cardiovascular;  Laterality: N/A;  . TUBAL LIGATION      Social History   Socioeconomic History  . Marital status: Married    Spouse name: Not on file  . Number of children: 2  . Years of education: Not on file  . Highest education level: Not on file  Occupational History  . Not on file  Tobacco Use  . Smoking status: Never Smoker  . Smokeless tobacco: Never Used  Vaping Use  . Vaping Use: Never used  Substance and Sexual Activity  . Alcohol use: No    Alcohol/week: 0.0 standard drinks  . Drug use: No  . Sexual activity: Not Currently  Other Topics Concern  . Not on file  Social History Narrative  . Not on file   Social Determinants of Health   Financial Resource Strain: Not on file  Food Insecurity: Not on file  Transportation Needs: Not on file  Physical Activity: Not on file  Stress: Not on file  Social Connections: Not on file  Intimate Partner Violence: Not on file    Family History  Problem Relation Age of Onset  . Diabetes Mother   . COPD Father   . Lung cancer Father [phone]  . Breast cancer Maternal Aunt [phone]       dx again at 79~metastatic  . Cancer Paternal Aunt        jaw cancer dx 8570s  . Breast cancer Cousin        dx 50s-60s. MAT that had breast cancer's daughter  . Throat  cancer Cousin      Current Outpatient Medications:  .  acetaminophen (TYLENOL) 500 MG tablet, Take 1,000 mg by mouth every 6 (six) hours as needed for moderate pain. , Disp: , Rfl:  .  BLACK ELDERBERRY PO, Take 1 tablet by mouth daily., Disp: , Rfl:  .  loratadine (CLARITIN) 10 MG tablet, Take 10 mg by mouth at bedtime., Disp: , Rfl:  .  metoprolol tartrate (LOPRESSOR) 25 MG tablet, Take 1 tablet by mouth twice daily, Disp: 180 tablet, Rfl: 0 .  Multiple Vitamins-Minerals (MULTIVITAMIN WITH MINERALS) tablet, Take 1 tablet by mouth daily., Disp: , Rfl:  .  tamoxifen (NOLVADEX) 20 MG tablet, Take 1 tablet by mouth once daily, Disp: 30 tablet, Rfl: 0  Physical exam:  Vitals:   11/11/20 0927  BP: (!) 126/47  Pulse: (!) 55  Temp: 97.6 F (36.4 C)  TempSrc: Tympanic  SpO2: 99%  Weight: 157 lb 2 oz (71.3 kg)   Physical Exam Constitutional:      General: She is not in acute distress. Eyes:     Extraocular Movements: EOM normal.  Cardiovascular:     Rate and Rhythm: Normal rate and regular rhythm.  Pulmonary:  Effort: Pulmonary effort is normal.  Skin:    General: Skin is warm and dry.  Neurological:     Mental Status: She is alert and oriented to person, place, and time.    Breast exam was performed in seated and lying down position. Patient is status post left lumpectomy with a well-healed surgical scar. No evidence of any palpable masses. No evidence of axillary adenopathy. No evidence of any palpable masses or lumps in the right breast. No evidence of right axillary adenopathy   CMP Latest Ref Rng & Units 11/11/2020  Glucose 70 - 99 mg/dL 92  BUN 6 - 20 mg/dL 11  Creatinine 9.37 - 1.69 mg/dL 6.78  Sodium 938 - 101 mmol/L 140  Potassium 3.5 - 5.1 mmol/L 3.9  Chloride 98 - 111 mmol/L 105  CO2 22 - 32 mmol/L 27  Calcium 8.9 - 10.3 mg/dL 7.5(Z)  Total Protein 6.5 - 8.1 g/dL 6.8  Total Bilirubin 0.3 - 1.2 mg/dL 0.7  Alkaline Phos 38 - 126 U/L 56  AST 15 - 41 U/L 18  ALT  0 - 44 U/L 17   CBC Latest Ref Rng & Units 10/21/2020  WBC 4.0 - 10.5 K/uL 5.6  Hemoglobin 12.0 - 15.0 g/dL 02.5  Hematocrit 85.2 - 46.0 % 38.2  Platelets 150 - 400 K/uL 208    No images are attached to the encounter.  ECHOCARDIOGRAM COMPLETE  Result Date: 10/12/2020    ECHOCARDIOGRAM REPORT   Patient Name:   Leauna MURRAY Leaming Date of Exam: 10/12/2020 Medical Rec #:  [id number]              Height:       65.0 in Accession #:    [id number]             Weight:       161.0 lb Date of Birth:  11-Aug-1971              BSA:          1.804 m Patient Age:    49 years               BP:           128/76 mmHg Patient Gender: F                      HR:           60 bpm. Exam Location:  [place] Procedure: 2D Echo, Cardiac Doppler, Color Doppler and Strain Analysis Indications:    I47.1, PSVT  History:        Patient has no prior history of Echocardiogram examinations.                 Arrythmias:PSVT; Risk Factors:Hypertension, Non-Smoker and Covid                 08/31/19.  Sonographer:    Quentin Ore RDMS, RVT, RDCS Referring Phys: [id number] Rossie Muskrat LAMBERT IMPRESSIONS  1. Left ventricular ejection fraction, by estimation, is 60 to 65%. The left ventricle has normal function. The left ventricle has no regional wall motion abnormalities. Left ventricular diastolic parameters were normal. The average left ventricular global longitudinal strain is -18.7 %. The global longitudinal strain is normal.  2. Right ventricular systolic function is normal. The right ventricular size is normal.  3. The mitral valve is normal in structure. No evidence of mitral valve regurgitation. No evidence of mitral stenosis.  4. The aortic valve is  tricuspid. Aortic valve regurgitation is not visualized. No aortic stenosis is present.  5. The inferior vena cava is normal in size with greater than 50% respiratory variability, suggesting right atrial pressure of 3 mmHg. FINDINGS  Left Ventricle: Left ventricular ejection fraction, by  estimation, is 60 to 65%. The left ventricle has normal function. The left ventricle has no regional wall motion abnormalities. The average left ventricular global longitudinal strain is -18.7 %. The global longitudinal strain is normal. The left ventricular internal cavity size was normal in size. There is no left ventricular hypertrophy. Left ventricular diastolic parameters were normal. Right Ventricle: The right ventricular size is normal. No increase in right ventricular wall thickness. Right ventricular systolic function is normal. Left Atrium: Left atrial size was normal in size. Right Atrium: Right atrial size was normal in size. Pericardium: There is no evidence of pericardial effusion. Mitral Valve: The mitral valve is normal in structure. No evidence of mitral valve regurgitation. No evidence of mitral valve stenosis. Tricuspid Valve: The tricuspid valve is normal in structure. Tricuspid valve regurgitation is not demonstrated. No evidence of tricuspid stenosis. Aortic Valve: The aortic valve is tricuspid. Aortic valve regurgitation is not visualized. No aortic stenosis is present. Aortic valve mean gradient measures 5.0 mmHg. Aortic valve peak gradient measures 9.9 mmHg. Aortic valve area, by VTI measures 2.13 cm. Pulmonic Valve: The pulmonic valve was normal in structure. Pulmonic valve regurgitation is trivial. No evidence of pulmonic stenosis. Aorta: The aortic root is normal in size and structure. Venous: The inferior vena cava is normal in size with greater than 50% respiratory variability, suggesting right atrial pressure of 3 mmHg. IAS/Shunts: No atrial level shunt detected by color flow Doppler.  LEFT VENTRICLE PLAX 2D LVIDd:         4.80 cm     Diastology LVIDs:         3.20 cm     LV e' medial:    8.16 cm/s LV PW:         0.90 cm     LV E/e' medial:  12.2 LV IVS:        0.80 cm     LV e' lateral:   10.20 cm/s LVOT diam:     1.80 cm     LV E/e' lateral: 9.7 LV SV:         75 LV SV Index:   42           2D Longitudinal Strain LVOT Area:     2.54 cm    2D Strain GLS Avg:     -18.7 %  LV Volumes (MOD) LV vol d, MOD A2C: 70.9 ml LV vol d, MOD A4C: 71.6 ml LV vol s, MOD A2C: 29.3 ml LV vol s, MOD A4C: 28.1 ml LV SV MOD A2C:     41.6 ml LV SV MOD A4C:     71.6 ml LV SV MOD BP:      43.8 ml RIGHT VENTRICLE             IVC RV Basal diam:  2.80 cm     IVC diam: 1.50 cm RV S prime:     11.10 cm/s TAPSE (M-mode): 2.7 cm LEFT ATRIUM             Index       RIGHT ATRIUM           Index LA diam:        3.90 cm 2.16 cm/m  RA  Area:     11.10 cm LA Vol (A2C):   33.5 ml 18.57 ml/m RA Volume:   23.30 ml  12.92 ml/m LA Vol (A4C):   40.3 ml 22.34 ml/m LA Biplane Vol: 37.5 ml 20.79 ml/m  AORTIC VALVE                    PULMONIC VALVE AV Area (Vmax):    2.27 cm     PV Vmax:       0.86 m/s AV Area (Vmean):   2.03 cm     PV Peak grad:  3.0 mmHg AV Area (VTI):     2.13 cm AV Vmax:           157.00 cm/s AV Vmean:          107.000 cm/s AV VTI:            0.353 m AV Peak Grad:      9.9 mmHg AV Mean Grad:      5.0 mmHg LVOT Vmax:         140.00 cm/s LVOT Vmean:        85.200 cm/s LVOT VTI:          0.296 m LVOT/AV VTI ratio: 0.84  AORTA Ao Root diam: 2.70 cm Ao Asc diam:  3.00 cm Ao Arch diam: 3.0 cm MITRAL VALVE               TRICUSPID VALVE MV Area (PHT): 4.68 cm    TR Peak grad:   24.4 mmHg MV Decel Time: 162 msec    TR Vmax:        247.00 cm/s MV E velocity: 99.40 cm/s MV A velocity: 60.80 cm/s  SHUNTS MV E/A ratio:  1.63        Systemic VTI:  0.30 m                            Systemic Diam: 1.80 cm Debbe Odea MD Electronically signed by Debbe Odea MD Signature Date/Time: 10/12/2020/3:56:38 PM    Final      Assessment and plan- Patient is a 50 y.o. female with h/o diagnosed radial scar of the left breast with focus of atypical ductal hyperplasia s/p lumpectomy.  She is currently on tamoxifen for chemoprevention and is here for routine follow-up  Patient is tolerating tamoxifen well so far without any  significant side effects.  She will continue taking this until August 2026.  Patient has not had menstrual cycle since June 2021.  She will likely menopausal in the next 6 to 7 months.  However I plan to keep her on tamoxifen instead of switching her to AI in the future.  I will see her back in 6 months for a video visit.  No labs   Visit Diagnosis 1. Encounter for monitoring tamoxifen therapy   2. Atypical ductal hyperplasia of left breast      Dr. Owens Shark, MD, MPH [facility] [phone] 11/11/2020 9:41 AM

## 2020-11-30 ENCOUNTER — Encounter: Payer: Self-pay | Admitting: Cardiology

## 2020-11-30 ENCOUNTER — Ambulatory Visit (INDEPENDENT_AMBULATORY_CARE_PROVIDER_SITE_OTHER): Payer: BC Managed Care – PPO | Admitting: Cardiology

## 2020-11-30 ENCOUNTER — Other Ambulatory Visit: Payer: Self-pay

## 2020-11-30 VITALS — BP 110/72 | HR 55 | Ht 65.0 in | Wt 157.0 lb

## 2020-11-30 DIAGNOSIS — I10 Essential (primary) hypertension: Secondary | ICD-10-CM | POA: Diagnosis not present

## 2020-11-30 DIAGNOSIS — I471 Supraventricular tachycardia: Secondary | ICD-10-CM

## 2020-11-30 NOTE — Patient Instructions (Addendum)
Medication Instructions:  Your physician has recommended you make the following change in your medication:   1.  You will wean off your metoprolol.  This has been removed from your med list.  *If you need a refill on your cardiac medications before your next appointment, please call your pharmacy*  Lab Work: None ordered. If you have labs (blood work) drawn today and your tests are completely normal, you will receive your results only by: Marland Kitchen MyChart Message (if you have MyChart) OR . A paper copy in the mail If you have any lab test that is abnormal or we need to change your treatment, we will call you to review the results.  Testing/Procedures: None ordered.  Follow-Up: At [facility], you and your health needs are our priority.  As part of our continuing mission to provide you with exceptional heart care, we have created designated Provider Care Teams.  These Care Teams include your primary Cardiologist (physician) and Advanced Practice Providers (APPs -  Physician Assistants and Nurse Practitioners) who all work together to provide you with the care you need, when you need it.  Your next appointment:   Your physician wants you to follow-up in: as needed with Dr. [name].

## 2020-11-30 NOTE — Progress Notes (Signed)
Electrophysiology Office Follow up Visit Note:    Date:  11/30/2020   ID:  Tammy Acevedo, DOB [birth year], MRN [id number]  PCP:  Reubin Milan, MD  [facility] Cardiologist:  Julien Nordmann, MD  [facility] Electrophysiologist:  Lanier Prude, MD    Interval History:    Tammy Acevedo is a 50 y.o. female who presents for a follow up visit after a successful slow pathway ablation on October 24, 2020.  During the procedure, antegrade dual AV nodal physiology was demonstrated with double typical AV nodal echo beats.  A slow pathway modification was successful.  During the procedure there was a nonclinical atrial tachycardia with proximal to distal CS activation inducible with a cycle length of 250 bpm.  This was felt to be nonclinical and does not targeted with ablation.  Since the ablation, the patient's been doing well without recurrence of her arrhythmia. Her groin sites have healed well.    Past Medical History:  Diagnosis Date  . Chest pain    a. 08/2015 ETT: Ex time 9:01, Max HR 166, No ECG changes->Nl study.  Marland Kitchen COVID-19 08/31/2019  . Family history of breast cancer   . Family history of lung cancer   . Family history of throat cancer   . Hypertension   . Mild Mitral regurgitation    a. 08/2015 Echo: EF 50%, no rwma, Triv TR/PR, mild MR.  . SVT (supraventricular tachycardia) (HCC)   . Wears dentures    partial upper    Past Surgical History:  Procedure Laterality Date  . BREAST BIOPSY Left 08/11/2019   distortion, coil marker, radial scar and ADH  . BREAST BIOPSY Left 10/07/2019   Procedure: BREAST BIOPSY WITH NEEDLE LOCALIZATION;  Surgeon: Campbell Lerner, MD;  Location: [facility] ORS;  Service: General;  Laterality: Left;  . BREAST LUMPECTOMY Left 10/07/2019   radial scar, ADH, dense stromal fibrosis excisied, clear surgical margins  . CARPAL TUNNEL RELEASE    . CATARACT EXTRACTION W/PHACO Right 07/13/2020   Procedure: CATARACT EXTRACTION PHACO  AND INTRAOCULAR LENS PLACEMENT (IOC) RIGHT;  Surgeon: Lockie Mola, MD;  Location: [facility];  Service: Ophthalmology;  Laterality: Right;  7.53 1:21.3 9.3%  . CATARACT EXTRACTION W/PHACO Left 08/03/2020   Procedure: CATARACT EXTRACTION PHACO AND INTRAOCULAR LENS PLACEMENT (IOC) LEFT 4.52 00:44.3 10.3%;  Surgeon: Lockie Mola, MD;  Location: [facility];  Service: Ophthalmology;  Laterality: Left;  . CHOLECYSTECTOMY    . COLONOSCOPY WITH PROPOFOL N/A 12/07/2019   Procedure: COLONOSCOPY WITH PROPOFOL;  Surgeon: Midge Minium, MD;  Location: [facility];  Service: Endoscopy;  Laterality: N/A;  . SVT ABLATION N/A 10/24/2020   Procedure: SVT ABLATION;  Surgeon: Lanier Prude, MD;  Location: [facility] INVASIVE CV LAB;  Service: Cardiovascular;  Laterality: N/A;  . TUBAL LIGATION      Current Medications: Current Meds  Medication Sig  . acetaminophen (TYLENOL) 500 MG tablet Take 1,000 mg by mouth every 6 (six) hours as needed for moderate pain.   Marland Kitchen BLACK ELDERBERRY PO Take 1 tablet by mouth daily.  Marland Kitchen loratadine (CLARITIN) 10 MG tablet Take 10 mg by mouth at bedtime.  . Multiple Vitamins-Minerals (MULTIVITAMIN WITH MINERALS) tablet Take 1 tablet by mouth daily.  . tamoxifen (NOLVADEX) 20 MG tablet Take 1 tablet by mouth once daily  . [DISCONTINUED] metoprolol tartrate (LOPRESSOR) 25 MG tablet Take 1 tablet by mouth twice daily     Allergies:   Patient has no known allergies.   Social  History   Socioeconomic History  . Marital status: Married    Spouse name: Not on file  . Number of children: 2  . Years of education: Not on file  . Highest education level: Not on file  Occupational History  . Not on file  Tobacco Use  . Smoking status: Never Smoker  . Smokeless tobacco: Never Used  Vaping Use  . Vaping Use: Never used  Substance and Sexual Activity  . Alcohol use: No    Alcohol/week: 0.0 standard drinks  . Drug use: No  . Sexual activity: Not  Currently  Other Topics Concern  . Not on file  Social History Narrative  . Not on file   Social Determinants of Health   Financial Resource Strain: Not on file  Food Insecurity: Not on file  Transportation Needs: Not on file  Physical Activity: Not on file  Stress: Not on file  Social Connections: Not on file     Family History: The patient's family history includes Breast cancer in her cousin; Breast cancer (age of onset: 50) in her maternal aunt; COPD in her father; Cancer in her paternal aunt; Diabetes in her mother; Lung cancer (age of onset: 62) in her father; Throat cancer in her cousin.  ROS:   Please see the history of present illness.    All other systems reviewed and are negative.  EKGs/Labs/Other Studies Reviewed:    The following studies were reviewed today:   EKG:  The ekg ordered today demonstrates sinus rhythm. No evidence of preexcitation. QTC is normal at 400 ms.  Recent Labs: 09/29/2020: TSH 1.770 10/21/2020: Hemoglobin 12.8; Platelets 208 11/11/2020: ALT 17; BUN 11; Creatinine, Ser 0.73; Potassium 3.9; Sodium 140  Recent Lipid Panel    Component Value Date/Time   CHOL 150 09/29/2020 1023   TRIG 116 09/29/2020 1023   HDL 44 09/29/2020 1023   CHOLHDL 3.4 09/29/2020 1023   CHOLHDL 4 12/15/2012 0857   VLDL 14.4 12/15/2012 0857   LDLCALC 85 09/29/2020 1023    Physical Exam:    VS:  BP 110/72 (BP Location: Left Arm, Patient Position: Sitting, Cuff Size: Normal)   Pulse (!) 55   Ht 5\' 5"  (1.651 m)   Wt 157 lb (71.2 kg)   SpO2 96%   BMI 26.13 kg/m     Wt Readings from Last 3 Encounters:  11/30/20 157 lb (71.2 kg)  11/11/20 157 lb 2 oz (71.3 kg)  10/24/20 160 lb (72.6 kg)     GEN:  Well nourished, well developed in no acute distress HEENT: Normal NECK: No JVD; No carotid bruits LYMPHATICS: No lymphadenopathy CARDIAC: RRR, no murmurs, rubs, gallops RESPIRATORY:  Clear to auscultation without rales, wheezing or rhonchi  ABDOMEN: Soft,  non-tender, non-distended MUSCULOSKELETAL:  No edema; No deformity  SKIN: Warm and dry NEUROLOGIC:  Alert and oriented x 3 PSYCHIATRIC:  Normal affect   ASSESSMENT:    1. PSVT (paroxysmal supraventricular tachycardia) (HCC)   2. Essential hypertension    PLAN:    In order of problems listed above:  1. 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  Slow pathway modification performed October 24, 2020.  Patient has not had recurrence of her arrhythmia.  We discussed reducing her metoprolol to 12.5 mg twice daily for several days and then discontinuing.  2. Hypertension Blood pressure well within goal range during today's visit. She will keep an eye on her blood pressure she stops the metoprolol to  confirm that it stays in range.   Follow-up as needed.   Medication Adjustments/Labs and Tests Ordered: Current medicines are reviewed at length with the patient today.  Concerns regarding medicines are outlined above.  Orders Placed This Encounter  Procedures  . EKG 12-Lead   No orders of the defined types were placed in this encounter.    Signed, Steffanie Dunn, MD, [facility]  11/30/2020 10:51 AM    Electrophysiology [facility]

## 2020-12-07 ENCOUNTER — Other Ambulatory Visit: Payer: Self-pay | Admitting: Oncology

## 2020-12-10 IMAGING — MG DIGITAL SCREENING BILAT W/ TOMO W/ CAD
8 series · 8 of 24 positions shown · non-contrast
Comparison: Previous exam(s).

CLINICAL DATA: Screening.

EXAM:
DIGITAL SCREENING BILATERAL MAMMOGRAM WITH TOMO AND CAD

[R MLO synth-2D]
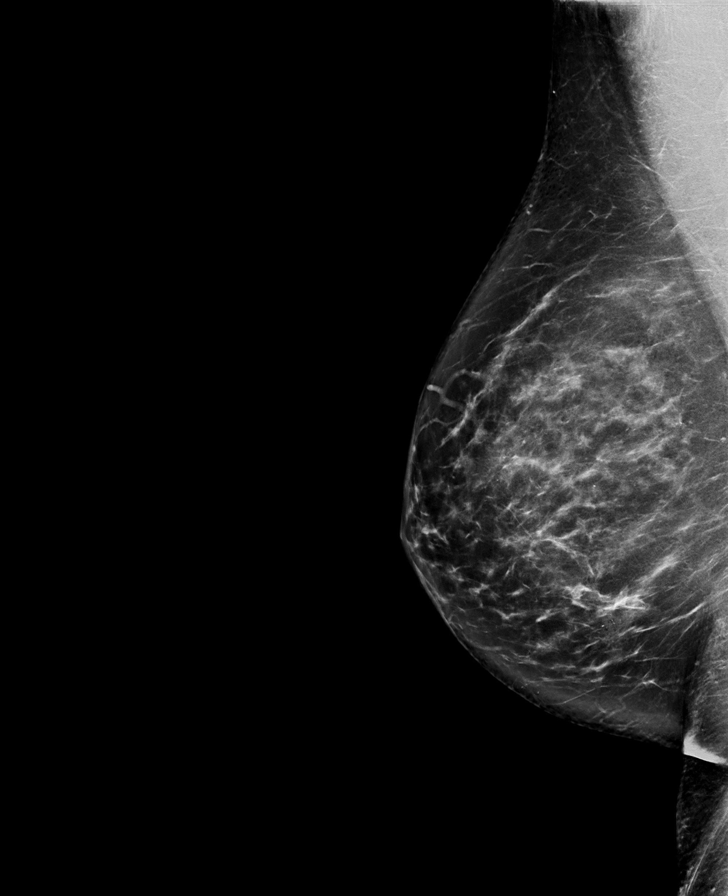

[L CC synth-2D]
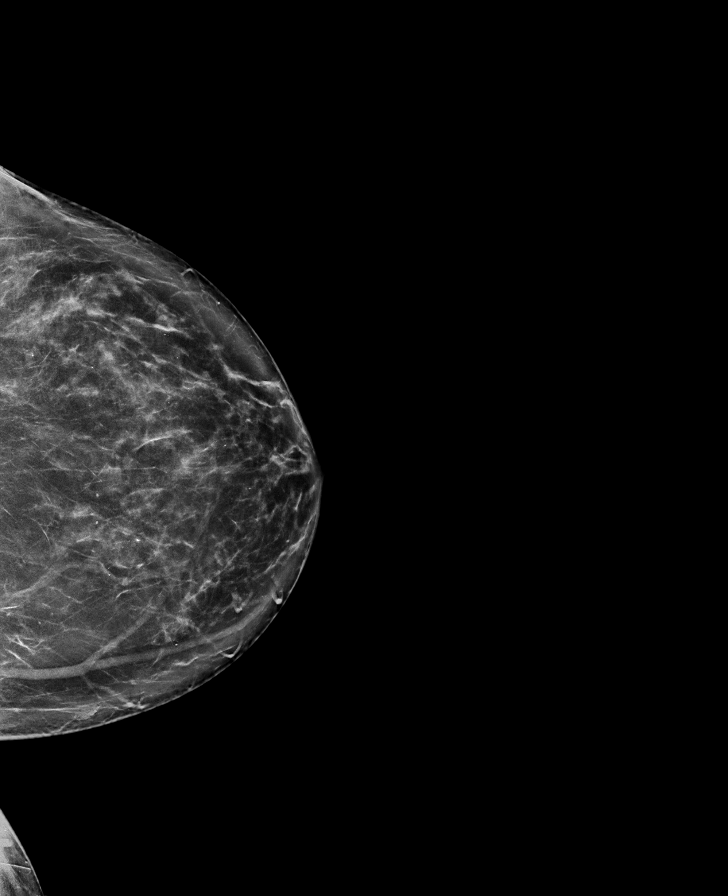

[R CC synth-2D]
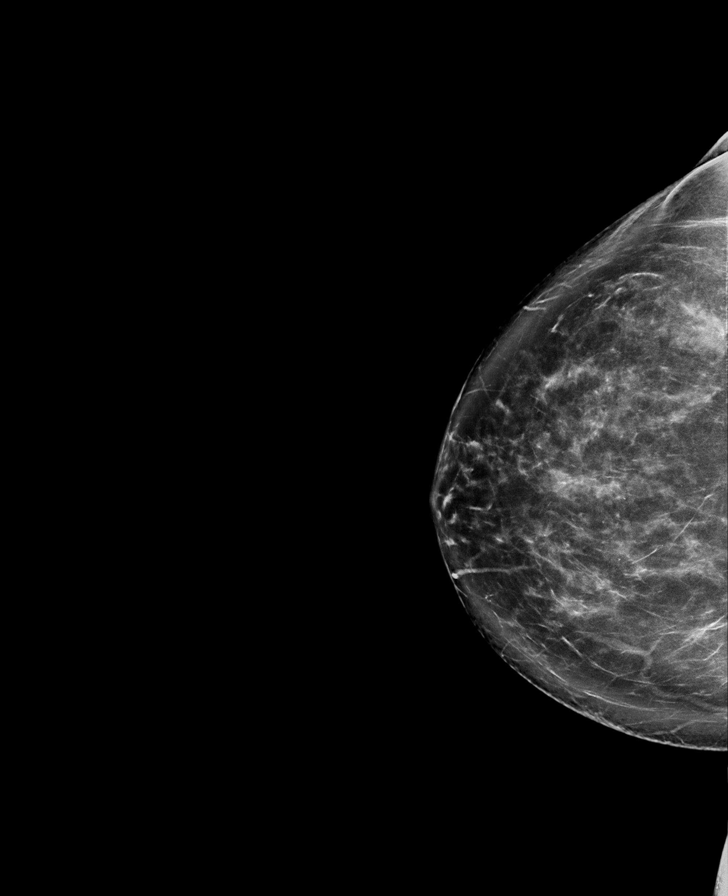

[L MLO synth-2D]
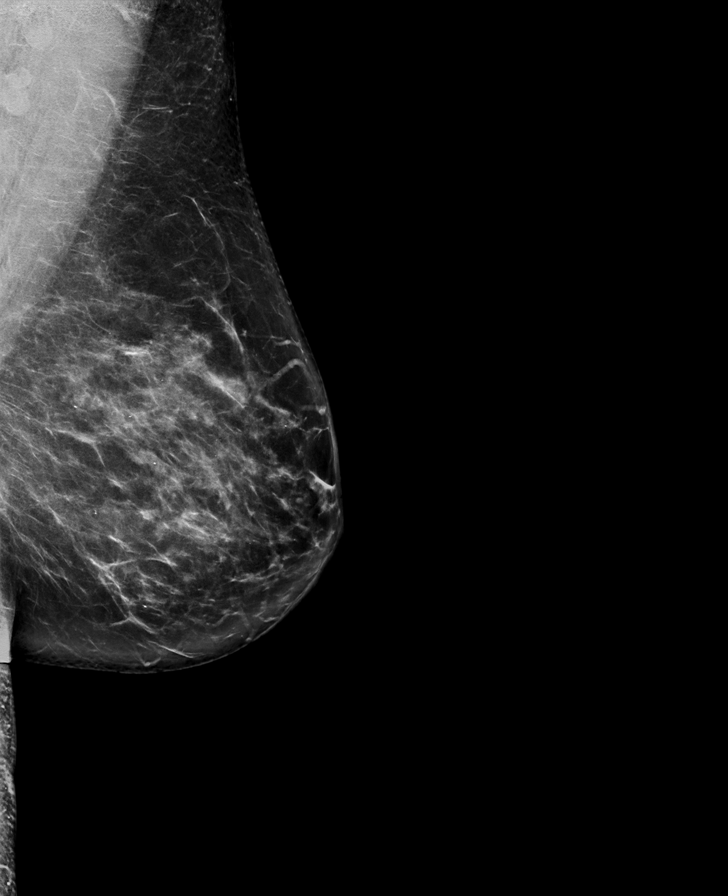

[L MLO tomo · tomo slice 45/89.0]
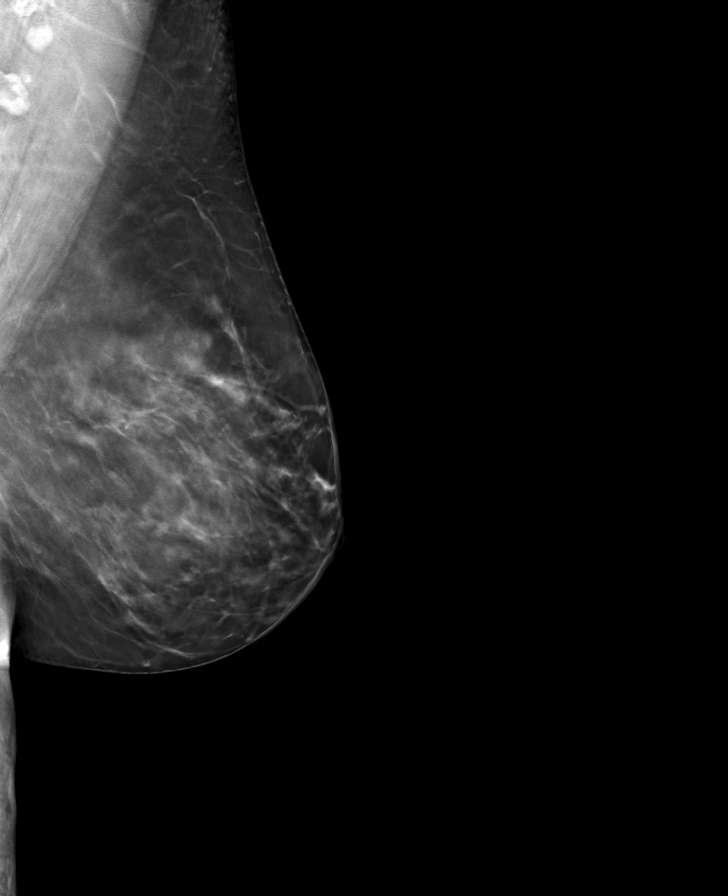

[R MLO tomo · tomo slice 45/89.0]
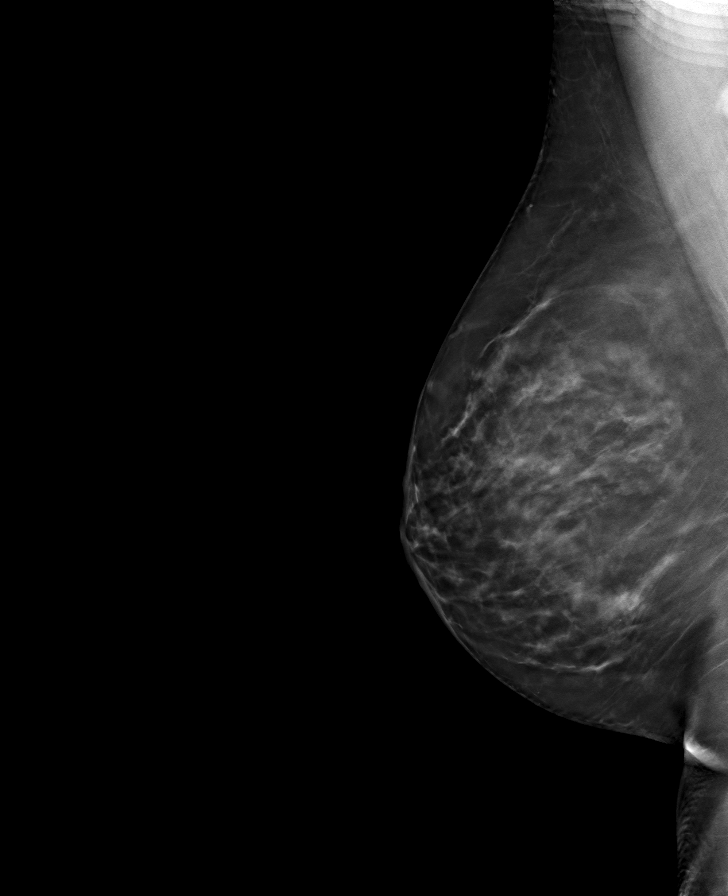

[R CC tomo · tomo slice 45/90.0]
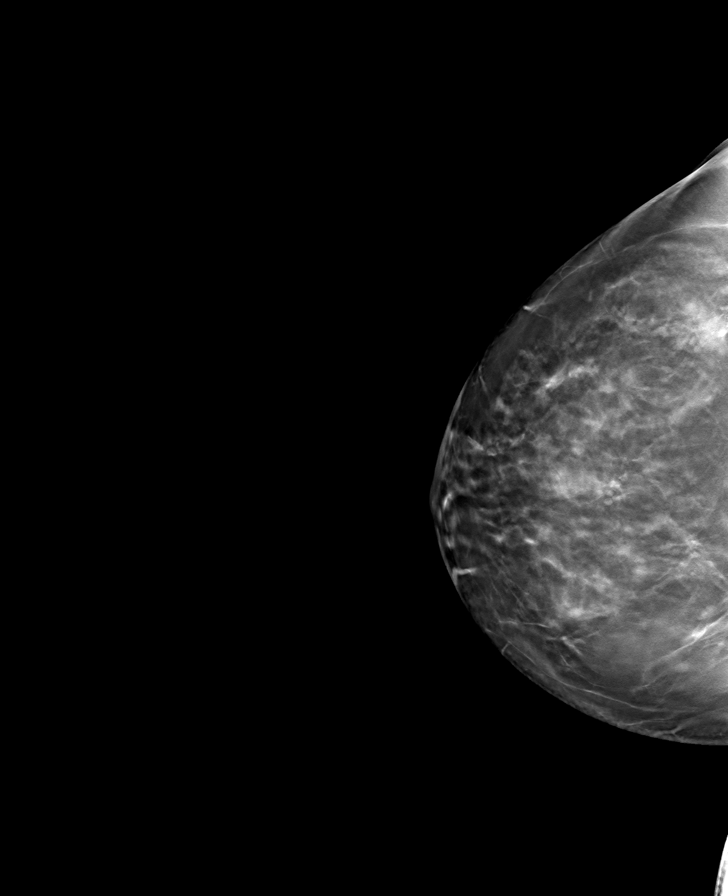

[L CC tomo · tomo slice 44/87.0]
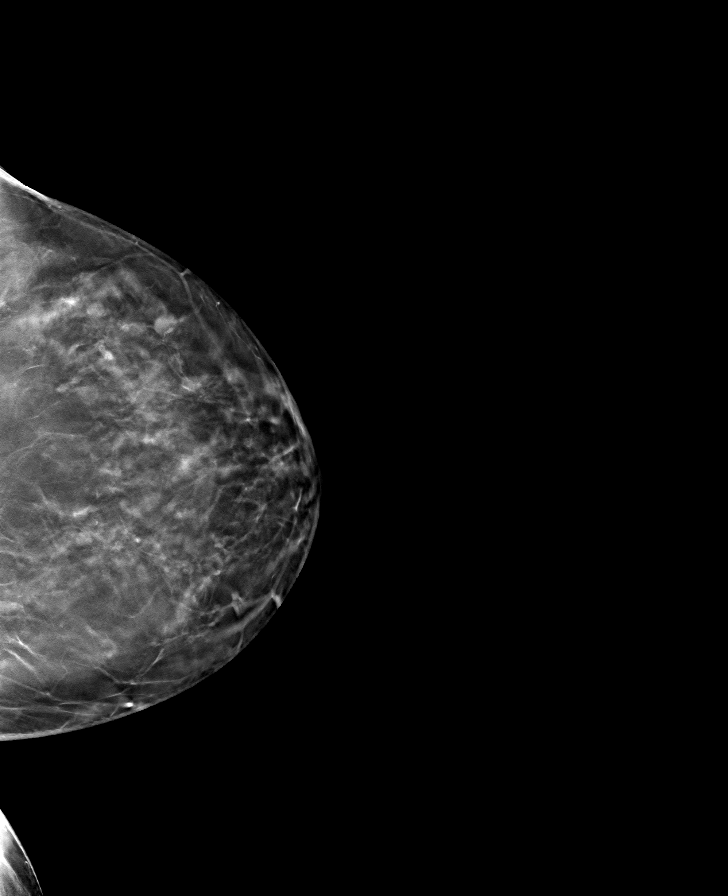

[8 of 24 positions shown; findings below may reference images not displayed]

ACR Breast Density Category c: The breast tissue is heterogeneously
dense, which may obscure small masses.
FINDINGS: There are no findings suspicious for malignancy. Images were
processed with CAD.
IMPRESSION: No mammographic evidence of malignancy. A result letter of this
screening mammogram will be mailed directly to the patient.

RECOMMENDATION:
Screening mammogram in one year. (Code:[id number]-U-LHB)

BI-RADS CATEGORY  1: Negative.

## 2021-01-12 ENCOUNTER — Other Ambulatory Visit: Payer: Self-pay | Admitting: Oncology

## 2021-01-18 ENCOUNTER — Other Ambulatory Visit: Payer: Self-pay

## 2021-01-18 ENCOUNTER — Ambulatory Visit (INDEPENDENT_AMBULATORY_CARE_PROVIDER_SITE_OTHER): Payer: BC Managed Care – PPO | Admitting: Nurse Practitioner

## 2021-01-18 ENCOUNTER — Encounter: Payer: Self-pay | Admitting: Nurse Practitioner

## 2021-01-18 VITALS — BP 108/70 | HR 67 | Ht 66.0 in | Wt 158.5 lb

## 2021-01-18 DIAGNOSIS — I10 Essential (primary) hypertension: Secondary | ICD-10-CM

## 2021-01-18 DIAGNOSIS — I471 Supraventricular tachycardia: Secondary | ICD-10-CM

## 2021-01-18 NOTE — Patient Instructions (Signed)
Medication Instructions:  ?No changes at this time. ? ?*If you need a refill on your cardiac medications before your next appointment, please call your pharmacy* ? ? ?Lab Work: ?None ? ?If you have labs (blood work) drawn today and your tests are completely normal, you will receive your results only by: ?MyChart Message (if you have MyChart) OR ?A paper copy in the mail ?If you have any lab test that is abnormal or we need to change your treatment, we will call you to review the results. ? ? ?Testing/Procedures: ?None ? ? ?Follow-Up: ?At [facility], you and your health needs are our priority.  As part of our continuing mission to provide you with exceptional heart care, we have created designated Provider Care Teams.  These Care Teams include your primary Cardiologist (physician) and Advanced Practice Providers (APPs -  Physician Assistants and Nurse Practitioners) who all work together to provide you with the care you need, when you need it. ? ? ?Your next appointment:   ?1 year(s) ? ?The format for your next appointment:   ?In Person ? ?Provider:   ?Timothy Gollan, MD or Christopher Berge, NP  ? ?

## 2021-01-18 NOTE — Progress Notes (Signed)
Office Visit    Patient Name: Tammy Acevedo Date of Encounter: 01/18/2021  Primary Care Provider:  Reubin Milan, MD Primary Cardiologist:  Julien Nordmann, MD  Chief Complaint    50 y/o ? w/ a h/o PSVT/AVNRT, chest pain, HTN, and mild MR, who presents for f/u after SVT ablation earlier this year.  Past Medical History    Past Medical History:  Diagnosis Date  . AVNRT (AV nodal re-entry tachycardia) (HCC)    a. 10/2020 s/p EPS and RFCA.  Marland Kitchen Chest pain    a. 08/2015 ETT: Ex time 9:01, Max HR 166, No ECG changes->Nl study.  Marland Kitchen COVID-19 08/31/2019  . Family history of breast cancer   . Family history of lung cancer   . Family history of throat cancer   . Hypertension   . Mild Mitral regurgitation    a. 08/2015 Echo: EF 50%, no rwma, Triv TR/PR, mild MR; b. 10/2020 Echo: EF 60-65%, no rwma, nl RV size/fxn, no MR/MS.  Marland Kitchen Wears dentures    partial upper   Past Surgical History:  Procedure Laterality Date  . BREAST BIOPSY Left 08/11/2019   distortion, coil marker, radial scar and ADH  . BREAST BIOPSY Left 10/07/2019   Procedure: BREAST BIOPSY WITH NEEDLE LOCALIZATION;  Surgeon: Campbell Lerner, MD;  Location: [facility] ORS;  Service: General;  Laterality: Left;  . BREAST LUMPECTOMY Left 10/07/2019   radial scar, ADH, dense stromal fibrosis excisied, clear surgical margins  . CARPAL TUNNEL RELEASE    . CATARACT EXTRACTION W/PHACO Right 07/13/2020   Procedure: CATARACT EXTRACTION PHACO AND INTRAOCULAR LENS PLACEMENT (IOC) RIGHT;  Surgeon: Lockie Mola, MD;  Location: [facility];  Service: Ophthalmology;  Laterality: Right;  7.53 1:21.3 9.3%  . CATARACT EXTRACTION W/PHACO Left 08/03/2020   Procedure: CATARACT EXTRACTION PHACO AND INTRAOCULAR LENS PLACEMENT (IOC) LEFT 4.52 00:44.3 10.3%;  Surgeon: Lockie Mola, MD;  Location: [facility];  Service: Ophthalmology;  Laterality: Left;  . CHOLECYSTECTOMY    . COLONOSCOPY WITH PROPOFOL N/A 12/07/2019    Procedure: COLONOSCOPY WITH PROPOFOL;  Surgeon: Midge Minium, MD;  Location: [facility];  Service: Endoscopy;  Laterality: N/A;  . SVT ABLATION N/A 10/24/2020   Procedure: SVT ABLATION;  Surgeon: Lanier Prude, MD;  Location: [facility] INVASIVE CV LAB;  Service: Cardiovascular;  Laterality: N/A;  . TUBAL LIGATION      Allergies  No Known Allergies  History of Present Illness    50 y/o ? w/ the above PMH including SVT/AVNRT, chest pain, HTN, and mild MR.  She was prev eval @ [facility] in 2016 in the setting of c/p, w/ stress testing showing good exercise tolerance and no acute ST/T changes.  Echo @ the time showed an EF of 50% w/o regional wall motion abnormalities.  Mild MR was noted.  In 10/2019, she underwent lumpectomy of the L breast in the setting of focal atypical ductal hyperplasia.  She tolerated this well and has been followed by oncology since, and is currently taking tamoxifen.  Throughout the Fall of 2021, she noted increasing frequency of palpitations/SVT w/ 2 ED visits.  She was referred to EP [name]) and underwent successful catheter ablation for AVNRT.  During the procedure, there was a nonclinical atrial tachycardia noted.  This was not targeted for ablation.  She was last seen by EP on 11/30/2020 and was doing well w/o recurrent tachycardia.  Since then, she has continued to do well.  She is eager to get outside  as the weather improves as last year she was somewhat sidelined by fear regarding palpitations and since her procedure, she has been completely asymptomatic.  She is now off of beta-blocker therapy and her blood pressures have been stable at home.  She denies chest pain, dyspnea, PND, orthopnea, dizziness, syncope, edema, or early satiety.  Home Medications    Prior to Admission medications   Medication Sig Start Date End Date Taking? Authorizing Provider  acetaminophen (TYLENOL) 500 MG tablet Take 1,000 mg by mouth every 6 (six) hours as needed for  moderate pain.     [provider]  BLACK ELDERBERRY PO Take 1 tablet by mouth daily.    [provider]  loratadine (CLARITIN) 10 MG tablet Take 10 mg by mouth at bedtime.    [provider]  Multiple Vitamins-Minerals (MULTIVITAMIN WITH MINERALS) tablet Take 1 tablet by mouth daily.    [provider]  tamoxifen (NOLVADEX) 20 MG tablet Take 1 tablet by mouth once daily 01/12/21   Creig Hines, MD    Review of Systems    She continues to do exceptionally well. She denies chest pain, palpitations, dyspnea, pnd, orthopnea, n, v, dizziness, syncope, edema, weight gain, or early satiety.  All other systems reviewed and are otherwise negative except as noted above.  Physical Exam    VS:  BP 108/70 (BP Location: Left Arm, Patient Position: Sitting, Cuff Size: Normal)   Pulse 67   Ht 5\' 6"  (1.676 m)   Wt 158 lb 8 oz (71.9 kg)   SpO2 98%   BMI 25.58 kg/m  , BMI Body mass index is 25.58 kg/m. GEN: Well nourished, well developed, in no acute distress. HEENT: normal. Neck: Supple, no JVD, carotid bruits, or masses. Cardiac: RRR, no murmurs, rubs, or gallops. No clubbing, cyanosis, edema.  Radials/PT 2+ and equal bilaterally.  Respiratory:  Respirations regular and unlabored, clear to auscultation bilaterally. GI: Soft, nontender, nondistended, BS + x 4. MS: no deformity or atrophy. Skin: warm and dry, no rash. Neuro:  Strength and sensation are intact. Psych: Normal affect.  Accessory Clinical Findings     Lab Results  Component Value Date   WBC 5.6 10/21/2020   HGB 12.8 10/21/2020   HCT 38.2 10/21/2020   MCV 87.4 10/21/2020   PLT 208 10/21/2020   Lab Results  Component Value Date   CREATININE 0.73 11/11/2020   BUN 11 11/11/2020   NA 140 11/11/2020   K 3.9 11/11/2020   CL 105 11/11/2020   CO2 27 11/11/2020   Lab Results  Component Value Date   ALT 17 11/11/2020   AST 18 11/11/2020   ALKPHOS 56 11/11/2020   BILITOT 0.7 11/11/2020    Lab Results  Component Value Date   CHOL 150 09/29/2020   HDL 44 09/29/2020   LDLCALC 85 09/29/2020   TRIG 116 09/29/2020   CHOLHDL 3.4 09/29/2020     Assessment & Plan    1.  PSVT/AVNRT: Status post catheter ablation in January of this year without recurrent palpitations since.  She has been weaned off of beta-blocker therapy and has continued to do well.  2.  Essential hypertension: Patient is now off of beta-blocker therapy and remains normotensive.  3.  Disposition: Follow-up in 1 year or sooner if necessary.  February, NP 01/18/2021, 9:35 AM

## 2021-02-15 ENCOUNTER — Other Ambulatory Visit: Payer: Self-pay | Admitting: Oncology

## 2021-03-21 ENCOUNTER — Other Ambulatory Visit: Payer: Self-pay | Admitting: Oncology

## 2021-03-24 ENCOUNTER — Ambulatory Visit: Payer: BC Managed Care – PPO | Admitting: Internal Medicine

## 2021-03-24 ENCOUNTER — Other Ambulatory Visit: Payer: Self-pay

## 2021-03-24 ENCOUNTER — Encounter: Payer: Self-pay | Admitting: Internal Medicine

## 2021-03-24 VITALS — BP 122/84 | HR 76 | Temp 98.2°F | Ht 65.0 in | Wt 158.0 lb

## 2021-03-24 DIAGNOSIS — R252 Cramp and spasm: Secondary | ICD-10-CM

## 2021-03-24 DIAGNOSIS — G5603 Carpal tunnel syndrome, bilateral upper limbs: Secondary | ICD-10-CM

## 2021-03-24 DIAGNOSIS — I471 Supraventricular tachycardia: Secondary | ICD-10-CM | POA: Diagnosis not present

## 2021-03-24 NOTE — Progress Notes (Signed)
Date:  03/24/2021   Name:  Tammy Acevedo   DOB:  1970-10-29   MRN:  [id number]   Chief Complaint: cramping  (cramps in feet, toes and hands for 1 month and more on and off, patient would like her potassium check )  HPI Muscle cramps - over the past month, off and on.  Worse when stretching in bed.  Both calfs are affected.  She is not taking any new medication, no diuretics.    CTS - having some cramps in left hand, worse with talking on the phone, some tingling.  Had surgery on the right but not the left.  Lab Results  Component Value Date   CREATININE 0.73 11/11/2020   BUN 11 11/11/2020   NA 140 11/11/2020   K 3.9 11/11/2020   CL 105 11/11/2020   CO2 27 11/11/2020   Lab Results  Component Value Date   CHOL 150 09/29/2020   HDL 44 09/29/2020   LDLCALC 85 09/29/2020   TRIG 116 09/29/2020   CHOLHDL 3.4 09/29/2020   Lab Results  Component Value Date   TSH 1.770 09/29/2020   No results found for: HGBA1C Lab Results  Component Value Date   WBC 5.6 10/21/2020   HGB 12.8 10/21/2020   HCT 38.2 10/21/2020   MCV 87.4 10/21/2020   PLT 208 10/21/2020   Lab Results  Component Value Date   ALT 17 11/11/2020   AST 18 11/11/2020   ALKPHOS 56 11/11/2020   BILITOT 0.7 11/11/2020     Review of Systems  Constitutional:  Negative for chills, fatigue and fever.  Respiratory:  Negative for chest tightness and shortness of breath.   Cardiovascular:  Negative for chest pain, palpitations and leg swelling.  Musculoskeletal:  Positive for myalgias.  Neurological:  Positive for numbness. Negative for dizziness, light-headedness and headaches.   Patient Active Problem List   Diagnosis Date Noted   Genetic testing 04/06/2020   Family history of breast cancer    Family history of throat cancer    Special screening for malignant neoplasms, colon    Family history of breast cancer in female 10/15/2019   Atypical ductal hyperplasia of left breast 08/18/2019    Transaminitis 12/31/2018   Gastroesophageal reflux disease without esophagitis 12/17/2017   Anxiety 12/17/2017   SVT (supraventricular tachycardia) (HCC) 04/04/2017   Bilateral carpal tunnel syndrome 03/20/2012    No Known Allergies  Past Surgical History:  Procedure Laterality Date   BREAST BIOPSY Left 08/11/2019   distortion, coil marker, radial scar and ADH   BREAST BIOPSY Left 10/07/2019   Procedure: BREAST BIOPSY WITH NEEDLE LOCALIZATION;  Surgeon: Campbell Lerner, MD;  Location: [facility] ORS;  Service: General;  Laterality: Left;   BREAST LUMPECTOMY Left 10/07/2019   radial scar, ADH, dense stromal fibrosis excisied, clear surgical margins   CARPAL TUNNEL RELEASE     CATARACT EXTRACTION W/PHACO Right 07/13/2020   Procedure: CATARACT EXTRACTION PHACO AND INTRAOCULAR LENS PLACEMENT (IOC) RIGHT;  Surgeon: Lockie Mola, MD;  Location: [facility];  Service: Ophthalmology;  Laterality: Right;  7.53 1:21.3 9.3%   CATARACT EXTRACTION W/PHACO Left 08/03/2020   Procedure: CATARACT EXTRACTION PHACO AND INTRAOCULAR LENS PLACEMENT (IOC) LEFT 4.52 00:44.3 10.3%;  Surgeon: Lockie Mola, MD;  Location: [facility];  Service: Ophthalmology;  Laterality: Left;   CHOLECYSTECTOMY     COLONOSCOPY WITH PROPOFOL N/A 12/07/2019   Procedure: COLONOSCOPY WITH PROPOFOL;  Surgeon: Midge Minium, MD;  Location: [facility];  Service: Endoscopy;  Laterality: N/A;   SVT ABLATION N/A 10/24/2020   Procedure: SVT ABLATION;  Surgeon: Lanier Prude, MD;  Location: [facility] INVASIVE CV LAB;  Service: Cardiovascular;  Laterality: N/A;   TUBAL LIGATION      Social History   Tobacco Use   Smoking status: Never   Smokeless tobacco: Never  Vaping Use   Vaping Use: Never used  Substance Use Topics   Alcohol use: No    Alcohol/week: 0.0 standard drinks   Drug use: No     Medication list has been reviewed and updated.  Current Meds  Medication Sig   acetaminophen (TYLENOL)  500 MG tablet Take 1,000 mg by mouth every 6 (six) hours as needed for moderate pain.    BLACK ELDERBERRY PO Take 1 tablet by mouth daily.   levocetirizine (XYZAL) 5 MG tablet Take 5 mg by mouth every evening.   Multiple Vitamins-Minerals (MULTIVITAMIN WITH MINERALS) tablet Take 1 tablet by mouth daily.   tamoxifen (NOLVADEX) 20 MG tablet Take 1 tablet by mouth once daily    PHQ 2/9 Scores 03/24/2021 09/29/2020 09/29/2019 09/26/2018  PHQ - 2 Score 0 0 0 0  PHQ- 9 Score 0 0 0 -    GAD 7 : Generalized Anxiety Score 03/24/2021 09/29/2020 09/29/2019  Nervous, Anxious, on Edge 0 0 0  Control/stop worrying 0 0 0  Worry too much - different things 0 0 0  Trouble relaxing 0 0 0  Restless 0 0 0  Easily annoyed or irritable 0 0 0  Afraid - awful might happen 0 0 0  Total GAD 7 Score 0 0 0    BP Readings from Last 3 Encounters:  03/24/21 122/84  01/18/21 108/70  11/30/20 110/72    Physical Exam Constitutional:      Appearance: Normal appearance.  Pulmonary:     Effort: Pulmonary effort is normal.  Musculoskeletal:     Cervical back: Normal range of motion.     Right lower leg: Normal. No swelling, deformity or tenderness.     Left lower leg: Tenderness (mild tenderness posterior calf) present. No swelling or deformity.  Lymphadenopathy:     Cervical: No cervical adenopathy.  Neurological:     Mental Status: She is alert.    Wt Readings from Last 3 Encounters:  03/24/21 158 lb (71.7 kg)  01/18/21 158 lb 8 oz (71.9 kg)  11/30/20 157 lb (71.2 kg)    BP 122/84   Pulse 76   Temp 98.2 F (36.8 C) (Oral)   Ht 5\' 5"  (1.651 m)   Wt 158 lb (71.7 kg)   SpO2 98%   BMI 26.29 kg/m   Assessment and Plan: 1. Muscle cramps at night Recommend Magnesium 400 mg daily Will check levels of Mg and K - Basic metabolic panel - Magnesium  2. Bilateral carpal tunnel syndrome On the left - if worsening may need to see Ortho again   Partially dictated using . Any errors  are unintentional.  Animal nutritionist, MD [facility]  03/24/2021

## 2021-03-24 NOTE — Patient Instructions (Signed)
Get Magnesium tablets and take one a day  - 400 mg  Drink less soda and more WATER

## 2021-04-13 ENCOUNTER — Telehealth: Payer: Self-pay | Admitting: Oncology

## 2021-04-13 NOTE — Telephone Encounter (Signed)
Patient agreeble to change her appointment from the 8/11 to 8/10 (MD not in clinic on thursdays).

## 2021-04-18 ENCOUNTER — Other Ambulatory Visit: Payer: Self-pay | Admitting: Oncology

## 2021-04-25 ENCOUNTER — Telehealth: Payer: Self-pay

## 2021-04-25 NOTE — Telephone Encounter (Signed)
Called pt as a reminder to get labs done. Pt verbalized understanding.   KP 

## 2021-04-28 DIAGNOSIS — R252 Cramp and spasm: Secondary | ICD-10-CM | POA: Diagnosis not present

## 2021-04-29 LAB — BASIC METABOLIC PANEL
BUN/Creatinine Ratio: 10 (ref 9–23)
BUN: 9 mg/dL (ref 6–24)
CO2: 22 mmol/L (ref 20–29)
Calcium: 8.8 mg/dL (ref 8.7–10.2)
Chloride: 102 mmol/L (ref 96–106)
Creatinine, Ser: 0.86 mg/dL (ref 0.57–1.00)
Glucose: 96 mg/dL (ref 65–99)
Potassium: 4.2 mmol/L (ref 3.5–5.2)
Sodium: 140 mmol/L (ref 134–144)
eGFR: 81 mL/min/{1.73_m2} (ref >59)

## 2021-04-29 LAB — MAGNESIUM: Magnesium: 1.9 mg/dL (ref 1.6–2.3)

## 2021-05-10 ENCOUNTER — Inpatient Hospital Stay: Payer: BC Managed Care – PPO | Attending: Oncology | Admitting: Oncology

## 2021-05-10 ENCOUNTER — Encounter: Payer: Self-pay | Admitting: Oncology

## 2021-05-10 DIAGNOSIS — Z79810 Long term (current) use of selective estrogen receptor modulators (SERMs): Secondary | ICD-10-CM | POA: Diagnosis not present

## 2021-05-10 DIAGNOSIS — Z5181 Encounter for therapeutic drug level monitoring: Secondary | ICD-10-CM

## 2021-05-10 DIAGNOSIS — N6092 Unspecified benign mammary dysplasia of left breast: Secondary | ICD-10-CM | POA: Diagnosis not present

## 2021-05-11 ENCOUNTER — Telehealth: Payer: BC Managed Care – PPO | Admitting: Oncology

## 2021-05-13 NOTE — Progress Notes (Signed)
I connected with Tammy Acevedo on 05/13/21 at  1:45 PM EDT by video enabled telemedicine visit and verified that I am speaking with the correct person using two identifiers.   I discussed the limitations, risks, security and privacy concerns of performing an evaluation and management service by telemedicine and the availability of in-person appointments. I also discussed with the patient that there may be a patient responsible charge related to this service. The patient expressed understanding and agreed to proceed.  Other persons participating in the visit and their role in the encounter:  none  Patient's location:  home Provider's location:  work  Stage manager Complaint: Routine follow-up of ADH on tamoxifen  History of present illness:  Patient is a 50 year old premenopausal female with no prior significant medical history for breast cancer or breast biopsies.  She recently underwent a screening mammogram in October 2020 with which showed possible architectural distortion in the left breast as well as a possible mass in the right breast.  Ultrasound of the right breast showed a benign cyst in the right breast.  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  Patient was seen by Dr. Claudine Mouton and she underwent a lumpectomy which showed a radial scar with florid ductal hyperplasia and sclerosing adenosis.  Normal residual atypical proliferative breast disease or evidence of DCIS or invasive malignancy.  Margins were negative   Risks and benefits of chemoprevention for atypical ductal hyperplasia but tamoxifen was discussed.  Patient started taking tamoxifen in August 2021  Interval history patient is tolerating tamoxifen well and denies any specific complaints at this time.  Denies any significant joint pain hot flashes.  She has not had menstrual cycles for over a year  now   Review of Systems  Constitutional:  Negative for chills, fever, malaise/fatigue and weight loss.  HENT:  Negative for congestion, ear discharge and nosebleeds.   Eyes:  Negative for blurred vision.  Respiratory:  Negative for cough, hemoptysis, sputum production, shortness of breath and wheezing.   Cardiovascular:  Negative for chest pain, palpitations, orthopnea and claudication.  Gastrointestinal:  Negative for abdominal pain, blood in stool, constipation, diarrhea, heartburn, melena, nausea and vomiting.  Genitourinary:  Negative for dysuria, flank pain, frequency, hematuria and urgency.  Musculoskeletal:  Negative for back pain, joint pain and myalgias.  Skin:  Negative for rash.  Neurological:  Negative for dizziness, tingling, focal weakness, seizures, weakness and headaches.  Endo/Heme/Allergies:  Does not bruise/bleed easily.  Psychiatric/Behavioral:  Negative for depression and suicidal ideas. The patient does not have insomnia.    No Known Allergies  Past Medical History:  Diagnosis Date   AVNRT (AV nodal re-entry tachycardia) (HCC)    a. 10/2020 s/p EPS and RFCA.   Chest pain    a. 08/2015 ETT: Ex time 9:01, Max HR 166, No ECG changes->Nl study.   COVID-19 08/31/2019   Family history of breast cancer    Family history of lung cancer    Family history of throat cancer    Hypertension    Mild Mitral regurgitation    a. 08/2015 Echo: EF 50%, no rwma, Triv TR/PR, mild MR; b. 10/2020 Echo: EF 60-65%, no rwma, nl RV size/fxn, no MR/MS.   Wears dentures    partial upper    Past Surgical History:  Procedure Laterality Date   BREAST BIOPSY Left 08/11/2019   distortion, coil marker, radial scar  and ADH   BREAST BIOPSY Left 10/07/2019   Procedure: BREAST BIOPSY WITH NEEDLE LOCALIZATION;  Surgeon: Campbell Lerner, MD;  Location: [facility];  Service: General;  Laterality: Left;   BREAST LUMPECTOMY Left 10/07/2019   radial scar, ADH, dense stromal fibrosis excisied, clear  surgical margins   CARPAL TUNNEL RELEASE     CATARACT EXTRACTION W/PHACO Right 07/13/2020   Procedure: CATARACT EXTRACTION PHACO AND INTRAOCULAR LENS PLACEMENT (IOC) RIGHT;  Surgeon: Lockie Mola, MD;  Location: [facility];  Service: Ophthalmology;  Laterality: Right;  7.53 1:21.3 9.3%   CATARACT EXTRACTION W/PHACO Left 08/03/2020   Procedure: CATARACT EXTRACTION PHACO AND INTRAOCULAR LENS PLACEMENT (IOC) LEFT 4.52 00:44.3 10.3%;  Surgeon: Lockie Mola, MD;  Location: [facility];  Service: Ophthalmology;  Laterality: Left;   CHOLECYSTECTOMY     COLONOSCOPY WITH PROPOFOL N/A 12/07/2019   Procedure: COLONOSCOPY WITH PROPOFOL;  Surgeon: Midge Minium, MD;  Location: [facility];  Service: Endoscopy;  Laterality: N/A;   SVT ABLATION N/A 10/24/2020   Procedure: SVT ABLATION;  Surgeon: Lanier Prude, MD;  Location: [facility];  Service: Cardiovascular;  Laterality: N/A;   TUBAL LIGATION      Social History   Socioeconomic History   Marital status: Married    Spouse name: Not on file   Number of children: 2   Years of education: Not on file   Highest education level: Not on file  Occupational History   Not on file  Tobacco Use   Smoking status: Never   Smokeless tobacco: Never  Vaping Use   Vaping Use: Never used  Substance and Sexual Activity   Alcohol use: No    Alcohol/week: 0.0 standard drinks   Drug use: No   Sexual activity: Not Currently  Other Topics Concern   Not on file  Social History Narrative   Not on file   Social Determinants of Health   Financial Resource Strain: Not on file  Food Insecurity: Not on file  Transportation Needs: Not on file  Physical Activity: Not on file  Stress: Not on file  Social Connections: Not on file  Intimate Partner Violence: Not on file    Family History  Problem Relation Age of Onset   Diabetes Mother    COPD Father    Lung cancer Father 61   Breast cancer Maternal Aunt 27        dx again at 79~metastatic   Cancer Paternal Aunt        jaw cancer dx 22s   Breast cancer Cousin        dx 50s-60s. MAT that had breast cancer's daughter   Throat cancer Cousin      Current Outpatient Medications:    acetaminophen (TYLENOL) 500 MG tablet, Take 1,000 mg by mouth every 6 (six) hours as needed for moderate pain. , Disp: , Rfl:    BLACK ELDERBERRY PO, Take 1 tablet by mouth daily., Disp: , Rfl:    levocetirizine (XYZAL) 5 MG tablet, Take 5 mg by mouth every evening., Disp: , Rfl:    Multiple Vitamins-Minerals (MULTIVITAMIN WITH MINERALS) tablet, Take 1 tablet by mouth daily., Disp: , Rfl:    tamoxifen (NOLVADEX) 20 MG tablet, Take 1 tablet by mouth once daily, Disp: 30 tablet, Rfl: 0  No results found.  No images are attached to the encounter.   CMP Latest Ref Rng & Units 04/28/2021  Glucose 65 - 99 mg/dL 96  BUN 6 - 24 mg/dL 9  Creatinine 0.57 - 1.00 mg/dL 5.05  Sodium 397 - 673 mmol/L 140  Potassium 3.5 - 5.2 mmol/L 4.2  Chloride 96 - 106 mmol/L 102  CO2 20 - 29 mmol/L 22  Calcium 8.7 - 10.2 mg/dL 8.8  Total Protein 6.5 - 8.1 g/dL -  Total Bilirubin 0.3 - 1.2 mg/dL -  Alkaline Phos 38 - 419 U/L -  AST 15 - 41 U/L -  ALT 0 - 44 U/L -   CBC Latest Ref Rng & Units 10/21/2020  WBC 4.0 - 10.5 K/uL 5.6  Hemoglobin 12.0 - 15.0 g/dL 37.9  Hematocrit 02.4 - 46.0 % 38.2  Platelets 150 - 400 K/uL 208     Observation/objective: Appears in no acute distress over video visit today.  Breathing is nonlabored  Assessment and plan:Patient is a 50 y.o. female with h/o diagnosed radial scar of the left breast with focus of atypical ductal hyperplasia s/p lumpectomy.  This is a routine follow-up visit on tamoxifen  Patient is tolerating tamoxifen well without any significant side effects and will continue to take it until 2026.  Although she is postmenopausal now I do not see a reason to switch her to an AI at this time.  She gets bilateral mammograms in November 2022  which would be coordinated by her PCP or Dr. Dalbert Garnet.  Follow-up instructions: I will see her back in 6 months for in person breast exam  I discussed the assessment and treatment plan with the patient. The patient was provided an opportunity to ask questions and all were answered. The patient agreed with the plan and demonstrated an understanding of the instructions.   The patient was advised to call back or seek an in-person evaluation if the symptoms worsen or if the condition fails to improve as anticipated.  Visit Diagnosis: 1. Encounter for monitoring tamoxifen therapy   2. Atypical ductal hyperplasia of left breast     Dr. Owens Shark, MD, MPH [address] [facility] at [facility] Tel- [phone] 05/13/2021 8:07 AM;e

## 2021-05-17 ENCOUNTER — Other Ambulatory Visit: Payer: Self-pay | Admitting: Oncology

## 2021-06-13 ENCOUNTER — Other Ambulatory Visit: Payer: Self-pay | Admitting: Obstetrics and Gynecology

## 2021-06-13 DIAGNOSIS — Z01419 Encounter for gynecological examination (general) (routine) without abnormal findings: Secondary | ICD-10-CM | POA: Diagnosis not present

## 2021-06-13 DIAGNOSIS — Z1331 Encounter for screening for depression: Secondary | ICD-10-CM | POA: Diagnosis not present

## 2021-06-13 DIAGNOSIS — Z1231 Encounter for screening mammogram for malignant neoplasm of breast: Secondary | ICD-10-CM

## 2021-06-17 ENCOUNTER — Other Ambulatory Visit: Payer: Self-pay | Admitting: Oncology

## 2021-07-23 ENCOUNTER — Other Ambulatory Visit: Payer: Self-pay | Admitting: Oncology

## 2021-08-27 ENCOUNTER — Other Ambulatory Visit: Payer: Self-pay | Admitting: Oncology

## 2021-08-31 ENCOUNTER — Ambulatory Visit
Admission: RE | Admit: 2021-08-31 | Discharge: 2021-08-31 | Disposition: A | Discharge status: Home / Self Care | Payer: BC Managed Care – PPO | Source: Ambulatory Visit | Attending: Obstetrics and Gynecology | Admitting: Obstetrics and Gynecology

## 2021-08-31 ENCOUNTER — Other Ambulatory Visit: Payer: Self-pay

## 2021-08-31 DIAGNOSIS — Z1231 Encounter for screening mammogram for malignant neoplasm of breast: Secondary | ICD-10-CM | POA: Insufficient documentation

## 2021-09-28 ENCOUNTER — Other Ambulatory Visit: Payer: Self-pay | Admitting: Oncology

## 2021-10-03 ENCOUNTER — Encounter: Payer: Self-pay | Admitting: Internal Medicine

## 2021-10-03 ENCOUNTER — Ambulatory Visit (INDEPENDENT_AMBULATORY_CARE_PROVIDER_SITE_OTHER): Payer: BC Managed Care – PPO | Admitting: Internal Medicine

## 2021-10-03 ENCOUNTER — Other Ambulatory Visit: Payer: Self-pay

## 2021-10-03 VITALS — BP 132/78 | HR 73 | Ht 65.0 in | Wt 156.4 lb

## 2021-10-03 DIAGNOSIS — I471 Supraventricular tachycardia: Secondary | ICD-10-CM

## 2021-10-03 DIAGNOSIS — E781 Pure hyperglyceridemia: Secondary | ICD-10-CM | POA: Diagnosis not present

## 2021-10-03 DIAGNOSIS — Z0000 Encounter for general adult medical examination without abnormal findings: Secondary | ICD-10-CM

## 2021-10-03 DIAGNOSIS — N6092 Unspecified benign mammary dysplasia of left breast: Secondary | ICD-10-CM | POA: Diagnosis not present

## 2021-10-03 DIAGNOSIS — K219 Gastro-esophageal reflux disease without esophagitis: Secondary | ICD-10-CM | POA: Diagnosis not present

## 2021-10-03 DIAGNOSIS — Z23 Encounter for immunization: Secondary | ICD-10-CM

## 2021-10-03 NOTE — Progress Notes (Signed)
Date:  10/03/2021   Name:  Tammy Acevedo   DOB:  10-29-1970   MRN:  [id number]   Chief Complaint: Annual Exam Tammy Acevedo is a 51 y.o. female who presents today for her Complete Annual Exam. She feels well. She reports exercising none other than work. She reports she is sleeping fairly well. Breast complaints none - sees GYN for follow up.  Mammogram: 08/2021 DEXA: none Pap smear: 06/08/2020 neg with co-testing Colonoscopy: 11/2019 repeat 10 yrs  Immunization History  Administered Date(s) Administered   Influenza,inj,Quad PF,6+ Mos 09/25/2017, 09/26/2018, 09/29/2019, 09/29/2020   Moderna Sars-Covid-2 Vaccination 11/04/2019, 12/03/2019   Tdap 12/22/2012    HPI  Lab Results  Component Value Date   NA 140 04/28/2021   K 4.2 04/28/2021   CO2 22 04/28/2021   GLUCOSE 96 04/28/2021   BUN 9 04/28/2021   CREATININE 0.86 04/28/2021   CALCIUM 8.8 04/28/2021   EGFR 81 04/28/2021   GFRNONAA >60 11/11/2020   Lab Results  Component Value Date   CHOL 150 09/29/2020   HDL 44 09/29/2020   LDLCALC 85 09/29/2020   TRIG 116 09/29/2020   CHOLHDL 3.4 09/29/2020   Lab Results  Component Value Date   TSH 1.770 09/29/2020   No results found for: HGBA1C Lab Results  Component Value Date   WBC 5.6 10/21/2020   HGB 12.8 10/21/2020   HCT 38.2 10/21/2020   MCV 87.4 10/21/2020   PLT 208 10/21/2020   Lab Results  Component Value Date   ALT 17 11/11/2020   AST 18 11/11/2020   ALKPHOS 56 11/11/2020   BILITOT 0.7 11/11/2020   No results found for: 25OHVITD2, 25OHVITD3, VD25OH   Review of Systems  Constitutional:  Negative for chills, fatigue and fever.  HENT:  Negative for congestion, hearing loss, tinnitus, trouble swallowing and voice change.   Eyes:  Negative for visual disturbance.  Respiratory:  Negative for cough, chest tightness, shortness of breath and wheezing.   Cardiovascular:  Negative for chest pain, palpitations and leg swelling.  Gastrointestinal:   Negative for abdominal pain, constipation, diarrhea and vomiting.  Endocrine: Negative for polydipsia and polyuria.  Genitourinary:  Negative for dysuria, frequency, genital sores, vaginal bleeding and vaginal discharge.  Musculoskeletal:  Negative for arthralgias, gait problem and joint swelling.  Skin:  Negative for color change and rash.  Neurological:  Negative for dizziness, tremors, light-headedness and headaches.  Hematological:  Negative for adenopathy. Does not bruise/bleed easily.  Psychiatric/Behavioral:  Negative for dysphoric mood and sleep disturbance. The patient is not nervous/anxious.    Patient Active Problem List   Diagnosis Date Noted   Genetic testing 04/06/2020   Family history of breast cancer    Family history of throat cancer    Special screening for malignant neoplasms, colon    Family history of breast cancer in female 10/15/2019   Atypical ductal hyperplasia of left breast 08/18/2019   Gastroesophageal reflux disease without esophagitis 12/17/2017   Anxiety 12/17/2017   SVT (supraventricular tachycardia) ([place]) 04/04/2017   Bilateral carpal tunnel syndrome 03/20/2012    No Known Allergies  Past Surgical History:  Procedure Laterality Date   BREAST BIOPSY Left 08/11/2019   distortion, coil marker, radial scar and ADH   BREAST BIOPSY Left 10/07/2019   Procedure: BREAST BIOPSY WITH NEEDLE LOCALIZATION;  Surgeon: Ronny Bacon, MD;  Location: [facility];  Service: General;  Laterality: Left;   BREAST LUMPECTOMY Left 10/07/2019   radial scar, ADH, dense stromal fibrosis excisied, clear  surgical margins   CARPAL TUNNEL RELEASE     CATARACT EXTRACTION W/PHACO Right 07/13/2020   Procedure: CATARACT EXTRACTION PHACO AND INTRAOCULAR LENS PLACEMENT ([place]) RIGHT;  Surgeon: Leandrew Koyanagi, MD;  Location: [place];  Service: Ophthalmology;  Laterality: Right;  7.53 1:21.3 9.3%   CATARACT EXTRACTION W/PHACO Left 08/03/2020   Procedure: CATARACT  EXTRACTION PHACO AND INTRAOCULAR LENS PLACEMENT (IOC) LEFT 4.52 00:44.3 10.3%;  Surgeon: Leandrew Koyanagi, MD;  Location: [place];  Service: Ophthalmology;  Laterality: Left;   CHOLECYSTECTOMY     COLONOSCOPY WITH PROPOFOL N/A 12/07/2019   Procedure: COLONOSCOPY WITH PROPOFOL;  Surgeon: Lucilla Lame, MD;  Location: [place];  Service: Endoscopy;  Laterality: N/A;   SVT ABLATION N/A 10/24/2020   Procedure: SVT ABLATION;  Surgeon: Vickie Epley, MD;  Location: [place] [facility];  Service: Cardiovascular;  Laterality: N/A;   TUBAL LIGATION      Social History   Tobacco Use   Smoking status: Never   Smokeless tobacco: Never  Vaping Use   Vaping Use: Never used  Substance Use Topics   Alcohol use: No    Alcohol/week: 0.0 standard drinks   Drug use: No     Medication list has been reviewed and updated.  Current Meds  Medication Sig   acetaminophen (TYLENOL) 500 MG tablet Take 1,000 mg by mouth every 6 (six) hours as needed for moderate pain.    levocetirizine (XYZAL) 5 MG tablet Take 5 mg by mouth every evening.   Multiple Vitamins-Minerals (MULTIVITAMIN WITH MINERALS) tablet Take 1 tablet by mouth daily.   tamoxifen (NOLVADEX) 20 MG tablet Take 1 tablet by mouth once daily    PHQ 2/9 Scores 10/03/2021 03/24/2021 09/29/2020 09/29/2019  PHQ - 2 Score 0 0 0 0  PHQ- 9 Score 1 0 0 0    GAD 7 : Generalized Anxiety Score 10/03/2021 03/24/2021 09/29/2020 09/29/2019  Nervous, Anxious, on Edge 0 0 0 0  Control/stop worrying 0 0 0 0  Worry too much - different things 0 0 0 0  Trouble relaxing 0 0 0 0  Restless 0 0 0 0  Easily annoyed or irritable 0 0 0 0  Afraid - awful might happen 0 0 0 0  Total GAD 7 Score 0 0 0 0  Anxiety Difficulty Not difficult at all - - -    BP Readings from Last 3 Encounters:  10/03/21 132/78  03/24/21 122/84  01/18/21 108/70    Physical Exam Vitals and nursing note reviewed.  Constitutional:      General: She is not in  acute distress.    Appearance: She is well-developed.  HENT:     Head: Normocephalic and atraumatic.     Right Ear: Tympanic membrane and ear canal normal.     Left Ear: Tympanic membrane and ear canal normal.     Nose:     Right Sinus: No maxillary sinus tenderness.     Left Sinus: No maxillary sinus tenderness.  Eyes:     General: No scleral icterus.       Right eye: No discharge.        Left eye: No discharge.     Conjunctiva/sclera: Conjunctivae normal.  Neck:     Thyroid: No thyromegaly.     Vascular: No carotid bruit.  Cardiovascular:     Rate and Rhythm: Normal rate and regular rhythm.     Pulses: Normal pulses.     Heart sounds: Normal heart sounds.  Pulmonary:  Effort: Pulmonary effort is normal. No respiratory distress.     Breath sounds: No wheezing.  Chest:  Breasts:    Right: No mass, nipple discharge, skin change or tenderness.     Left: No mass, nipple discharge, skin change or tenderness.       Comments: Healed surgical scar  Abdominal:     General: Bowel sounds are normal.     Palpations: Abdomen is soft.     Tenderness: There is no abdominal tenderness.  Musculoskeletal:     Cervical back: Normal range of motion. No erythema.     Right lower leg: No edema.     Left lower leg: No edema.  Lymphadenopathy:     Cervical: No cervical adenopathy.  Skin:    General: Skin is warm and dry.     Findings: No rash.  Neurological:     Mental Status: She is alert and oriented to person, place, and time.     Cranial Nerves: No cranial nerve deficit.     Sensory: No sensory deficit.     Deep Tendon Reflexes: Reflexes are normal and symmetric.  Psychiatric:        Attention and Perception: Attention normal.        Mood and Affect: Mood normal.    Wt Readings from Last 3 Encounters:  10/03/21 156 lb 6.4 oz (70.9 kg)  03/24/21 158 lb (71.7 kg)  01/18/21 158 lb 8 oz (71.9 kg)    BP 132/78    Pulse 73    Ht '5\' 5"'  (1.651 m)    Wt 156 lb 6.4 oz (70.9 kg)     SpO2 98%    BMI 26.03 kg/m   Assessment and Plan: 1. Annual physical exam Normal exam.  Up to date on screenings and immunizations. Flu vaccine given today. - Comprehensive metabolic panel - Lipid panel - TSH  2. SVT (supraventricular tachycardia) ([place]) Resolved after ablation.  No longer on beta blocker. Will follow up with cardiology in a few months.  3. Gastroesophageal reflux disease without esophagitis No red flag signs such as weight loss, n/v, melena Continue PRN antacids - CBC with Differential/Platelet  4. Atypical ductal hyperplasia of left breast On Tamoxifen Recent mammogram negative Followed by Oncology/GYN  5. Need for shingles vaccine First dost today - Varicella-zoster vaccine IM  6. Need for immunization against influenza - Flu Vaccine QUAD 74moIM (Fluarix, Fluzone & Alfiuria Quad PF)   Partially dictated using DEditor, commissioning Any errors are unintentional.  LHalina Maidens MD [facility]  10/03/2021

## 2021-10-04 LAB — COMPREHENSIVE METABOLIC PANEL
ALT: 12 IU/L (ref 0–32)
AST: 14 IU/L (ref 0–40)
Albumin/Globulin Ratio: 1.7 (ref 1.2–2.2)
Albumin: 4.1 g/dL (ref 3.8–4.9)
Alkaline Phosphatase: 85 IU/L (ref 44–121)
BUN/Creatinine Ratio: 11 (ref 9–23)
BUN: 9 mg/dL (ref 6–24)
Bilirubin Total: 0.2 mg/dL (ref 0.0–1.2)
CO2: 28 mmol/L (ref 20–29)
Calcium: 9.1 mg/dL (ref 8.7–10.2)
Chloride: 101 mmol/L (ref 96–106)
Creatinine, Ser: 0.8 mg/dL (ref 0.57–1.00)
Globulin, Total: 2.4 g/dL (ref 1.5–4.5)
Glucose: 91 mg/dL (ref 70–99)
Potassium: 4.2 mmol/L (ref 3.5–5.2)
Sodium: 139 mmol/L (ref 134–144)
Total Protein: 6.5 g/dL (ref 6.0–8.5)
eGFR: 89 mL/min/{1.73_m2} (ref >59)

## 2021-10-04 LAB — CBC WITH DIFFERENTIAL/PLATELET
Basophils Absolute: 0.1 10*3/uL (ref 0.0–0.2)
Basos: 1 %
EOS (ABSOLUTE): 0.3 10*3/uL (ref 0.0–0.4)
Eos: 4 %
Hematocrit: 38.5 % (ref 34.0–46.6)
Hemoglobin: 12.7 g/dL (ref 11.1–15.9)
Immature Grans (Abs): 0 10*3/uL (ref 0.0–0.1)
Immature Granulocytes: 0 %
Lymphocytes Absolute: 2.4 10*3/uL (ref 0.7–3.1)
Lymphs: 36 %
MCH: 28.5 pg (ref 26.6–33.0)
MCHC: 33 g/dL (ref 31.5–35.7)
MCV: 86 fL (ref 79–97)
Monocytes Absolute: 0.4 10*3/uL (ref 0.1–0.9)
Monocytes: 6 %
Neutrophils Absolute: 3.5 10*3/uL (ref 1.4–7.0)
Neutrophils: 53 %
Platelets: 238 10*3/uL (ref 150–450)
RBC: 4.46 x10E6/uL (ref 3.77–5.28)
RDW: 12.3 % (ref 11.7–15.4)
WBC: 6.6 10*3/uL (ref 3.4–10.8)

## 2021-10-04 LAB — LIPID PANEL
Chol/HDL Ratio: 3.4 ratio (ref 0.0–4.4)
Cholesterol, Total: 146 mg/dL (ref 100–199)
HDL: 43 mg/dL (ref >39)
LDL Chol Calc (NIH): 73 mg/dL (ref 0–99)
Triglycerides: 177 mg/dL — ABNORMAL HIGH (ref 0–149)
VLDL Cholesterol Cal: 30 mg/dL (ref 5–40)

## 2021-10-04 LAB — TSH: TSH: 1.67 u[IU]/mL (ref 0.450–4.500)

## 2021-10-26 ENCOUNTER — Other Ambulatory Visit: Payer: Self-pay | Admitting: Oncology

## 2021-11-10 ENCOUNTER — Inpatient Hospital Stay: Payer: BC Managed Care – PPO | Admitting: Oncology

## 2021-11-29 ENCOUNTER — Other Ambulatory Visit: Payer: Self-pay

## 2021-11-29 ENCOUNTER — Encounter: Payer: Self-pay | Admitting: Oncology

## 2021-11-29 ENCOUNTER — Other Ambulatory Visit: Payer: Self-pay | Admitting: Oncology

## 2021-11-29 ENCOUNTER — Inpatient Hospital Stay: Payer: BC Managed Care – PPO | Attending: Oncology | Admitting: Oncology

## 2021-11-29 VITALS — BP 116/75 | HR 67 | Temp 98.6°F | Resp 16 | Ht 65.0 in | Wt 152.0 lb

## 2021-11-29 DIAGNOSIS — Z5181 Encounter for therapeutic drug level monitoring: Secondary | ICD-10-CM

## 2021-11-29 DIAGNOSIS — N6092 Unspecified benign mammary dysplasia of left breast: Secondary | ICD-10-CM | POA: Diagnosis not present

## 2021-11-29 DIAGNOSIS — Z79810 Long term (current) use of selective estrogen receptor modulators (SERMs): Secondary | ICD-10-CM | POA: Diagnosis not present

## 2021-11-29 NOTE — Progress Notes (Signed)
Hematology/Oncology Consult note [facility]  [phone] [phone]  Patient Care Team: Reubin Milan, MD as PCP - General (Internal Medicine) Antonieta Iba, MD as PCP - Cardiology (Cardiology) Lanier Prude, MD as PCP - Electrophysiology (Cardiology) Almond Lint, MD as Referring Physician (Cardiology)   Name of the patient: Tammy Acevedo  [id number]  1970-11-20   Date of visit: 11/29/21  Diagnosis-history of ADH and tamoxifen  Chief complaint/ Reason for visit-routine follow-up of ADH  Heme/Onc history: Patient is a 51 year old premenopausal female with no prior significant medical history for breast cancer or breast biopsies.  She recently underwent a screening mammogram in October 2020 with which showed possible architectural distortion in the left breast as well as a possible mass in the right breast.  Ultrasound of the right breast showed a benign cyst in the right breast.  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  Patient was seen by Dr. Claudine Mouton and she underwent a lumpectomy which showed a radial scar with florid ductal hyperplasia and sclerosing adenosis.  Normal residual atypical proliferative breast disease or evidence of DCIS or invasive malignancy.  Margins were negative   Risks and benefits of chemoprevention for atypical ductal hyperplasia but tamoxifen was discussed.  Patient started taking tamoxifen in August 2021  Interval history-tolerating tamoxifen well without any significant side effects.  Denies any breast concerns  ECOG PS- 0 Pain scale- 0   Review of systems- Review of Systems  Constitutional:  Negative for chills, fever, malaise/fatigue and weight loss.  HENT:  Negative for congestion, ear discharge and nosebleeds.   Eyes:  Negative for blurred vision.   Respiratory:  Negative for cough, hemoptysis, sputum production, shortness of breath and wheezing.   Cardiovascular:  Negative for chest pain, palpitations, orthopnea and claudication.  Gastrointestinal:  Negative for abdominal pain, blood in stool, constipation, diarrhea, heartburn, melena, nausea and vomiting.  Genitourinary:  Negative for dysuria, flank pain, frequency, hematuria and urgency.  Musculoskeletal:  Negative for back pain, joint pain and myalgias.  Skin:  Negative for rash.  Neurological:  Negative for dizziness, tingling, focal weakness, seizures, weakness and headaches.  Endo/Heme/Allergies:  Does not bruise/bleed easily.  Psychiatric/Behavioral:  Negative for depression and suicidal ideas. The patient does not have insomnia.      No Known Allergies   Past Medical History:  Diagnosis Date   AVNRT (AV nodal re-entry tachycardia) (HCC)    a. 10/2020 s/p EPS and RFCA.   Chest pain    a. 08/2015 ETT: Ex time 9:01, Max HR 166, No ECG changes->Nl study.   COVID-19 08/31/2019   Family history of breast cancer    Family history of lung cancer    Family history of throat cancer    Hypertension    Mild Mitral regurgitation    a. 08/2015 Echo: EF 50%, no rwma, Triv TR/PR, mild MR; b. 10/2020 Echo: EF 60-65%, no rwma, nl RV size/fxn, no MR/MS.   SVT (supraventricular tachycardia) (HCC) 04/04/2017   Started On beta blocker; followed by Dr. Alvino Chapel S/p Ablation 10/2020 - off of all medications   Wears dentures    partial upper     Past Surgical History:  Procedure Laterality Date   BREAST BIOPSY Left 08/11/2019   distortion, coil marker, radial scar and ADH   BREAST BIOPSY Left 10/07/2019   Procedure: BREAST BIOPSY  WITH NEEDLE LOCALIZATION;  Surgeon: Campbell Lerner, MD;  Location: [facility] ORS;  Service: General;  Laterality: Left;   BREAST LUMPECTOMY Left 10/07/2019   radial scar, ADH, dense stromal fibrosis excisied, clear surgical margins   CARPAL TUNNEL RELEASE     CATARACT  EXTRACTION W/PHACO Right 07/13/2020   Procedure: CATARACT EXTRACTION PHACO AND INTRAOCULAR LENS PLACEMENT (IOC) RIGHT;  Surgeon: Lockie Mola, MD;  Location: [facility];  Service: Ophthalmology;  Laterality: Right;  7.53 1:21.3 9.3%   CATARACT EXTRACTION W/PHACO Left 08/03/2020   Procedure: CATARACT EXTRACTION PHACO AND INTRAOCULAR LENS PLACEMENT (IOC) LEFT 4.52 00:44.3 10.3%;  Surgeon: Lockie Mola, MD;  Location: [facility];  Service: Ophthalmology;  Laterality: Left;   CHOLECYSTECTOMY     COLONOSCOPY WITH PROPOFOL N/A 12/07/2019   Procedure: COLONOSCOPY WITH PROPOFOL;  Surgeon: Midge Minium, MD;  Location: [facility];  Service: Endoscopy;  Laterality: N/A;   SVT ABLATION N/A 10/24/2020   Procedure: SVT ABLATION;  Surgeon: Lanier Prude, MD;  Location: [facility] INVASIVE CV LAB;  Service: Cardiovascular;  Laterality: N/A;   TUBAL LIGATION      Social History   Socioeconomic History   Marital status: Married    Spouse name: Not on file   Number of children: 2   Years of education: Not on file   Highest education level: Not on file  Occupational History   Not on file  Tobacco Use   Smoking status: Never   Smokeless tobacco: Never  Vaping Use   Vaping Use: Never used  Substance and Sexual Activity   Alcohol use: No    Alcohol/week: 0.0 standard drinks   Drug use: No   Sexual activity: Not Currently  Other Topics Concern   Not on file  Social History Narrative   Not on file   Social Determinants of Health   Financial Resource Strain: Not on file  Food Insecurity: Not on file  Transportation Needs: Not on file  Physical Activity: Not on file  Stress: Not on file  Social Connections: Not on file  Intimate Partner Violence: Not on file    Family History  Problem Relation Age of Onset   Diabetes Mother    COPD Father    Lung cancer Father 36   Breast cancer Maternal Aunt 98       dx again at 79~metastatic   Cancer Paternal Aunt         jaw cancer dx 73s   Breast cancer Cousin        dx 50s-60s. MAT that had breast cancer's daughter   Throat cancer Cousin      Current Outpatient Medications:    acetaminophen (TYLENOL) 500 MG tablet, Take 1,000 mg by mouth every 6 (six) hours as needed for moderate pain. , Disp: , Rfl:    levocetirizine (XYZAL) 5 MG tablet, Take 5 mg by mouth every evening., Disp: , Rfl:    Multiple Vitamins-Minerals (MULTIVITAMIN WITH MINERALS) tablet, Take 1 tablet by mouth daily., Disp: , Rfl:    tamoxifen (NOLVADEX) 20 MG tablet, Take 1 tablet by mouth once daily, Disp: 30 tablet, Rfl: 0  Physical exam:  Vitals:   11/29/21 0952  BP: 116/75  Pulse: 67  Resp: 16  Temp: 98.6 F (37 C)  TempSrc: Tympanic  SpO2: 100%  Weight: 152 lb (68.9 kg)  Height: 5\' 5"  (1.651 m)   Physical Exam Constitutional:      General: She is not in acute distress. Cardiovascular:  Rate and Rhythm: Normal rate and regular rhythm.     Heart sounds: Normal heart sounds.  Pulmonary:     Effort: Pulmonary effort is normal.     Breath sounds: Normal breath sounds.  Abdominal:     General: Bowel sounds are normal.     Palpations: Abdomen is soft.  Skin:    General: Skin is warm and dry.  Neurological:     Mental Status: She is alert and oriented to person, place, and time.  Breast exam: No palpable masses in either breast.  No palpable bilateral axillary adenopathy.  CMP Latest Ref Rng & Units 10/03/2021  Glucose 70 - 99 mg/dL 91  BUN 6 - 24 mg/dL 9  Creatinine 5.49 - 8.26 mg/dL 4.15  Sodium 830 - 940 mmol/L 139  Potassium 3.5 - 5.2 mmol/L 4.2  Chloride 96 - 106 mmol/L 101  CO2 20 - 29 mmol/L 28  Calcium 8.7 - 10.2 mg/dL 9.1  Total Protein 6.0 - 8.5 g/dL 6.5  Total Bilirubin 0.0 - 1.2 mg/dL 0.2  Alkaline Phos 44 - 121 IU/L 85  AST 0 - 40 IU/L 14  ALT 0 - 32 IU/L 12   CBC Latest Ref Rng & Units 10/03/2021  WBC 3.4 - 10.8 x10E3/uL 6.6  Hemoglobin 11.1 - 15.9 g/dL 76.8  Hematocrit 08.8 - 46.6 % 38.5   Platelets 150 - 450 x10E3/uL 238     Assessment and plan- Patient is a 51 y.o. female with history of ADH on tamoxifen here for routine follow-up  Patient is on tamoxifen for chemoprevention of atypical ductal hyperplasia which was started in August 2021 and will continue apparently 2026.  She is tolerating it well without any significant side effects.  I will see her back in 6 months.  She did have a mammogram from December 2022 which was unremarkable.   Visit Diagnosis 1. Encounter for monitoring tamoxifen therapy   2. Atypical ductal hyperplasia of left breast      Dr. Owens Shark, MD, MPH [facility] [phone] 11/29/2021 11:37 AM

## 2022-01-01 ENCOUNTER — Ambulatory Visit: Payer: BC Managed Care – PPO

## 2022-01-01 ENCOUNTER — Other Ambulatory Visit: Payer: Self-pay

## 2022-01-01 DIAGNOSIS — Z23 Encounter for immunization: Secondary | ICD-10-CM

## 2022-01-02 ENCOUNTER — Encounter: Payer: Self-pay | Admitting: Internal Medicine

## 2022-01-26 ENCOUNTER — Ambulatory Visit (INDEPENDENT_AMBULATORY_CARE_PROVIDER_SITE_OTHER): Payer: BC Managed Care – PPO

## 2022-01-26 ENCOUNTER — Ambulatory Visit (INDEPENDENT_AMBULATORY_CARE_PROVIDER_SITE_OTHER): Payer: BC Managed Care – PPO | Admitting: Nurse Practitioner

## 2022-01-26 ENCOUNTER — Encounter: Payer: Self-pay | Admitting: Nurse Practitioner

## 2022-01-26 VITALS — BP 100/60 | HR 72 | Ht 65.0 in | Wt 150.2 lb

## 2022-01-26 DIAGNOSIS — R55 Syncope and collapse: Secondary | ICD-10-CM | POA: Diagnosis not present

## 2022-01-26 DIAGNOSIS — I471 Supraventricular tachycardia: Secondary | ICD-10-CM

## 2022-01-26 DIAGNOSIS — R002 Palpitations: Secondary | ICD-10-CM

## 2022-01-26 NOTE — Progress Notes (Signed)
? ? ?Office Visit  ?  ?Patient Name: Tammy Acevedo ?Date of Encounter: 01/26/2022 ? ?Primary Care Provider:  Reubin Milan, MD ?Primary Cardiologist:  Tammy Nordmann, MD ? ?Chief Complaint  ?  ?51 year old female with a history of PSVT/AVNRT status post catheter ablation in 2022, chest pain, hypertension, and mild mitral regurgitation, who presents for follow-up related to palpitations and presyncope. ? ?Past Medical History  ?  ?Past Medical History:  ?Diagnosis Date  ? AVNRT (AV nodal re-entry tachycardia) (HCC)   ? a. 10/2020 s/p EPS and RFCA.  ? Chest pain   ? a. 08/2015 ETT: Ex time 9:01, Max HR 166, No ECG changes->Nl study.  ? COVID-19 08/31/2019  ? Family history of breast cancer   ? Family history of lung cancer   ? Family history of throat cancer   ? Hypertension   ? Mild Mitral regurgitation   ? a. 08/2015 Echo: EF 50%, no rwma, Triv TR/PR, mild MR; b. 10/2020 Echo: EF 60-65%, no rwma, nl RV size/fxn, no MR/MS.  ? SVT (supraventricular tachycardia) (HCC) 04/04/2017  ? Started On beta blocker; followed by Dr. Alvino Acevedo S/p Ablation 10/2020 - off of all medications  ? Wears dentures   ? partial upper  ? ?Past Surgical History:  ?Procedure Laterality Date  ? BREAST BIOPSY Left 08/11/2019  ? distortion, coil marker, radial scar and ADH  ? BREAST BIOPSY Left 10/07/2019  ? Procedure: BREAST BIOPSY WITH NEEDLE LOCALIZATION;  Surgeon: Tammy Lerner, MD;  Location: [facility] ORS;  Service: General;  Laterality: Left;  ? BREAST LUMPECTOMY Left 10/07/2019  ? radial scar, ADH, dense stromal fibrosis excisied, clear surgical margins  ? CARPAL TUNNEL RELEASE    ? CATARACT EXTRACTION W/PHACO Right 07/13/2020  ? Procedure: CATARACT EXTRACTION PHACO AND INTRAOCULAR LENS PLACEMENT (IOC) RIGHT;  Surgeon: Tammy Mola, MD;  Location: [facility];  Service: Ophthalmology;  Laterality: Right;  7.53 ?1:21.3 ?9.3%  ? CATARACT EXTRACTION W/PHACO Left 08/03/2020  ? Procedure: CATARACT EXTRACTION PHACO AND  INTRAOCULAR LENS PLACEMENT (IOC) LEFT 4.52 00:44.3 10.3%;  Surgeon: Tammy Mola, MD;  Location: [facility];  Service: Ophthalmology;  Laterality: Left;  ? CHOLECYSTECTOMY    ? COLONOSCOPY WITH PROPOFOL N/A 12/07/2019  ? Procedure: COLONOSCOPY WITH PROPOFOL;  Surgeon: Tammy Minium, MD;  Location: [facility];  Service: Endoscopy;  Laterality: N/A;  ? SVT ABLATION N/A 10/24/2020  ? Procedure: SVT ABLATION;  Surgeon: Tammy Prude, MD;  Location: [facility] INVASIVE CV LAB;  Service: Cardiovascular;  Laterality: N/A;  ? TUBAL LIGATION    ? ? ?Allergies ? ?No Known Allergies ? ?History of Present Illness  ?  ?51 year old female with above past medical history including SVT/AVNRT, chest pain, hypertension, and mild mitral regurgitation.  She was previously evaluated at [facility] clinic in 2016, in the setting of chest pain, with stress testing showing good exercise tolerance and no acute ST or T changes.  Echocardiogram at that time showed an EF of 50% without regional wall motion abnormalities.  Mild mitral regurgitation was noted.  In January 2021, she underwent lumpectomy of the left breast in the setting of focal atypical ductal hyperplasia.  She has since been followed by oncology and remains on tamoxifen therapy.  Throughout the fall 2021, she noted increasing palpitations and documentation of SVT.  She was referred to electrophysiology and subsequently underwent catheter ablation Tammy Acevedo) in January 2022.  During the procedure, there was a nonclinical atrial tachycardia noted, which was not targeted for ablation. ? ?Ms.  Acevedo was last cardiology clinic in April 2022, at which time she was doing well.  Over the past year, she has mostly done well with infrequent brief episodes of palpitations, noting that up until last night, she cannot remember the last time she had an episode.  Last night however, she had just finished eating dinner and was speaking with a coworker when she had sudden onset  of lightheadedness which became associated with fluttering type palpitations.  She sat down and symptoms resolved within 5 minutes.  No recurrence last night or this morning.  She otherwise has remained active over the past year without chest pain or dyspnea.  She denies PND, orthopnea, dizziness, syncope, edema, or early satiety. ? ?Home Medications  ?  ?Current Outpatient Medications  ?Medication Sig Dispense Refill  ? acetaminophen (TYLENOL) 500 MG tablet Take 1,000 mg by mouth every 6 (six) hours as needed for moderate pain.     ? levocetirizine (XYZAL) 5 MG tablet Take 5 mg by mouth every evening.    ? Multiple Vitamins-Minerals (MULTIVITAMIN WITH MINERALS) tablet Take 1 tablet by mouth daily.    ? tamoxifen (NOLVADEX) 20 MG tablet Take 1 tablet by mouth once daily 90 tablet 1  ? ?No current facility-administered medications for this visit.  ?  ? ?Review of Systems  ?  ?Palpitations and presyncope last night.  She denies chest pain, dyspnea, PND, orthopnea, syncope, edema, or early satiety.  All other systems reviewed and are otherwise negative except as noted above. ?  ? ?Physical Exam  ?  ?VS:  BP 100/60 (BP Location: Left Arm, Patient Position: Sitting, Cuff Size: Normal)   Pulse 72   Ht 5\' 5"  (1.651 m)   Wt 150 lb 4 oz (68.2 kg)   SpO2 98%   BMI 25.00 kg/m?  , BMI Body mass index is 25 kg/m?. ?    ?Blood pressure lying 116/76, heart rate 74 ?Blood pressure sitting 106/70, heart rate 85 ?Blood pressure standing (0-minute) 110/75, heart rate 82 ?Blood pressure standing (3 minutes) 107/74, heart rate 84 ? ?GEN: Well nourished, well developed, in no acute distress. ?HEENT: normal. ?Neck: Supple, no JVD, carotid bruits, or masses. ?Cardiac: RRR, no murmurs, rubs, or gallops. No clubbing, cyanosis, edema.  Radials/PT 2+ and equal bilaterally.  ?Respiratory:  Respirations regular and unlabored, clear to auscultation bilaterally. ?GI: Soft, nontender, nondistended, BS + x 4. ?MS: no deformity or  atrophy. ?Skin: warm and dry, no rash. ?Neuro:  Strength and sensation are intact. ?Psych: Normal affect. ? ?Accessory Clinical Findings  ?  ?ECG personally reviewed by me today -regular sinus rhythm, 72- no acute changes. ? ?Lab Results  ?Component Value Date  ? WBC 6.6 10/03/2021  ? HGB 12.7 10/03/2021  ? HCT 38.5 10/03/2021  ? MCV 86 10/03/2021  ? PLT 238 10/03/2021  ? ?Lab Results  ?Component Value Date  ? CREATININE 0.80 10/03/2021  ? BUN 9 10/03/2021  ? NA 139 10/03/2021  ? K 4.2 10/03/2021  ? CL 101 10/03/2021  ? CO2 28 10/03/2021  ? ?Lab Results  ?Component Value Date  ? ALT 12 10/03/2021  ? AST 14 10/03/2021  ? ALKPHOS 85 10/03/2021  ? BILITOT 0.2 10/03/2021  ? ?Lab Results  ?Component Value Date  ? CHOL 146 10/03/2021  ? HDL 43 10/03/2021  ? LDLCALC 73 10/03/2021  ? TRIG 177 (H) 10/03/2021  ? CHOLHDL 3.4 10/03/2021  ?  ? ?Assessment & Plan  ?  ?1.  Palpitations/presyncope: Patient with prior  history of AVNRT status post catheter ablation in January 2022, who had been doing well over the past year but then last night, had an episode of presyncope with fluttering type palpitations lasting about 5 minutes, resolving spontaneously.  ECG is normal this morning.  Orthostatic vital signs unremarkable.  We will place a ZIO AT. ? ?2.  AVNRT: Status post catheter ablation in January 2022.  No sustained tachyarrhythmias that she is aware of over the past year.  Monitoring planned as outlined above in the setting of fluttering and presyncope yesterday. ? ?3.  Disposition: Follow-up event monitoring.  Follow-up in clinic in 6 weeks or sooner if necessary. ? ? ?Nicolasa Duckinghristopher Chen Saadeh, NP ?01/26/2022, 9:52 AM ? ?

## 2022-01-26 NOTE — Patient Instructions (Signed)
Medication Instructions:  ?No changes at this time.  ? ?*If you need a refill on your cardiac medications before your next appointment, please call your pharmacy* ? ? ?Lab Work: ?None ? ?If you have labs (blood work) drawn today and your tests are completely normal, you will receive your results only by: ?MyChart Message (if you have MyChart) OR ?A paper copy in the mail ?If you have any lab test that is abnormal or we need to change your treatment, we will call you to review the results. ? ? ?Testing/Procedures: ?Your provider has ordered a heart monitor to wear for 14 days. This will be mailed to your home with instructions on placement. Once you have finished the time frame requested, you will return monitor in box provided. ? ? ? ? ? ?Follow-Up: ?At [facility], you and your health needs are our priority.  As part of our continuing mission to provide you with exceptional heart care, we have created designated Provider Care Teams.  These Care Teams include your primary Cardiologist (physician) and Advanced Practice Providers (APPs -  Physician Assistants and Nurse Practitioners) who all work together to provide you with the care you need, when you need it. ? ? ?Your next appointment:   ?6 week(s) ? ?The format for your next appointment:   ?In Person ? ?Provider:   ?Julien Nordmann, MD or Nicolasa Ducking, NP  ? ? ? ? ? ?Important Information About Sugar ? ? ? ? ?  ?

## 2022-02-05 DIAGNOSIS — I471 Supraventricular tachycardia: Secondary | ICD-10-CM

## 2022-02-06 DIAGNOSIS — I471 Supraventricular tachycardia: Secondary | ICD-10-CM | POA: Diagnosis not present

## 2022-02-08 ENCOUNTER — Telehealth: Payer: Self-pay | Admitting: Cardiovascular Disease

## 2022-02-08 NOTE — Telephone Encounter (Signed)
Called to say they havent been able to get in contact with the patient to go over her out pocket cost as well as the device it self. Just calling to let our office know. Please advise  ?

## 2022-02-08 NOTE — Telephone Encounter (Signed)
Reviewed monitor company had been reaching out to her regarding monitor. She came by office Monday and they assisted with placing that for her. She is currently wearing the monitor and reports no problems or concerns. Instructed her to call back if she should have any questions. She verbalized understanding. ? ?Attempted to call company back to review information but unable to reach anyone.  ?

## 2022-02-27 ENCOUNTER — Telehealth: Payer: Self-pay | Admitting: *Deleted

## 2022-02-27 DIAGNOSIS — I471 Supraventricular tachycardia: Secondary | ICD-10-CM

## 2022-02-27 DIAGNOSIS — R002 Palpitations: Secondary | ICD-10-CM

## 2022-02-27 NOTE — Telephone Encounter (Signed)
Left voicemail message to call back for review of results and recommendations.  

## 2022-02-27 NOTE — Telephone Encounter (Signed)
-----   Message from Creig Hines, NP sent at 02/27/2022  1:31 PM EDT ----- Predominantly sinus rhythm.  2 brief runs of fast beats from the bottom chambers and 27 brief runs of fast beats from the top chambers.  She had occasional isolated extra beats from the top chambers as well.  No triggered events.  With the runs of extra beats from the bottom chambers, I recommend repeating an echocardiogram to ensure that there's been no change in heart squeezing function. If blood pressure allows, she would also benefit from adding metoprolol 12.5mg  twice daily.

## 2022-03-07 MED ORDER — METOPROLOL TARTRATE 25 MG PO TABS: 12.5000 mg | ORAL_TABLET | Freq: Two times a day (BID) | ORAL | 3 refills | Status: DC

## 2022-03-07 NOTE — Telephone Encounter (Signed)
Reviewed results and recommendations with patient. She was agreeable with plan. Confirmed upcoming appointment next week and order placed for echocardiogram. Advised that someone would call from scheduling to set that up with her. She verbalized understanding of our conversation with no further questions at this time.

## 2022-03-12 NOTE — Progress Notes (Unsigned)
Date:  03/14/2022   ID:  Tammy Acevedo, DOB Nov 06, 1970, MRN [id number]  Patient Location:  [place] [id number]   Provider location:   [facility] [place], [place] office  PCP:  Glean Hess, MD  Cardiologist: Rockey Situ  Chief Complaint  Patient presents with   6 week follow up     Patient c/o fluttering in chest at times and had a spell of dizziness. Medications reviewed by the patient verbally.     History of Present Illness:    Tammy Acevedo is a 51 y.o. female  past medical history of  SVT several years ago, 2017 Catheter ablation January 2022 Atrial tachycardia Was coming on every 3 to 6 month, better on metoprolol Who presents for routine follow-up of her SVT   Works at group home, [place] seen by myself by telemetry visit April 2020 Seen by one of our providers April 2023  January 25, 2022, near syncope/lightheadedness with some palpitations Symptoms resolved after 5 minutes Zio monitor placed, normal sinus rhythm Rare SVT longest 11 beats  Last Friday, in the Am got dizzy, lasted a minute, sat down on the bed and symptoms resolved Other wise doing well No regular exercise program  Previously reported GERD symptoms, daily Off omeprazole, takes Tums  Works in group home, stress, 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  Lab work reviewed Total chol 146, LDL 73 TSH normal AST and ALT 14, 12, transaminitis resolved  EKG personally reviewed by myself on todays visit Normal sinus rhythm with rate 63 bpm no significant ST-T wave changes  Prior CV studies:   The following studies were reviewed today:  Other past medical history reviewed Echocardiogram 08/05/2015: [facility] NORMAL LEFT VENTRICULAR SYSTOLIC FUNCTION WITH AN ESTIMATED EF = 50  % NORMAL RIGHT VENTRICULAR SYSTOLIC FUNCTION MILD VALVULAR REGURGITATION (See above) mitral  NO VALVULAR STENOSIS   Exercise tolerance test 08/05/2015, Dr. Clayborn Bigness: Normal exercise stress test    Past Medical History:  Diagnosis Date   AVNRT (AV nodal re-entry tachycardia) ([place])    a. 10/2020 s/p EPS and RFCA.   Chest pain    a. 08/2015 ETT: Ex time 9:01, Max HR 166, No ECG changes->Nl study.   COVID-19 08/31/2019   Family history of breast cancer    Family history of lung cancer    Family history of throat cancer    Hypertension    Mild Mitral regurgitation    a. 08/2015 Echo: EF 50%, no rwma, Triv TR/PR, mild MR; b. 10/2020 Echo: EF 60-65%, no rwma, nl RV size/fxn, no MR/MS.   SVT (supraventricular tachycardia) ([place]) 04/04/2017   Started On beta blocker; followed by Dr. Yvone Neu S/p Ablation 10/2020 - off of all medications   Wears dentures    partial upper   Past Surgical History:  Procedure Laterality Date   BREAST BIOPSY Left 08/11/2019   distortion, coil marker, radial scar and ADH   BREAST BIOPSY Left 10/07/2019   Procedure: BREAST BIOPSY WITH NEEDLE LOCALIZATION;  Surgeon: Ronny Bacon, MD;  Location: [facility];  Service: General;  Laterality: Left;   BREAST LUMPECTOMY Left 10/07/2019   radial scar, ADH, dense stromal fibrosis excisied, clear surgical margins   CARPAL TUNNEL RELEASE     CATARACT EXTRACTION W/PHACO Right 07/13/2020  Procedure: CATARACT EXTRACTION PHACO AND INTRAOCULAR LENS PLACEMENT ([place]) RIGHT;  Surgeon: Leandrew Koyanagi, MD;  Location: [place];  Service: Ophthalmology;  Laterality: Right;  7.53 1:21.3 9.3%   CATARACT EXTRACTION W/PHACO Left 08/03/2020   Procedure: CATARACT EXTRACTION PHACO AND INTRAOCULAR LENS PLACEMENT (IOC) LEFT 4.52 00:44.3 10.3%;  Surgeon: Leandrew Koyanagi, MD;  Location: [place];  Service: Ophthalmology;  Laterality: Left;   CHOLECYSTECTOMY     COLONOSCOPY WITH PROPOFOL N/A 12/07/2019   Procedure:  COLONOSCOPY WITH PROPOFOL;  Surgeon: Lucilla Lame, MD;  Location: [place];  Service: Endoscopy;  Laterality: N/A;   SVT ABLATION N/A 10/24/2020   Procedure: SVT ABLATION;  Surgeon: Vickie Epley, MD;  Location: [place] [facility];  Service: Cardiovascular;  Laterality: N/A;   TUBAL LIGATION       Current Meds  Medication Sig   acetaminophen (TYLENOL) 500 MG tablet Take 1,000 mg by mouth every 6 (six) hours as needed for moderate pain.    levocetirizine (XYZAL) 5 MG tablet Take 5 mg by mouth every evening.   metoprolol tartrate (LOPRESSOR) 25 MG tablet Take 0.5 tablets (12.5 mg total) by mouth 2 (two) times daily.   Multiple Vitamins-Minerals (MULTIVITAMIN WITH MINERALS) tablet Take 1 tablet by mouth daily.   tamoxifen (NOLVADEX) 20 MG tablet Take 1 tablet by mouth once daily     Allergies:   Patient has no known allergies.   Social History   Tobacco Use   Smoking status: Never   Smokeless tobacco: Never  Vaping Use   Vaping Use: Never used  Substance Use Topics   Alcohol use: No    Alcohol/week: 0.0 standard drinks of alcohol   Drug use: No     Current Outpatient Medications on File Prior to Visit  Medication Sig Dispense Refill   acetaminophen (TYLENOL) 500 MG tablet Take 1,000 mg by mouth every 6 (six) hours as needed for moderate pain.      levocetirizine (XYZAL) 5 MG tablet Take 5 mg by mouth every evening.     metoprolol tartrate (LOPRESSOR) 25 MG tablet Take 0.5 tablets (12.5 mg total) by mouth 2 (two) times daily. 45 tablet 3   Multiple Vitamins-Minerals (MULTIVITAMIN WITH MINERALS) tablet Take 1 tablet by mouth daily.     tamoxifen (NOLVADEX) 20 MG tablet Take 1 tablet by mouth once daily 90 tablet 1   No current facility-administered medications on file prior to visit.     Family Hx: The patient's family history includes Breast cancer in her cousin; Breast cancer (age of onset: 47) in her maternal aunt; COPD in her father; Cancer in her paternal  aunt; Diabetes in her mother; Lung cancer (age of onset: 10) in her father; Throat cancer in her cousin.  ROS:   Please see the history of present illness.    Review of Systems  Constitutional: Negative.   Respiratory: Negative.    Cardiovascular:  Positive for palpitations.       Paroxysmal tachycardia  Gastrointestinal: Negative.   Musculoskeletal: Negative.   Neurological: Negative.   Psychiatric/Behavioral: Negative.    All other systems reviewed and are negative.    Labs/Other Tests and Data Reviewed:    Recent Labs: 04/28/2021: Magnesium 1.9 10/03/2021: ALT 12; BUN 9; Creatinine, Ser 0.80; Hemoglobin 12.7; Platelets 238; Potassium 4.2; Sodium 139; TSH 1.670   Recent Lipid Panel Lab Results  Component Value Date/Time   CHOL 146 10/03/2021 10:42 AM   TRIG 177 (H) 10/03/2021 10:42 AM   HDL 43  10/03/2021 10:42 AM   CHOLHDL 3.4 10/03/2021 10:42 AM   CHOLHDL 4 12/15/2012 08:57 AM   LDLCALC 73 10/03/2021 10:42 AM    Wt Readings from Last 3 Encounters:  03/14/22 156 lb (70.8 kg)  01/26/22 150 lb 4 oz (68.2 kg)  11/29/21 152 lb (68.9 kg)     Exam:   BP 110/64 (BP Location: Left Arm, Patient Position: Sitting, Cuff Size: Normal)   Pulse 63   Ht 5\' 5"  (1.651 m)   Wt 156 lb (70.8 kg)   SpO2 98%   BMI 25.96 kg/m  Constitutional:  oriented to person, place, and time. No distress.  HENT:  Head: Grossly normal Eyes:  no discharge. No scleral icterus.  Neck: No JVD, no carotid bruits  Cardiovascular: Regular rate and rhythm, no murmurs appreciated Pulmonary/Chest: Clear to auscultation bilaterally, no wheezes or rails Abdominal: Soft.  no distension.  no tenderness.  Musculoskeletal: Normal range of motion Neurological:  normal muscle tone. Coordination normal. No atrophy Skin: Skin warm and dry Psychiatric: normal affect, pleasant   ASSESSMENT & PLAN:    SVT (supraventricular tachycardia) (HCC) Rare episodes tachycardia, in general has been well controlled on  metoprolol twice a day No changes to the medications, blood pressure running borderline low, occasional dizziness For any worsening of her symptoms could consider use antiarrhythmics versus ablation  Gastroesophageal reflux disease without esophagitis Feels her symptoms are under control Previously on omeprazole, not on her medication list at this time  Anxiety Stable, managed by primary care Long work hours  Transaminitis Labs reviewed, elevated ALT and AST in the past, now resolved   Total encounter time more than 30 minutes  Greater than 50% was spent in counseling and coordination of care with the patient    Signed, Ida Rogue, MD  03/14/2022 9:31 AM    [place] Office [address], [place], [place] [address]

## 2022-03-14 ENCOUNTER — Encounter: Payer: Self-pay | Admitting: Cardiovascular Disease

## 2022-03-14 ENCOUNTER — Ambulatory Visit (INDEPENDENT_AMBULATORY_CARE_PROVIDER_SITE_OTHER): Payer: BC Managed Care – PPO | Admitting: Cardiovascular Disease

## 2022-03-14 VITALS — BP 110/64 | HR 63 | Ht 65.0 in | Wt 156.0 lb

## 2022-03-14 DIAGNOSIS — K219 Gastro-esophageal reflux disease without esophagitis: Secondary | ICD-10-CM | POA: Diagnosis not present

## 2022-03-14 DIAGNOSIS — I10 Essential (primary) hypertension: Secondary | ICD-10-CM | POA: Diagnosis not present

## 2022-03-14 DIAGNOSIS — R55 Syncope and collapse: Secondary | ICD-10-CM | POA: Diagnosis not present

## 2022-03-14 DIAGNOSIS — I471 Supraventricular tachycardia: Secondary | ICD-10-CM | POA: Diagnosis not present

## 2022-03-14 MED ORDER — METOPROLOL TARTRATE 25 MG PO TABS: 12.5000 mg | ORAL_TABLET | Freq: Two times a day (BID) | ORAL | 3 refills | Status: DC

## 2022-03-14 NOTE — Patient Instructions (Signed)
Medication Instructions:  No changes  If you need a refill on your cardiac medications before your next appointment, please call your pharmacy.   Lab work: No new labs needed  Testing/Procedures: No new testing needed  Follow-Up: At [facility], you and your health needs are our priority.  As part of our continuing mission to provide you with exceptional heart care, we have created designated Provider Care Teams.  These Care Teams include your primary Cardiologist (physician) and Advanced Practice Providers (APPs -  Physician Assistants and Nurse Practitioners) who all work together to provide you with the care you need, when you need it.  You will need a follow up appointment in 12 months  Providers on your designated Care Team:   Christopher Berge, NP Ryan Dunn, PA-C Cadence Furth, PA-C  COVID-19 Vaccine Information can be found at: [email or web address] For questions related to vaccine distribution or appointments, please email [email or web address] or call [phone].   

## 2022-03-15 NOTE — Addendum Note (Signed)
Addended by: Festus Aloe on: 03/15/2022 02:42 PM   Modules accepted: Orders

## 2022-03-21 ENCOUNTER — Ambulatory Visit (INDEPENDENT_AMBULATORY_CARE_PROVIDER_SITE_OTHER): Payer: BC Managed Care – PPO

## 2022-03-21 DIAGNOSIS — R002 Palpitations: Secondary | ICD-10-CM

## 2022-03-21 DIAGNOSIS — I471 Supraventricular tachycardia: Secondary | ICD-10-CM | POA: Diagnosis not present

## 2022-03-21 LAB — ECHOCARDIOGRAM COMPLETE
AR max vel: 2.09 cm2
AV Area VTI: 1.96 cm2
AV Area mean vel: 2.08 cm2
AV Mean grad: 4 mmHg
AV Peak grad: 7.2 mmHg
Ao pk vel: 1.34 m/s
Area-P 1/2: 5.2 cm2
Calc EF: 57.9 %
S' Lateral: 2.9 cm
Single Plane A2C EF: 57.6 %
Single Plane A4C EF: 56.1 %

## 2022-06-06 ENCOUNTER — Inpatient Hospital Stay: Payer: Self-pay | Admitting: Medical Oncology

## 2022-06-14 ENCOUNTER — Inpatient Hospital Stay: Payer: Managed Care, Other (non HMO) | Attending: Oncology | Admitting: Medical Oncology

## 2022-06-14 ENCOUNTER — Encounter: Payer: Self-pay | Admitting: Medical Oncology

## 2022-06-14 VITALS — BP 114/76 | HR 69 | Wt 162.0 lb

## 2022-06-14 DIAGNOSIS — N6092 Unspecified benign mammary dysplasia of left breast: Secondary | ICD-10-CM | POA: Diagnosis not present

## 2022-06-14 DIAGNOSIS — Z79810 Long term (current) use of selective estrogen receptor modulators (SERMs): Secondary | ICD-10-CM | POA: Insufficient documentation

## 2022-06-14 DIAGNOSIS — Z5181 Encounter for therapeutic drug level monitoring: Secondary | ICD-10-CM

## 2022-06-14 NOTE — Progress Notes (Signed)
Hematology/Oncology Consult note [facility]  [phone] [phone]  Patient Care Team: Reubin Milan, MD as PCP - General (Internal Medicine) Antonieta Iba, MD as PCP - Cardiology (Cardiology) Lanier Prude, MD as PCP - Electrophysiology (Cardiology) Almond Lint, MD as Referring Physician (Cardiology)   Name of the patient: Tammy Acevedo  [id number]  1971/02/16   Date of visit: 06/14/22  Diagnosis-history of ADH and tamoxifen  Chief complaint/ Reason for visit-routine follow-up of ADH  Heme/Onc history: Patient is a 51 year old premenopausal female with no prior significant medical history for breast cancer or breast biopsies.  She recently underwent a screening mammogram in October 2020 with which showed possible architectural distortion in the left breast as well as a possible mass in the right breast.  Ultrasound of the right breast showed a benign cyst in the right breast.  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  Patient was seen by Dr. Claudine Mouton and she underwent a lumpectomy which showed a radial scar with florid ductal hyperplasia and sclerosing adenosis.  Normal residual atypical proliferative breast disease or evidence of DCIS or invasive malignancy.  Margins were negative   Risks and benefits of chemoprevention for atypical ductal hyperplasia but tamoxifen was discussed.  Patient started taking tamoxifen in August 2021  Interval history- Today she reports that she is doing well. She has no concerns or side effects from the Tamoxifen. She denies chest pain, SOB, breast changed/discharge, sexual health concerns. Last mammogram was in Dec 2022. Seen by Dr. Dalbert Garnet in OB-GYN.   ECOG PS- 0 Pain scale- 0   Review of systems- Review of Systems  Constitutional:  Negative for chills,  fever, malaise/fatigue and weight loss.  HENT:  Negative for congestion, ear discharge and nosebleeds.   Eyes:  Negative for blurred vision.  Respiratory:  Negative for cough, hemoptysis, sputum production, shortness of breath and wheezing.   Cardiovascular:  Negative for chest pain, palpitations, orthopnea and claudication.  Gastrointestinal:  Negative for abdominal pain, blood in stool, constipation, diarrhea, heartburn, melena, nausea and vomiting.  Genitourinary:  Negative for dysuria, flank pain, frequency, hematuria and urgency.  Musculoskeletal:  Negative for back pain, joint pain and myalgias.  Skin:  Negative for rash.  Neurological:  Negative for dizziness, tingling, focal weakness, seizures, weakness and headaches.  Endo/Heme/Allergies:  Does not bruise/bleed easily.  Psychiatric/Behavioral:  Negative for depression and suicidal ideas. The patient does not have insomnia.       No Known Allergies   Past Medical History:  Diagnosis Date   AVNRT (AV nodal re-entry tachycardia) (HCC)    a. 10/2020 s/p EPS and RFCA.   Chest pain    a. 08/2015 ETT: Ex time 9:01, Max HR 166, No ECG changes->Nl study.   COVID-19 08/31/2019   Family history of breast cancer    Family history of lung cancer    Family history of throat cancer    Hypertension    Mild Mitral regurgitation    a. 08/2015 Echo: EF 50%, no rwma, Triv TR/PR, mild MR; b. 10/2020 Echo: EF 60-65%, no rwma, nl RV size/fxn, no MR/MS.   SVT (supraventricular tachycardia) (HCC) 04/04/2017   Started On beta blocker; followed by Dr. Alvino Chapel S/p Ablation 10/2020 - off of all medications   Wears dentures    partial upper     Past Surgical History:  Procedure Laterality Date   BREAST BIOPSY Left 08/11/2019   distortion, coil marker, radial scar and ADH   BREAST BIOPSY Left 10/07/2019   Procedure: BREAST BIOPSY WITH NEEDLE LOCALIZATION;  Surgeon: Campbell Lerner, MD;  Location: [facility];  Service: General;  Laterality: Left;   BREAST  LUMPECTOMY Left 10/07/2019   radial scar, ADH, dense stromal fibrosis excisied, clear surgical margins   CARPAL TUNNEL RELEASE     CATARACT EXTRACTION W/PHACO Right 07/13/2020   Procedure: CATARACT EXTRACTION PHACO AND INTRAOCULAR LENS PLACEMENT (I[facility]) RIGHT;  Surgeon: Lockie Mola, MD;  Location: [facility];  Service: Ophthalmology;  Laterality: Right;  7.53 1:21.3 9.3%   CATARACT EXTRACTION W/PHACO Left 08/03/2020   Procedure: CATARACT EXTRACTION PHACO AND INTRAOCULAR LENS PLACEMENT (IOC) LEFT 4.52 00:44.3 10.3%;  Surgeon: Lockie Mola, MD;  Location: [facility];  Service: Ophthalmology;  Laterality: Left;   CHOLECYSTECTOMY     COLONOSCOPY WITH PROPOFOL N/A 12/07/2019   Procedure: COLONOSCOPY WITH PROPOFOL;  Surgeon: Midge Minium, MD;  Location: [facility];  Service: Endoscopy;  Laterality: N/A;   SVT ABLATION N/A 10/24/2020   Procedure: SVT ABLATION;  Surgeon: Lanier Prude, MD;  Location: [facility];  Service: Cardiovascular;  Laterality: N/A;   TUBAL LIGATION      Social History   Socioeconomic History   Marital status: Married    Spouse name: Not on file   Number of children: 2   Years of education: Not on file   Highest education level: Not on file  Occupational History   Not on file  Tobacco Use   Smoking status: Never   Smokeless tobacco: Never  Vaping Use   Vaping Use: Never used  Substance and Sexual Activity   Alcohol use: No    Alcohol/week: 0.0 standard drinks of alcohol   Drug use: No   Sexual activity: Not Currently  Other Topics Concern   Not on file  Social History Narrative   Not on file   Social Determinants of Health   Financial Resource Strain: Low Risk  (09/25/2017)   Overall Financial Resource Strain (CARDIA)    Difficulty of Paying Living Expenses: Not hard at all  Food Insecurity: No Food Insecurity (09/25/2017)   Hunger Vital Sign    Worried About Running Out of Food in the Last Year: Never  true    Ran Out of Food in the Last Year: Never true  Transportation Needs: No Transportation Needs (09/25/2017)   PRAPARE - Administrator, Civil Service (Medical): No    Lack of Transportation (Non-Medical): No  Physical Activity: Inactive (09/25/2017)   Exercise Vital Sign    Days of Exercise per Week: 0 days    Minutes of Exercise per Session: 0 min  Stress: No Stress Concern Present (09/25/2017)   [employer] of Occupational Health - Occupational Stress Questionnaire    Feeling of Stress : Not at all  Social Connections: Moderately Integrated (09/25/2017)   Social Connection and Isolation Panel [NHANES]    Frequency of Communication with Friends and Family: More than three times a week    Frequency of Social Gatherings with Friends and Family: Three times a week    Attends Religious Services: More than 4 times per year    Active Member of Clubs or Organizations: No    Attends Banker Meetings: Never    Marital Status: Married  Catering manager Violence: Not At Risk (09/25/2017)   Humiliation, Afraid, Rape, and Kick questionnaire  Fear of Current or Ex-Partner: No    Emotionally Abused: No    Physically Abused: No    Sexually Abused: No    Family History  Problem Relation Age of Onset   Diabetes Mother    COPD Father    Lung cancer Father 62   Breast cancer Maternal Aunt 74       dx again at 79~metastatic   Cancer Paternal Aunt        jaw cancer dx 39s   Breast cancer Cousin        dx 50s-60s. MAT that had breast cancer's daughter   Throat cancer Cousin      Current Outpatient Medications:    acetaminophen (TYLENOL) 500 MG tablet, Take 1,000 mg by mouth every 6 (six) hours as needed for moderate pain. , Disp: , Rfl:    levocetirizine (XYZAL) 5 MG tablet, Take 5 mg by mouth every evening., Disp: , Rfl:    metoprolol tartrate (LOPRESSOR) 25 MG tablet, Take 0.5 tablets (12.5 mg total) by mouth 2 (two) times daily., Disp: 90 tablet, Rfl:  3   Multiple Vitamins-Minerals (MULTIVITAMIN WITH MINERALS) tablet, Take 1 tablet by mouth daily., Disp: , Rfl:    tamoxifen (NOLVADEX) 20 MG tablet, Take 1 tablet by mouth once daily, Disp: 90 tablet, Rfl: 1  Physical exam:  Vitals:   06/14/22 1124  BP: 114/76  Pulse: 69  Weight: 162 lb (73.5 kg)   Physical Exam Constitutional:      General: She is not in acute distress. Cardiovascular:     Rate and Rhythm: Normal rate and regular rhythm.     Heart sounds: Normal heart sounds.  Pulmonary:     Effort: Pulmonary effort is normal.     Breath sounds: Normal breath sounds.  Abdominal:     General: Bowel sounds are normal.     Palpations: Abdomen is soft.  Skin:    General: Skin is warm and dry.  Neurological:     Mental Status: She is alert and oriented to person, place, and time.   Breast exam: No palpable masses in either breast.  No palpable bilateral axillary adenopathy.     Latest Ref Rng & Units 10/03/2021   10:42 AM  CMP  Glucose 70 - 99 mg/dL 91   BUN 6 - 24 mg/dL 9   Creatinine 0.57 - 1.00 mg/dL 0.80   Sodium 134 - 144 mmol/L 139   Potassium 3.5 - 5.2 mmol/L 4.2   Chloride 96 - 106 mmol/L 101   CO2 20 - 29 mmol/L 28   Calcium 8.7 - 10.2 mg/dL 9.1   Total Protein 6.0 - 8.5 g/dL 6.5   Total Bilirubin 0.0 - 1.2 mg/dL 0.2   Alkaline Phos 44 - 121 IU/L 85   AST 0 - 40 IU/L 14   ALT 0 - 32 IU/L 12       Latest Ref Rng & Units 10/03/2021   10:42 AM  CBC  WBC 3.4 - 10.8 x10E3/uL 6.6   Hemoglobin 11.1 - 15.9 g/dL 12.7   Hematocrit 34.0 - 46.6 % 38.5   Platelets 150 - 450 x10E3/uL 238      Assessment and plan- Patient is a 51 y.o. female with history of ADH on tamoxifen here for routine follow-up  Patient is on tamoxifen for chemoprevention of atypical ductal hyperplasia which was started in August 2021 and will continue apparently 2026. Today she reports that she is tolerating this medication well. Mammogram and examination both  non-concerning at this time.  Continue follow up every 6 months. Mammogram for Dec 2023.    Visit Diagnosis 1. Atypical ductal hyperplasia of left breast   2. Encounter for monitoring tamoxifen therapy      Minna Antis [facility] [id number] 06/14/2022 11:53 AM

## 2022-06-15 ENCOUNTER — Other Ambulatory Visit: Payer: Self-pay | Admitting: Oncology

## 2022-06-15 ENCOUNTER — Other Ambulatory Visit: Payer: Self-pay | Admitting: *Deleted

## 2022-06-15 ENCOUNTER — Telehealth: Payer: Self-pay | Admitting: Oncology

## 2022-06-15 MED ORDER — TAMOXIFEN CITRATE 20 MG PO TABS: 20.0000 mg | ORAL_TABLET | Freq: Every day | ORAL | 2 refills | Status: DC

## 2022-06-15 NOTE — Telephone Encounter (Signed)
Pt called and stated the pharmacy did not have a prescription for her medications. She would like a request sent to [facility]

## 2022-06-15 NOTE — Telephone Encounter (Signed)
Can you do this

## 2022-06-15 NOTE — Telephone Encounter (Signed)
Called the pt back and got her voicemail and I said sorry that the order was not put in for your refill, I have sent it to [facility] [address] and you should be able to get it today. Left my number if she has issues

## 2022-06-20 DIAGNOSIS — Z01419 Encounter for gynecological examination (general) (routine) without abnormal findings: Secondary | ICD-10-CM | POA: Diagnosis not present

## 2022-06-20 DIAGNOSIS — N6092 Unspecified benign mammary dysplasia of left breast: Secondary | ICD-10-CM | POA: Diagnosis not present

## 2022-06-20 DIAGNOSIS — Z1331 Encounter for screening for depression: Secondary | ICD-10-CM | POA: Diagnosis not present

## 2022-07-18 ENCOUNTER — Other Ambulatory Visit: Payer: Self-pay | Admitting: Internal Medicine

## 2022-07-18 DIAGNOSIS — Z1231 Encounter for screening mammogram for malignant neoplasm of breast: Secondary | ICD-10-CM

## 2022-08-09 ENCOUNTER — Emergency Department
Admission: EM | Admit: 2022-08-09 | Discharge: 2022-08-09 | Disposition: A | Discharge status: Home / Self Care | Payer: No Typology Code available for payment source | Attending: Emergency Medicine | Admitting: Emergency Medicine

## 2022-08-09 ENCOUNTER — Other Ambulatory Visit: Payer: Self-pay

## 2022-08-09 ENCOUNTER — Emergency Department: Payer: No Typology Code available for payment source

## 2022-08-09 ENCOUNTER — Ambulatory Visit
Admission: EM | Admit: 2022-08-09 | Discharge: 2022-08-09 | Disposition: A | Discharge status: Home / Self Care | Payer: No Typology Code available for payment source | Attending: Emergency Medicine | Admitting: Emergency Medicine

## 2022-08-09 DIAGNOSIS — Z1152 Encounter for screening for COVID-19: Secondary | ICD-10-CM | POA: Insufficient documentation

## 2022-08-09 DIAGNOSIS — R7989 Other specified abnormal findings of blood chemistry: Secondary | ICD-10-CM | POA: Diagnosis not present

## 2022-08-09 DIAGNOSIS — R300 Dysuria: Secondary | ICD-10-CM

## 2022-08-09 DIAGNOSIS — N3090 Cystitis, unspecified without hematuria: Secondary | ICD-10-CM | POA: Insufficient documentation

## 2022-08-09 DIAGNOSIS — R519 Headache, unspecified: Secondary | ICD-10-CM | POA: Insufficient documentation

## 2022-08-09 DIAGNOSIS — Z9049 Acquired absence of other specified parts of digestive tract: Secondary | ICD-10-CM | POA: Insufficient documentation

## 2022-08-09 DIAGNOSIS — R109 Unspecified abdominal pain: Secondary | ICD-10-CM

## 2022-08-09 DIAGNOSIS — I491 Atrial premature depolarization: Secondary | ICD-10-CM | POA: Insufficient documentation

## 2022-08-09 DIAGNOSIS — R1030 Lower abdominal pain, unspecified: Secondary | ICD-10-CM | POA: Diagnosis present

## 2022-08-09 DIAGNOSIS — R1031 Right lower quadrant pain: Secondary | ICD-10-CM

## 2022-08-09 DIAGNOSIS — I10 Essential (primary) hypertension: Secondary | ICD-10-CM | POA: Diagnosis not present

## 2022-08-09 LAB — COMPREHENSIVE METABOLIC PANEL
ALT: 22 U/L (ref 0–44)
AST: 27 U/L (ref 15–41)
Albumin: 3.6 g/dL (ref 3.5–5.0)
Alkaline Phosphatase: 74 U/L (ref 38–126)
Anion gap: 8 (ref 5–15)
BUN: 9 mg/dL (ref 6–20)
CO2: 25 mmol/L (ref 22–32)
Calcium: 8.8 mg/dL — ABNORMAL LOW (ref 8.9–10.3)
Chloride: 109 mmol/L (ref 98–111)
Creatinine, Ser: 0.88 mg/dL (ref 0.44–1.00)
GFR, Estimated: >60 mL/min (ref >60)
Glucose, Bld: 108 mg/dL — ABNORMAL HIGH (ref 70–99)
Potassium: 3.6 mmol/L (ref 3.5–5.1)
Sodium: 142 mmol/L (ref 135–145)
Total Bilirubin: 0.7 mg/dL (ref 0.3–1.2)
Total Protein: 6.9 g/dL (ref 6.5–8.1)

## 2022-08-09 LAB — URINALYSIS, ROUTINE W REFLEX MICROSCOPIC
Bacteria, UA: NONE SEEN
Bilirubin Urine: NEGATIVE
Glucose, UA: NEGATIVE mg/dL
Hgb urine dipstick: NEGATIVE
Ketones, ur: NEGATIVE mg/dL
Nitrite: NEGATIVE
Protein, ur: NEGATIVE mg/dL
Specific Gravity, Urine: 1.014 (ref 1.005–1.030)
pH: 5 (ref 5.0–8.0)

## 2022-08-09 LAB — POCT URINALYSIS DIP (MANUAL ENTRY)
Bilirubin, UA: NEGATIVE
Glucose, UA: NEGATIVE mg/dL
Ketones, POC UA: NEGATIVE mg/dL
Nitrite, UA: NEGATIVE
Protein Ur, POC: NEGATIVE mg/dL
Spec Grav, UA: 1.01 (ref 1.010–1.025)
Urobilinogen, UA: 0.2 E.U./dL
pH, UA: 5.5 (ref 5.0–8.0)

## 2022-08-09 LAB — CBC
HCT: 36.7 % (ref 36.0–46.0)
Hemoglobin: 12.4 g/dL (ref 12.0–15.0)
MCH: 28.6 pg (ref 26.0–34.0)
MCHC: 33.8 g/dL (ref 30.0–36.0)
MCV: 84.6 fL (ref 80.0–100.0)
Platelets: 239 10*3/uL (ref 150–400)
RBC: 4.34 MIL/uL (ref 3.87–5.11)
RDW: 12.7 % (ref 11.5–15.5)
WBC: 6.1 10*3/uL (ref 4.0–10.5)
nRBC: 0 % (ref 0.0–0.2)

## 2022-08-09 LAB — RESP PANEL BY RT-PCR (FLU A&B, COVID) ARPGX2
Influenza A by PCR: NEGATIVE
Influenza B by PCR: NEGATIVE
SARS Coronavirus 2 by RT PCR: NEGATIVE

## 2022-08-09 LAB — TROPONIN I (HIGH SENSITIVITY)
Troponin I (High Sensitivity): 23 ng/L — ABNORMAL HIGH (ref <18)
Troponin I (High Sensitivity): 22 ng/L — ABNORMAL HIGH (ref <18)

## 2022-08-09 LAB — LIPASE, BLOOD: Lipase: 36 U/L (ref 11–51)

## 2022-08-09 MED ORDER — IOHEXOL 300 MG/ML  SOLN
100.0000 mL | Freq: Once | INTRAMUSCULAR | Status: AC | PRN
  Administered 2022-08-09: 100 mL via INTRAVENOUS

## 2022-08-09 MED ORDER — FOSFOMYCIN TROMETHAMINE 3 G PO PACK
3.0000 g | PACK | Freq: Once | ORAL | Status: AC
  Administered 2022-08-09: 3 g via ORAL
  Filled 2022-08-09: qty 3

## 2022-08-09 NOTE — ED Provider Notes (Signed)
[facility] Provider Note    Event Date/Time   First MD Initiated Contact with Patient 08/09/22 1502     (approximate)   History   Abdominal Pain   HPI  Tammy Acevedo is a 51 y.o. female who is already had her gallbladder removed who complains of off-and-on abdominal discomfort.  Mostly in the lower abdomen at this time.  She reports coming here for CT scan to be ruled out for appendicitis.  She does report some urinary symptoms and thought it could just be a UTI but her urine was reassuring.  She does report a light slight twinge of a headache but reports it is very mild in nature.  She reports her husband is also been sick and is getting tested for COVID currently.  She denies any chest pain, shortness of breath or other concerns   Physical Exam   Triage Vital Signs: ED Triage Vitals  Enc Vitals Group     BP 08/09/22 1242 130/71     Pulse Rate 08/09/22 1242 66     Resp 08/09/22 1242 18     Temp 08/09/22 1242 98.4 F (36.9 C)     Temp Source 08/09/22 1242 Oral     SpO2 08/09/22 1242 98 %     Weight 08/09/22 1243 156 lb (70.8 kg)     Height 08/09/22 1243 5\' 6"  (1.676 m)     Head Circumference --      Peak Flow --      Pain Score 08/09/22 1243 5     Pain Loc --      Pain Edu? --      Excl. in [place]? --     Most recent vital signs: Vitals:   08/09/22 1242  BP: 130/71  Pulse: 66  Resp: 18  Temp: 98.4 F (36.9 C)  SpO2: 98%     General: Awake, no distress.  CV:  Good peripheral perfusion.  Resp:  Normal effort.  Abd:  No distention.  Tender in the right lower quadrant. Other:     ED Results / Procedures / Treatments   Labs (all labs ordered are listed, but only abnormal results are displayed) Labs Reviewed  COMPREHENSIVE METABOLIC PANEL - Abnormal; Notable for the following components:      Result Value   Glucose, Bld 108 (*)    Calcium 8.8 (*)    All other components within normal limits  URINALYSIS, ROUTINE W REFLEX  MICROSCOPIC - Abnormal; Notable for the following components:   Color, Urine YELLOW (*)    APPearance HAZY (*)    Leukocytes,Ua SMALL (*)    All other components within normal limits  LIPASE, BLOOD  CBC     EKG  My interpretation of EKG:  Normal sinus rate of 61 without any ST elevation with occasional PAC, normal intervals  RADIOLOGY I have reviewed the CT personally and interpreted no signs of appendicitis   PROCEDURES:  Critical Care performed: No  Procedures   MEDICATIONS ORDERED IN ED: Medications  fosfomycin (MONUROL) packet 3 g (has no administration in time range)  iohexol (OMNIPAQUE) 300 MG/ML solution 100 mL (100 mLs Intravenous Contrast Given 08/09/22 1603)     IMPRESSION / MDM / ASSESSMENT AND PLAN / ED COURSE  I reviewed the triage vital signs and the nursing notes.   Patient's presentation is most consistent with acute presentation with potential threat to life or bodily function.   Patient comes in with concerns for abdominal pain  mostly in the right lower quadrant today but a little bit of upper abdominal pain yesterday therefore EKG, cardiac markers were ordered evaluate for ACS.  Urine evaluate for UTI given some urinary symptoms and labs to evaluate for any kind of pancreatitis, retained stone and CT to evaluate for any appendicitis.  Patient declined needing anything for pain.  Troponin is downtrending from 23-22.  COVID test was negative.  Urine with some amount of WBCs and small leuks and given patient reports some pressure we will treat with a dose of fosfomycin and send for urine culture.  Her CMP was reassuring CBC reassuring CT scan was reassuring.  Patient has a low heart score unclear why her troponins would be slightly elevated but they are downtrending today she denies any exertional chest pain and denies any chest pain or shortness of breath at this time I do think it is probably just non specific incidental but I did talke to Dr. Cristal Deer  who agrees and will help get her follow up with Dr. Mariah Milling.   Patient does report that her husband is also been feeling sick with fevers and suspect patient could have a viral illness that is causing some GI upset as well.  Patient expressed understanding felt comfortable with discharge home but will return to the ER if she develops chest pain shortness of breath or any other concerns     FINAL CLINICAL IMPRESSION(S) / ED DIAGNOSES   Final diagnoses:  Cystitis  Abdominal pain, unspecified abdominal location     Rx / DC Orders   ED Discharge Orders     None        Note:  This document was prepared using Dragon voice recognition software and may include unintentional dictation errors.   Concha Se, MD 08/09/22 [phone]

## 2022-08-09 NOTE — Discharge Instructions (Signed)
Go to the emergency department for evaluation of your right lower abdominal pain.     

## 2022-08-09 NOTE — ED Provider Notes (Signed)
Tammy Acevedo    CSN: [id number] Arrival date & time: 08/09/22  0948      History   Chief Complaint Chief Complaint  Patient presents with   Headache   Urinary Frequency    HPI Tammy Acevedo is a 51 y.o. female.  Patient presents with 4 day history of bilateral upper abdominal pain and low back pain.  She has intermittent sharp pain in her right lower abdomen since last night.  She also reports dysuria and urinary frequency.  She denies fever, chills, vomiting, diarrhea, constipation, hematuria, or other symptoms.  No treatments at home.  Her medical history includes hypertension.  The history is provided by the patient and medical records.    Past Medical History:  Diagnosis Date   AVNRT (AV nodal re-entry tachycardia)    a. 10/2020 s/p EPS and RFCA.   Chest pain    a. 08/2015 ETT: Ex time 9:01, Max HR 166, No ECG changes->Nl study.   COVID-19 08/31/2019   Family history of breast cancer    Family history of lung cancer    Family history of throat cancer    Hypertension    Mild Mitral regurgitation    a. 08/2015 Echo: EF 50%, no rwma, Triv TR/PR, mild MR; b. 10/2020 Echo: EF 60-65%, no rwma, nl RV size/fxn, no MR/MS.   SVT (supraventricular tachycardia) 04/04/2017   Started On beta blocker; followed by Dr. 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   Gastroesophageal reflux disease without esophagitis 12/17/2017   Anxiety 12/17/2017   Bilateral carpal tunnel syndrome 03/20/2012    Past Surgical History:  Procedure Laterality Date   BREAST BIOPSY Left 08/11/2019   distortion, coil marker, radial scar and ADH   BREAST BIOPSY Left 10/07/2019   Procedure: BREAST BIOPSY WITH NEEDLE  LOCALIZATION;  Surgeon: Campbell Lerner, MD;  Location: [facility] ORS;  Service: General;  Laterality: Left;   BREAST LUMPECTOMY Left 10/07/2019   radial scar, ADH, dense stromal fibrosis excisied, clear surgical margins   CARPAL TUNNEL RELEASE     CATARACT EXTRACTION W/PHACO Right 07/13/2020   Procedure: CATARACT EXTRACTION PHACO AND INTRAOCULAR LENS PLACEMENT (IOC) RIGHT;  Surgeon: Lockie Mola, MD;  Location: [facility];  Service: Ophthalmology;  Laterality: Right;  7.53 1:21.3 9.3%   CATARACT EXTRACTION W/PHACO Left 08/03/2020   Procedure: CATARACT EXTRACTION PHACO AND INTRAOCULAR LENS PLACEMENT (IOC) LEFT 4.52 00:44.3 10.3%;  Surgeon: Lockie Mola, MD;  Location: [facility];  Service: Ophthalmology;  Laterality: Left;   CHOLECYSTECTOMY     COLONOSCOPY WITH PROPOFOL N/A 12/07/2019   Procedure: COLONOSCOPY WITH PROPOFOL;  Surgeon: Midge Minium, MD;  Location: [facility];  Service: Endoscopy;  Laterality: N/A;   SVT ABLATION N/A 10/24/2020   Procedure: SVT ABLATION;  Surgeon: Lanier Prude, MD;  Location: [facility] INVASIVE CV LAB;  Service: Cardiovascular;  Laterality: N/A;   TUBAL LIGATION      OB History   No obstetric history on file.      Home Medications    Prior to Admission medications   Medication Sig Start Date End Date Taking? Authorizing Provider  acetaminophen (TYLENOL) 500 MG tablet Take 1,000 mg by mouth  every 6 (six) hours as needed for moderate pain.     [provider]  levocetirizine (XYZAL) 5 MG tablet Take 5 mg by mouth every evening.    [provider]  metoprolol tartrate (LOPRESSOR) 25 MG tablet Take 0.5 tablets (12.5 mg total) by mouth 2 (two) times daily. 03/14/22 03/09/23  [name], MD  Multiple Vitamins-Minerals (MULTIVITAMIN WITH MINERALS) tablet Take 1 tablet by mouth daily.    [provider]  tamoxifen (NOLVADEX) 20 MG tablet Take 1 tablet by mouth once daily 06/18/22   Tammy Guadeloupe,  MD  tamoxifen (NOLVADEX) 20 MG tablet Take 1 tablet (20 mg total) by mouth daily. 06/15/22   Tammy Guadeloupe, MD    Family History Family History  Problem Relation Age of Onset   Diabetes Mother    COPD Father    Lung cancer Father 97   Breast cancer Maternal Aunt 74       dx again at 79~metastatic   Cancer Paternal Aunt        jaw cancer dx 31s   Breast cancer Cousin        dx 50s-60s. MAT that had breast cancer's daughter   Throat cancer Cousin     Social History Social History   Tobacco Use   Smoking status: Never   Smokeless tobacco: Never  Vaping Use   Vaping Use: Never used  Substance Use Topics   Alcohol use: No    Alcohol/week: 0.0 standard drinks of alcohol   Drug use: No     Allergies   Patient has no known allergies.   Review of Systems Review of Systems  Constitutional:  Negative for chills and fever.  Respiratory:  Negative for cough and shortness of breath.   Cardiovascular:  Negative for chest pain and palpitations.  Gastrointestinal:  Positive for abdominal pain. Negative for blood in stool, constipation, diarrhea, nausea and vomiting.  Genitourinary:  Positive for dysuria and frequency. Negative for hematuria.  Musculoskeletal:  Positive for back pain. Negative for arthralgias.  All other systems reviewed and are negative.    Physical Exam Triage Vital Signs ED Triage Vitals  Enc Vitals Group     BP 08/09/22 1111 120/66     Pulse Rate 08/09/22 1107 65     Resp 08/09/22 1107 18     Temp 08/09/22 1107 98.4 F (36.9 C)     Temp src --      SpO2 08/09/22 1107 97 %     Weight 08/09/22 1109 156 lb (70.8 kg)     Height 08/09/22 1109 5\' 6"  (1.676 m)     Head Circumference --      Peak Flow --      Pain Score 08/09/22 1105 5     Pain Loc --      Pain Edu? --      Excl. in [place]? --    No data found.  Updated Vital Signs BP 120/66   Pulse 65   Temp 98.4 F (36.9 C)   Resp 18   Ht 5\' 6"  (1.676 m)   Wt 156 lb (70.8 kg)   SpO2 97%   BMI  25.18 kg/m   Visual Acuity Right Eye Distance:   Left Eye Distance:   Bilateral Distance:    Right Eye Near:   Left Eye Near:    Bilateral Near:     Physical Exam Vitals and nursing note reviewed.  Constitutional:      General: She  is not in acute distress.    Appearance: Normal appearance. She is well-developed. She is not ill-appearing.  HENT:     Mouth/Throat:     Mouth: Mucous membranes are moist.  Cardiovascular:     Rate and Rhythm: Normal rate and regular rhythm.     Heart sounds: Normal heart sounds.  Pulmonary:     Effort: Pulmonary effort is normal. No respiratory distress.     Breath sounds: Normal breath sounds.  Abdominal:     General: Bowel sounds are normal.     Palpations: Abdomen is soft.     Tenderness: There is abdominal tenderness in the right lower quadrant. There is no right CVA tenderness, left CVA tenderness, guarding or rebound.     Comments: Mild tenderness to palpation of RLQ. No rebound or guarding.   Musculoskeletal:     Cervical back: Neck supple.  Skin:    General: Skin is warm and dry.  Neurological:     Mental Status: She is alert.  Psychiatric:        Mood and Affect: Mood normal.        Behavior: Behavior normal.      UC Treatments / Results  Labs (all labs ordered are listed, but only abnormal results are displayed) Labs Reviewed  POCT URINALYSIS DIP (MANUAL ENTRY) - Abnormal; Notable for the following components:      Result Value   Blood, UA trace-intact (*)    Leukocytes, UA Small (1+) (*)    All other components within normal limits  URINE CULTURE    EKG   Radiology No results found.  Procedures Procedures (including critical care time)  Medications Ordered in UC Medications - No data to display  Initial Impression / Assessment and Plan / UC Course  I have reviewed the triage vital signs and the nursing notes.  Pertinent labs & imaging results that were available during my care of the patient were reviewed  by me and considered in my medical decision making (see chart for details).    RLQ abdominal pain and dysuria.  Patient reports sharp intermittent right lower quadrant abdominal pain since last night.  Afebrile and vital signs are stable.  Discussed limitations of evaluation in an urgent care setting.  Sending her to the ED for evaluation.  She is accompanied by her husband and is agreeable to going to the ED for evaluation.  Final Clinical Impressions(s) / UC Diagnoses   Final diagnoses:  Right lower quadrant abdominal pain  Dysuria     Discharge Instructions      Go to the emergency department for evaluation of your right lower abdominal pain.     ED Prescriptions   None    PDMP not reviewed this encounter.   Sharion Balloon, NP 08/09/22 1138

## 2022-08-09 NOTE — Discharge Instructions (Signed)
We given you a dose of antibiotic to cover a UTI.  Your cardiac markers were slightly elevated but downtrending upon repeat and given you had no chest pain or shortness of breath are probably just incidental in nature but I discussed with the cardiology team who can work on getting you a follow-up appointment with Dr. Mariah Milling however if you do develop chest pain or shortness of breath you should return to the ER but at this time I suspect this is most likely a viral illness.

## 2022-08-09 NOTE — ED Triage Notes (Addendum)
Patient to Urgent Care with complaints of urinary urgency and headache.  Reports intermittent generalized abdominal pain all week- believes it could have been because of something she ate. Yesterday reports that she started experiencing lower back ache and right sided groin pain. Reports urinary urgency and feeling like she isn't fully emptying her bladder. Some dysuria. Denies any known fevers.   Intermittent headache x1 week- reports a "twinge" across her eyebrows.

## 2022-08-09 NOTE — ED Triage Notes (Signed)
Pt comes in with a complaint of abdominal pain off and on since Sunday. Pt went to urgent care with the complaint of headache and urine frequency earlier today. Pt states its and achy pain in her mid stomach, and a sharp pain in her right lower abdomen. Pt states at time she has the urgency to urinate, but is unable to fully void when she goes.  Pt NAD at this time.

## 2022-08-09 NOTE — ED Notes (Signed)
See triage note    Presents with some abd pain    States she developed pain to mid to lower abd and lower back pain   Denies any fever or n/v  States pain is sharper in the right lower area

## 2022-08-10 ENCOUNTER — Telehealth: Payer: Self-pay | Admitting: Cardiovascular Disease

## 2022-08-10 NOTE — Telephone Encounter (Signed)
LVM to schedule hospital fu, please schedule

## 2022-08-11 LAB — URINE CULTURE: Culture: NO GROWTH

## 2022-08-14 ENCOUNTER — Telehealth: Payer: Self-pay

## 2022-08-14 NOTE — Progress Notes (Unsigned)
Cardiology Clinic Note   Patient Name: Tammy Acevedo Date of Encounter: 08/15/2022  Primary Care Provider:  Reubin Milan, MD Primary Cardiologist:  Tammy Nordmann, MD  Patient Profile    Tammy Acevedo is a 51 year-old female with a past medical history of PSVT/AVNRT s/p catheter ablation 2022, chest pain, palpitations/presyncope, and mild mitral regurgitation who presents to the clinic today for hospital follow-up.   Past Medical History    Past Medical History:  Diagnosis Date   AVNRT (AV nodal re-entry tachycardia)    a. 10/2020 s/p EPS and RFCA.   Chest pain    a. 08/2015 ETT: Ex time 9:01, Max HR 166, No ECG changes->Nl study.   COVID-19 08/31/2019   Family history of breast cancer    Family history of lung cancer    Family history of throat cancer    Hypertension    Mild Mitral regurgitation    a. 08/2015 Echo: EF 50%, no rwma, Triv TR/PR, mild MR; b. 10/2020 Echo: EF 60-65%, no rwma, nl RV size/fxn, no MR/MS.   SVT (supraventricular tachycardia) 04/04/2017   Started On beta blocker; followed by Tammy Acevedo S/p Ablation 10/2020 - off of all medications   Wears dentures    partial upper   Past Surgical History:  Procedure Laterality Date   BREAST BIOPSY Left 08/11/2019   distortion, coil marker, radial scar and ADH   BREAST BIOPSY Left 10/07/2019   Procedure: BREAST BIOPSY WITH NEEDLE LOCALIZATION;  Surgeon: Campbell Lerner, MD;  Location: [facility] OR[facility];  Service: General;  Laterality: Left;   BREAST LUMPECTOMY Left 10/07/2019   radial scar, ADH, dense stromal fibrosis excisied, clear surgical margins   CARPAL TUNNEL RELEASE     CATARACT EXTRACTION W/PHACO Right 07/13/2020   Procedure: CATARACT EXTRACTION PHACO AND INTRAOCULAR LENS PLACEMENT (IOC) RIGHT;  Surgeon: Lockie Mola, MD;  Location: [facility];  Service: Ophthalmology;  Laterality: Right;  7.53 1:21.3 9.3%   CATARACT EXTRACTION W/PHACO Left 08/03/2020   Procedure: CATARACT EXTRACTION  PHACO AND INTRAOCULAR LENS PLACEMENT (IOC) LEFT 4.52 00:44.3 10.3%;  Surgeon: Lockie Mola, MD;  Location: [facility];  Service: Ophthalmology;  Laterality: Left;   CHOLECYSTECTOMY     COLONOSCOPY WITH PROPOFOL N/A 12/07/2019   Procedure: COLONOSCOPY WITH PROPOFOL;  Surgeon: Midge Minium, MD;  Location: [facility];  Service: Endoscopy;  Laterality: N/A;   SVT ABLATION N/A 10/24/2020   Procedure: SVT ABLATION;  Surgeon: Lanier Prude, MD;  Location: [facility];  Service: Cardiovascular;  Laterality: N/A;   TUBAL LIGATION      Allergies  No Known Allergies  History of Present Illness    Tammy Acevedo has a past medical history of: PSVT/AVNRT. Echo 10/12/2020: EF 60-65%.  SVT Ablation 10/24/2020: Radiofrequency ablation of supraventricular tachycardia (AVNRT). Nonclinical atrial tachycardia with proximal to distal CS activation inducible at EP study (250 bpm). Consider repeat heart monitor and suppression with antiarrhythmia if recurrent palpitations.  Chest pain.  Treadmill stress test 08/05/2015: Normal exercise stress test. Excellent exercise tolerance. No EKG evidence of ischemia.  Palpitations/presyncope.  14 day Zio 02/23/2022: Ventricular tachycardia runs fastest 4 beats max rate 197 bpm, longest 8 beats with average rate 147 bpm. Supraventricular tachycardia runs fastest 6 beats max rate 185 bpm, longest 11 beats average rate 125 bpm. Isolated SVEs occasional (4.3%), SVE couplets rare (<1%), SVE triplets rare (<1%). Isolated VE rare (<1%), VE couplets rare (<1%), no VE triplets.  Echo 03/21/2022: EF 55-60%.  Mild mitral  valve regurgitation.  Echo performed at [facility] 2016: EF 50%, mild mitral valve regurgitation.  Echo June 2023: Normal valves with no evidence of regurgitation.   Tammy Acevedo has been a patient of cardiology since she referred herself for evaluation of SVT in 2017. She was last seen in the office by Tammy Acevedo on 03/14/2022 at which  time she complained of occasional dizziness. No changes were made to her medications at that time.   More recently, patient was seen in the ED on 08/09/2022 for abdominal pain and possible CT to rule out appendicitis (sent from urgent care). Her pain was in her lower right abdomen and radiating across her suprapubic area. She also had pain across her low back. Her troponin was elevated but trending down from 23 to 22. Her EKG was normal and she denied chest pain. Cardiology was consulted and she was set up for outpatient cardiology follow-up.   Today, patient is doing well since being seen in the hospital for abdominal pain. 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 She denies change in energy level or ability to do exertional activities at work.  Patient denies shortness of breath or dyspnea on exertion. No chest pain, pressure, or tightness. Denies lower extremity edema, orthopnea, or PND. No palpitations since starting metoprolol in June. No lightheadedness or orthostasis since starting on Metoprolol.   Home Medications    Current Meds  Medication Sig   acetaminophen (TYLENOL) 500 MG tablet Take 1,000 mg by mouth every 6 (six) hours as needed for moderate pain.    levocetirizine (XYZAL) 5 MG tablet Take 5 mg by mouth every evening.   metoprolol tartrate (LOPRESSOR) 25 MG tablet Take 0.5 tablets (12.5 mg total) by mouth 2 (two) times daily.   Multiple Vitamins-Minerals (MULTIVITAMIN WITH MINERALS) tablet Take 1 tablet by mouth daily.   tamoxifen (NOLVADEX) 20 MG tablet Take 1 tablet by mouth once daily   tamoxifen (NOLVADEX) 20 MG tablet Take 1 tablet (20 mg total) by mouth daily.    Family History    Family History  Problem Relation Age of Onset   Diabetes Mother    COPD Father    Lung cancer Father 49   Breast cancer Maternal Aunt 66       dx again at 79~metastatic   Cancer Paternal Aunt        jaw cancer dx 1s   Breast cancer Cousin        dx  50s-60s. MAT that had breast cancer's daughter   Throat cancer Cousin    She indicated that her mother is alive. She indicated that her father is deceased. She indicated that her sister is alive. She indicated that her brother is alive. She indicated that her maternal grandmother is deceased. She indicated that her maternal grandfather is deceased. She indicated that her paternal grandmother is deceased. She indicated that her paternal grandfather is deceased. She indicated that her daughter is alive. She indicated that her son is alive. She indicated that only one of her two maternal aunts is alive. She indicated that her maternal uncle is alive. She indicated that her paternal aunt is alive. She indicated that her paternal uncle is alive. She indicated that her cousin is deceased.   Social History    Social History   Socioeconomic History   Marital status: Married    Spouse name: Not on file   Number of children: 2  Years of education: Not on file   Highest education level: Not on file  Occupational History   Not on file  Tobacco Use   Smoking status: Never   Smokeless tobacco: Never  Vaping Use   Vaping Use: Never used  Substance and Sexual Activity   Alcohol use: No    Alcohol/week: 0.0 standard drinks of alcohol   Drug use: No   Sexual activity: Not Currently  Other Topics Concern   Not on file  Social History Narrative   Not on file   Social Determinants of Health   Financial Resource Strain: Low Risk  (09/25/2017)   Overall Financial Resource Strain (CARDIA)    Difficulty of Paying Living Expenses: Not hard at all  Food Insecurity: No Food Insecurity (09/25/2017)   Hunger Vital Sign    Worried About Running Out of Food in the Last Year: Never true    Ran Out of Food in the Last Year: Never true  Transportation Needs: No Transportation Needs (09/25/2017)   PRAPARE - Administrator, Civil ServiceTransportation    Lack of Transportation (Medical): No    Lack of Transportation (Non-Medical): No   Physical Activity: Inactive (09/25/2017)   Exercise Vital Sign    Days of Exercise per Week: 0 days    Minutes of Exercise per Session: 0 min  Stress: No Stress Concern Present (09/25/2017)   [employer] - Occupational Stress Questionnaire    Feeling of Stress : Not at all  Social Connections: Moderately Integrated (09/25/2017)   Social Connection and Isolation Panel [NHANES]    Frequency of Communication with Friends and Family: More than three times a week    Frequency of Social Gatherings with Friends and Family: Three times a week    Attends Religious Services: More than 4 times per year    Active Member of Clubs or Organizations: No    Attends BankerClub or Organization Meetings: Never    Marital Status: Married  Catering managerntimate Partner Violence: Not At Risk (09/25/2017)   Humiliation, Afraid, Rape, and Kick questionnaire    Fear of Current or Ex-Partner: No    Emotionally Abused: No    Physically Abused: No    Sexually Abused: No     Review of Systems    General: No chills, fever, night sweats or weight changes.  Cardiovascular:  No chest pain, dyspnea on exertion, edema, orthopnea, palpitations, paroxysmal nocturnal dyspnea. Dermatological: No rash, lesions/masses Respiratory: No cough, dyspnea Urologic: No hematuria, dysuria Abdominal:   No nausea, vomiting, diarrhea, bright red blood per rectum, melena, or hematemesis Neurologic:  No visual changes, weakness, changes in mental status. All other systems reviewed and are otherwise negative except as noted above.  Physical Exam    VS:  BP 116/60   Pulse 66   Ht 5\' 6"  (1.676 m)   Wt 163 lb 12.8 oz (74.3 kg)   SpO2 98%   BMI 26.44 kg/m  , BMI Body mass index is 26.44 kg/m. GEN:  Well nourished, well developed, in no acute distress. HEENT: Normal. Neck: Supple, no JVD, carotid bruits, or masses. Cardiac: RRR, no murmurs, rubs, or gallops. No clubbing, cyanosis, edema.  Radials/DP/PT 2+ and equal  bilaterally.  Respiratory:  Respirations regular and unlabored, clear to auscultation bilaterally. GI: Soft, nontender, nondistended. MS: No deformity or atrophy. Skin: Warm and dry, no rash. Neuro: Strength and sensation are intact. Psych: Normal affect.  Accessory Clinical Findings     Recent Labs: 10/03/2021: TSH 1.670 08/09/2022: ALT 22; BUN 9;  Creatinine, Ser 0.88; Hemoglobin 12.4; Platelets 239; Potassium 3.6; Sodium 142   Recent Lipid Panel    Component Value Date/Time   CHOL 146 10/03/2021 1042   TRIG 177 (H) 10/03/2021 1042   HDL 43 10/03/2021 1042   CHOLHDL 3.4 10/03/2021 1042   CHOLHDL 4 12/15/2012 0857   VLDL 14.4 12/15/2012 0857   LDLCALC 73 10/03/2021 1042       ECG is not indicated today.    Assessment & Plan   Elevated troponin. Patient had increase in troponin that trended down while in the emergency room for abdominal pain. Troponon 23 to 22. She denies chest pain while in ED or since. No shortness of breath or DOE. EKG was normal in ED. Given absence of chest pain, shortness of breath or change in energy level ischemic workup not warranted at this time.  PSVT/AVNRT/Palpitations: S/p SVT ablation 10/24/2020. She was noted to have nonclinical atrial tachycardia during her EP study. She had recurrent palpitations and presyncope in 2023. 14-day Zio showed predominately sinus rhythm. Echo was normal. Low dose metoprolol was suggested if BP allowed. Patient denies palpitations since starting Metoprolol. She denies dizziness or orthostasis. Continue Metoprolol 12.5 mg twice a day.        Disposition: Return to Dr. Mariah Milling in June 2024 per previous recall.    Etta Grandchild. Jade Burkard, NP-C     08/15/2022, 11:46 AM [facility] [address] Office [phone] Fax [phone]   I spent 8 minutes examining this patient, reviewing medications, and using patient centered shared decision making involving her cardiac care.   Prior to her visit I spent greater than 20 minutes reviewing her past medical history,  medications, and prior cardiac tests.

## 2022-08-14 NOTE — Telephone Encounter (Signed)
Transition Care Management Unsuccessful Follow-up Telephone Call  Date of discharge and from where:  [facility] 08/09/2022  Attempts:  1st Attempt  Reason for unsuccessful TCM follow-up call:  Left voice message

## 2022-08-14 NOTE — Telephone Encounter (Signed)
Pt returned call during lunch

## 2022-08-14 NOTE — Telephone Encounter (Signed)
Transition Care Management Follow-up Telephone Call Date of discharge and from where: [facility] 08/09/2022 How have you been since you were released from the hospital? Doing Okay, Had abnormal EKG Any questions or concerns? No  Items Reviewed: Did the pt receive and understand the discharge instructions provided? Yes  Medications obtained and verified? Yes  Other? No  Any new allergies since your discharge? No  Dietary orders reviewed? Yes Do you have support at home? Yes    Functional Questionnaire: (I = Independent and D = Dependent) ADLs: I  Bathing/Dressing- I  Meal Prep- I  Eating- I  Maintaining continence- I  Transferring/Ambulation- I  Managing Meds- I  Follow up appointments reviewed:  PCP Hospital f/u appt confirmed? No  Scheduled with cardiology tomorrow. Specialist Hospital f/u appt confirmed? Cardiology at Dr Alben Spittle in [place] on 08/15/2022 at 11 AM.   Are transportation arrangements needed? No  If their condition worsens, is the pt aware to call PCP or go to the Emergency Dept.? Yes Was the patient provided with contact information for the PCP's office or ED? Yes Was to pt encouraged to call back with questions or concerns? Yes

## 2022-08-15 ENCOUNTER — Encounter: Payer: Self-pay | Admitting: Student

## 2022-08-15 ENCOUNTER — Ambulatory Visit: Payer: No Typology Code available for payment source | Attending: Physician Assistant | Admitting: Student

## 2022-08-15 VITALS — BP 116/60 | HR 66 | Ht 66.0 in | Wt 163.8 lb

## 2022-08-15 DIAGNOSIS — R7989 Other specified abnormal findings of blood chemistry: Secondary | ICD-10-CM | POA: Diagnosis not present

## 2022-08-15 DIAGNOSIS — R002 Palpitations: Secondary | ICD-10-CM | POA: Diagnosis not present

## 2022-08-15 DIAGNOSIS — I471 Supraventricular tachycardia, unspecified: Secondary | ICD-10-CM | POA: Diagnosis not present

## 2022-08-15 NOTE — Patient Instructions (Signed)
Medication Instructions:  Your physician recommends that you continue on your current medications as directed. Please refer to the Current Medication list given to you today.  *If you need a refill on your cardiac medications before your next appointment, please call your pharmacy*   Lab Work: None ordered  If you have labs (blood work) drawn today and your tests are completely normal, you will receive your results only by: MyChart Message (if you have MyChart) OR A paper copy in the mail If you have any lab test that is abnormal or we need to change your treatment, we will call you to review the results.   Testing/Procedures: None ordered   Follow-Up: At [facility], you and your health needs are our priority.  As part of our continuing mission to provide you with exceptional heart care, we have created designated Provider Care Teams.  These Care Teams include your primary Cardiologist (physician) and Advanced Practice Providers (APPs -  Physician Assistants and Nurse Practitioners) who all work together to provide you with the care you need, when you need it.  We recommend signing up for the patient portal called "MyChart".  Sign up information is provided on this After Visit Summary.  MyChart is used to connect with patients for Virtual Visits (Telemedicine).  Patients are able to view lab/test results, encounter notes, upcoming appointments, etc.  Non-urgent messages can be sent to your provider as well.   To learn more about what you can do with MyChart, go to [email or web address].    Your next appointment:   6 month(s)  The format for your next appointment:   In Person  Provider:   You may see Julien Nordmann, MD or one of the following Advanced Practice Providers on your designated Care Team:   Nicolasa Ducking, NP Eula Listen, PA-C Cadence Fransico Michael, PA-C Charlsie Quest, NP    Other Instructions   Important Information About Sugar

## 2022-09-03 ENCOUNTER — Ambulatory Visit
Admission: RE | Admit: 2022-09-03 | Discharge: 2022-09-03 | Disposition: A | Discharge status: Home / Self Care | Payer: No Typology Code available for payment source | Source: Ambulatory Visit | Attending: Internal Medicine | Admitting: Internal Medicine

## 2022-09-03 DIAGNOSIS — Z1231 Encounter for screening mammogram for malignant neoplasm of breast: Secondary | ICD-10-CM | POA: Insufficient documentation

## 2022-10-03 ENCOUNTER — Encounter: Payer: Self-pay | Admitting: Internal Medicine

## 2022-10-03 DIAGNOSIS — N6092 Unspecified benign mammary dysplasia of left breast: Secondary | ICD-10-CM

## 2022-10-03 DIAGNOSIS — I471 Supraventricular tachycardia, unspecified: Secondary | ICD-10-CM

## 2022-10-03 DIAGNOSIS — I10 Essential (primary) hypertension: Secondary | ICD-10-CM | POA: Insufficient documentation

## 2022-10-03 NOTE — Assessment & Plan Note (Signed)
S/p excisional biopsy On Tamoxifen

## 2022-10-04 ENCOUNTER — Encounter: Payer: Self-pay | Admitting: Internal Medicine

## 2022-10-04 ENCOUNTER — Ambulatory Visit (INDEPENDENT_AMBULATORY_CARE_PROVIDER_SITE_OTHER): Payer: No Typology Code available for payment source | Admitting: Internal Medicine

## 2022-10-04 VITALS — BP 100/58 | HR 71 | Temp 98.2°F | Ht 66.0 in | Wt 158.2 lb

## 2022-10-04 DIAGNOSIS — Z0000 Encounter for general adult medical examination without abnormal findings: Secondary | ICD-10-CM

## 2022-10-04 DIAGNOSIS — R109 Unspecified abdominal pain: Secondary | ICD-10-CM | POA: Diagnosis not present

## 2022-10-04 DIAGNOSIS — Z131 Encounter for screening for diabetes mellitus: Secondary | ICD-10-CM

## 2022-10-04 DIAGNOSIS — I471 Supraventricular tachycardia, unspecified: Secondary | ICD-10-CM

## 2022-10-04 DIAGNOSIS — E785 Hyperlipidemia, unspecified: Secondary | ICD-10-CM

## 2022-10-04 DIAGNOSIS — N3000 Acute cystitis without hematuria: Secondary | ICD-10-CM

## 2022-10-04 DIAGNOSIS — I10 Essential (primary) hypertension: Secondary | ICD-10-CM

## 2022-10-04 DIAGNOSIS — N6092 Unspecified benign mammary dysplasia of left breast: Secondary | ICD-10-CM | POA: Diagnosis not present

## 2022-10-04 LAB — POCT UA - MICROSCOPIC ONLY
Casts, Ur, LPF, POC: 0
Crystals, Ur, HPF, POC: 0
Mucus, UA: 0
RBC, Urine, Miroscopic: 0 (ref 0–2)
WBC, Ur, HPF, POC: 5 (ref 0–5)
Yeast, UA: 0

## 2022-10-04 LAB — POCT URINALYSIS DIPSTICK
Bilirubin, UA: NEGATIVE
Blood, UA: NEGATIVE
Glucose, UA: NEGATIVE
Ketones, UA: NEGATIVE
Nitrite, UA: NEGATIVE
Protein, UA: NEGATIVE
Spec Grav, UA: 1.01 (ref 1.010–1.025)
Urobilinogen, UA: 0.2 E.U./dL
pH, UA: 6.5 (ref 5.0–8.0)

## 2022-10-04 MED ORDER — NITROFURANTOIN MONOHYD MACRO 100 MG PO CAPS: 100.0000 mg | ORAL_CAPSULE | Freq: Two times a day (BID) | ORAL | 0 refills | Status: AC

## 2022-10-04 NOTE — Assessment & Plan Note (Addendum)
S/p excision 2020 On tamoxifen with minimal side effects with plan to continue for 5 yrs

## 2022-10-04 NOTE — Assessment & Plan Note (Signed)
Clinically stable exam with well controlled BP on metoprolol Tolerating medications without side effects at this time. Pt to continue current regimen and low sodium diet; benefits of regular exercise as able discussed.

## 2022-10-04 NOTE — Assessment & Plan Note (Signed)
Had a 30 min episode of SVT last night which resolved spontaneously She was under some stress and had some abd pain/flank pain.

## 2022-10-04 NOTE — Progress Notes (Signed)
Date:  10/04/2022   Name:  Tammy Acevedo   DOB:  1971/02/22   MRN:  [id number]   Chief Complaint: Annual Exam Tammy Acevedo is a 52 y.o. female who presents today for her Complete Annual Exam. She feels poorly. She reports exercising - none. She reports she is sleeping fairly well. Breast complaints - none.  Mammogram: 08/2022 DEXA: none Pap smear: 06/2020 neg/neg Colonoscopy: 11/2019 repeat 10 yrs  Health Maintenance Due  Topic Date Due   HIV Screening  Never done   INFLUENZA VACCINE  05/01/2022    Immunization History  Administered Date(s) Administered   Influenza,inj,Quad PF,6+ Mos 09/25/2017, 09/26/2018, 09/29/2019, 09/29/2020, 10/03/2021   Moderna Sars-Covid-2 Vaccination 11/04/2019, 12/03/2019   Tdap 12/22/2012   Zoster Recombinat (Shingrix) 10/03/2021, 01/01/2022    Hypertension This is a chronic problem. The problem is controlled. Pertinent negatives include no chest pain, headaches, palpitations or shortness of breath. Past treatments include beta blockers.  Flank Pain This is a new problem. The current episode started yesterday. The quality of the pain is described as aching. Pertinent negatives include no abdominal pain, chest pain, dysuria, fever or headaches. She has tried nothing for the symptoms.    Lab Results  Component Value Date   NA 142 08/09/2022   K 3.6 08/09/2022   CO2 25 08/09/2022   GLUCOSE 108 (H) 08/09/2022   BUN 9 08/09/2022   CREATININE 0.88 08/09/2022   CALCIUM 8.8 (L) 08/09/2022   EGFR 89 10/03/2021   GFRNONAA >60 08/09/2022   Lab Results  Component Value Date   CHOL 146 10/03/2021   HDL 43 10/03/2021   LDLCALC 73 10/03/2021   TRIG 177 (H) 10/03/2021   CHOLHDL 3.4 10/03/2021   Lab Results  Component Value Date   TSH 1.670 10/03/2021   No results found for: "HGBA1C" Lab Results  Component Value Date   WBC 6.1 08/09/2022   HGB 12.4 08/09/2022   HCT 36.7 08/09/2022   MCV 84.6 08/09/2022   PLT 239 08/09/2022    Lab Results  Component Value Date   ALT 22 08/09/2022   AST 27 08/09/2022   ALKPHOS 74 08/09/2022   BILITOT 0.7 08/09/2022   No results found for: "25OHVITD2", "25OHVITD3", "VD25OH"   Review of Systems  Constitutional:  Negative for chills, fatigue and fever.  HENT:  Negative for congestion, hearing loss, tinnitus, trouble swallowing and voice change.   Eyes:  Negative for visual disturbance.  Respiratory:  Negative for cough, chest tightness, shortness of breath and wheezing.   Cardiovascular:  Negative for chest pain, palpitations and leg swelling.  Gastrointestinal:  Negative for abdominal pain, constipation, diarrhea and vomiting.  Endocrine: Negative for polydipsia and polyuria.  Genitourinary:  Positive for flank pain. Negative for dysuria, frequency, genital sores, vaginal bleeding and vaginal discharge.  Musculoskeletal:  Negative for arthralgias, gait problem and joint swelling.  Skin:  Negative for color change and rash.  Neurological:  Negative for dizziness, tremors, light-headedness and headaches.  Hematological:  Negative for adenopathy. Does not bruise/bleed easily.  Psychiatric/Behavioral:  Negative for dysphoric mood and sleep disturbance. The patient is not nervous/anxious.     Patient Active Problem List   Diagnosis Date Noted   Essential hypertension 10/03/2022   Genetic testing 04/06/2020   Family history of throat cancer    Atypical ductal hyperplasia of left breast 08/18/2019   Gastroesophageal reflux disease without esophagitis 12/17/2017   Anxiety 12/17/2017   PSVT (paroxysmal supraventricular tachycardia) 04/04/2017   Bilateral carpal  tunnel syndrome 03/20/2012    No Known Allergies  Past Surgical History:  Procedure Laterality Date   BREAST BIOPSY Left 08/11/2019   distortion, coil marker, radial scar and ADH   BREAST BIOPSY Left 10/07/2019   Procedure: BREAST BIOPSY WITH NEEDLE LOCALIZATION;  Surgeon: Ronny Bacon, MD;  Location: [facility];   Service: General;  Laterality: Left;   BREAST LUMPECTOMY Left 10/07/2019   radial scar, ADH, dense stromal fibrosis excisied, clear surgical margins   CARPAL TUNNEL RELEASE     CATARACT EXTRACTION W/PHACO Right 07/13/2020   Procedure: CATARACT EXTRACTION PHACO AND INTRAOCULAR LENS PLACEMENT ([place]) RIGHT;  Surgeon: Leandrew Koyanagi, MD;  Location: [place];  Service: Ophthalmology;  Laterality: Right;  7.53 1:21.3 9.3%   CATARACT EXTRACTION W/PHACO Left 08/03/2020   Procedure: CATARACT EXTRACTION PHACO AND INTRAOCULAR LENS PLACEMENT ([facility]) LEFT 4.52 00:44.3 10.3%;  Surgeon: Leandrew Koyanagi, MD;  Location: [place];  Service: Ophthalmology;  Laterality: Left;   CHOLECYSTECTOMY     COLONOSCOPY WITH PROPOFOL N/A 12/07/2019   Procedure: COLONOSCOPY WITH PROPOFOL;  Surgeon: Lucilla Lame, MD;  Location: [place];  Service: Endoscopy;  Laterality: N/A;   SVT ABLATION N/A 10/24/2020   Procedure: SVT ABLATION;  Surgeon: Vickie Epley, MD;  Location: [place] [facility];  Service: Cardiovascular;  Laterality: N/A;   TUBAL LIGATION      Social History   Tobacco Use   Smoking status: Never   Smokeless tobacco: Never  Vaping Use   Vaping Use: Never used  Substance Use Topics   Alcohol use: No    Alcohol/week: 0.0 standard drinks of alcohol   Drug use: No     Medication list has been reviewed and updated.  Current Meds  Medication Sig   acetaminophen (TYLENOL) 500 MG tablet Take 1,000 mg by mouth every 6 (six) hours as needed for moderate pain.    levocetirizine (XYZAL) 5 MG tablet Take 5 mg by mouth every evening.   metoprolol tartrate (LOPRESSOR) 25 MG tablet Take 0.5 tablets (12.5 mg total) by mouth 2 (two) times daily.   Multiple Vitamins-Minerals (MULTIVITAMIN WITH MINERALS) tablet Take 1 tablet by mouth daily.   nitrofurantoin, macrocrystal-monohydrate, (MACROBID) 100 MG capsule Take 1 capsule (100 mg total) by mouth 2 (two) times daily for 7  days.   tamoxifen (NOLVADEX) 20 MG tablet Take 1 tablet (20 mg total) by mouth daily.       10/04/2022    9:27 AM 10/03/2021    9:56 AM 03/24/2021    3:53 PM 09/29/2020    9:39 AM  GAD 7 : Generalized Anxiety Score  Nervous, Anxious, on Edge 0 0 0 0  Control/stop worrying 0 0 0 0  Worry too much - different things 0 0 0 0  Trouble relaxing 0 0 0 0  Restless 0 0 0 0  Easily annoyed or irritable 0 0 0 0  Afraid - awful might happen 0 0 0 0  Total GAD 7 Score 0 0 0 0  Anxiety Difficulty Not difficult at all Not difficult at all         10/04/2022    9:27 AM 10/03/2021    9:56 AM 03/24/2021    3:53 PM  Depression screen PHQ 2/9  Decreased Interest 0 0 0  Down, Depressed, Hopeless 0 0 0  PHQ - 2 Score 0 0 0  Altered sleeping 0 1 0  Tired, decreased energy 0 0 0  Change in appetite 0 0 0  Feeling bad or failure about yourself  0 0 0  Trouble concentrating 0 0 0  Moving slowly or fidgety/restless 0 0 0  Suicidal thoughts 0 0 0  PHQ-9 Score 0 1 0  Difficult doing work/chores Not difficult at all Not difficult at all Not difficult at all    BP Readings from Last 3 Encounters:  10/04/22 (!) 100/58  08/15/22 116/60  08/09/22 (!) 141/63    Physical Exam Vitals and nursing note reviewed.  Constitutional:      General: She is not in acute distress.    Appearance: She is well-developed.  HENT:     Head: Normocephalic and atraumatic.     Right Ear: Tympanic membrane and ear canal normal.     Left Ear: Tympanic membrane and ear canal normal.     Nose:     Right Sinus: No maxillary sinus tenderness.     Left Sinus: No maxillary sinus tenderness.  Eyes:     General: No scleral icterus.       Right eye: No discharge.        Left eye: No discharge.     Conjunctiva/sclera: Conjunctivae normal.  Neck:     Thyroid: No thyromegaly.     Vascular: No carotid bruit.  Cardiovascular:     Rate and Rhythm: Normal rate and regular rhythm.     Pulses: Normal pulses.     Heart sounds:  Normal heart sounds.  Pulmonary:     Effort: Pulmonary effort is normal. No respiratory distress.     Breath sounds: No wheezing.  Chest:  Breasts:    Right: No mass, nipple discharge, skin change or tenderness.     Left: No mass, nipple discharge, skin change or tenderness.  Abdominal:     General: Bowel sounds are normal.     Palpations: Abdomen is soft.     Tenderness: There is abdominal tenderness in the suprapubic area. There is no right CVA tenderness, left CVA tenderness, guarding or rebound.  Musculoskeletal:     Cervical back: Normal range of motion. No erythema.     Right lower leg: No edema.     Left lower leg: No edema.  Lymphadenopathy:     Cervical: No cervical adenopathy.  Skin:    General: Skin is warm and dry.     Capillary Refill: Capillary refill takes less than 2 seconds.     Findings: No rash.  Neurological:     Mental Status: She is alert and oriented to person, place, and time.     Cranial Nerves: No cranial nerve deficit.     Sensory: No sensory deficit.     Deep Tendon Reflexes: Reflexes are normal and symmetric.  Psychiatric:        Attention and Perception: Attention normal.        Mood and Affect: Mood normal.     Wt Readings from Last 3 Encounters:  10/04/22 158 lb 3.2 oz (71.8 kg)  08/15/22 163 lb 12.8 oz (74.3 kg)  08/09/22 156 lb (70.8 kg)    BP (!) 100/58   Pulse 71   Temp 98.2 F (36.8 C) (Oral)   Ht _0  (1.676 m)   Wt 158 lb 3.2 oz (71.8 kg)   SpO2 96%   BMI 25.53 kg/m   Assessment and Plan: Problem List Items Addressed This Visit       Cardiovascular and Mediastinum   Essential hypertension (Chronic)    Clinically stable exam with well controlled BP  on metoprolol Tolerating medications without side effects at this time. Pt to continue current regimen and low sodium diet; benefits of regular exercise as able discussed.       Relevant Orders   TSH   PSVT (paroxysmal supraventricular tachycardia) (Chronic)    Had a 30  min episode of SVT last night which resolved spontaneously She was under some stress and had some abd pain/flank pain.        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)    S/p excision 2020 On tamoxifen with minimal side effects with plan to continue for 5 yrs      Other Visit Diagnoses     Annual physical exam    -  Primary   Relevant Orders   Hemoglobin A1c   Lipid panel   TSH   Screening for diabetes mellitus       Relevant Orders   Hemoglobin A1c   Mild hyperlipidemia       Relevant Orders   Lipid panel   Abdominal pain, unspecified abdominal location       Relevant Orders   POCT Urinalysis Dipstick (Completed)   Acute cystitis without hematuria       Relevant Medications   nitrofurantoin, macrocrystal-monohydrate, (MACROBID) 100 MG capsule   Other Relevant Orders   POCT UA - Microscopic Only (Completed)        Partially dictated using Editor, commissioning. Any errors are unintentional.  Halina Maidens, MD [facility]  10/04/2022

## 2022-10-05 LAB — LIPID PANEL
Chol/HDL Ratio: 2.9 ratio (ref 0.0–4.4)
Cholesterol, Total: 127 mg/dL (ref 100–199)
HDL: 44 mg/dL (ref >39)
LDL Chol Calc (NIH): 67 mg/dL (ref 0–99)
Triglycerides: 78 mg/dL (ref 0–149)
VLDL Cholesterol Cal: 16 mg/dL (ref 5–40)

## 2022-10-05 LAB — HEMOGLOBIN A1C
Est. average glucose Bld gHb Est-mCnc: 105 mg/dL
Hgb A1c MFr Bld: 5.3 % (ref 4.8–5.6)

## 2022-10-05 LAB — TSH: TSH: 0.692 u[IU]/mL (ref 0.450–4.500)

## 2022-10-18 ENCOUNTER — Telehealth: Payer: Self-pay | Admitting: Cardiovascular Disease

## 2022-10-18 MED ORDER — METOPROLOL TARTRATE 25 MG PO TABS: 12.5000 mg | ORAL_TABLET | Freq: Two times a day (BID) | ORAL | 1 refills | Status: DC

## 2022-10-18 NOTE — Telephone Encounter (Signed)
Requested Prescriptions   Signed Prescriptions Disp Refills   metoprolol tartrate (LOPRESSOR) 25 MG tablet 90 tablet 1    Sig: Take 0.5 tablets (12.5 mg total) by mouth 2 (two) times daily.    Authorizing Provider: [name]    Ordering User: Britt Bottom

## 2022-10-18 NOTE — Telephone Encounter (Signed)
*  STAT* If patient is at the pharmacy, call can be transferred to refill team.   1. Which medications need to be refilled? (please list name of each medication and dose if known) metoprolol tartrate (LOPRESSOR) 25 MG tablet   2. Which pharmacy/location (including street and city if local pharmacy) is medication to be sent to?  [place], [place]    3. Do they need a 30 day or 90 day supply? [place]

## 2022-11-01 ENCOUNTER — Ambulatory Visit: Payer: No Typology Code available for payment source | Admitting: Family Medicine

## 2022-11-01 ENCOUNTER — Encounter: Payer: Self-pay | Admitting: Family Medicine

## 2022-11-01 VITALS — BP 120/70 | HR 61 | Temp 98.1°F | Ht 66.0 in | Wt 160.0 lb

## 2022-11-01 DIAGNOSIS — J029 Acute pharyngitis, unspecified: Secondary | ICD-10-CM

## 2022-11-01 LAB — POCT RAPID STREP A (OFFICE): Rapid Strep A Screen: NEGATIVE

## 2022-11-01 MED ORDER — AZITHROMYCIN 250 MG PO TABS: ORAL_TABLET | ORAL | 0 refills | Status: AC

## 2022-11-01 NOTE — Progress Notes (Signed)
Date:  11/01/2022   Name:  Tammy Acevedo   DOB:  June 27, 1971   MRN:  [id number]   Chief Complaint: Sore Throat (Sore throat, headache, neck pain and ear pain)  Sore Throat  This is a new problem. The current episode started yesterday. The problem has been gradually worsening. Neither side of throat is experiencing more pain than the other. There has been no fever. The pain is mild. Associated symptoms include a hoarse voice and a plugged ear sensation. Pertinent negatives include no abdominal pain, congestion, coughing, diarrhea, drooling, ear discharge, ear pain, headaches, neck pain, shortness of breath, stridor, swollen glands, trouble swallowing or vomiting. She has had no exposure to strep or mono. She has tried acetaminophen for the symptoms. The treatment provided mild relief.    Lab Results  Component Value Date   NA 142 08/09/2022   K 3.6 08/09/2022   CO2 25 08/09/2022   GLUCOSE 108 (H) 08/09/2022   BUN 9 08/09/2022   CREATININE 0.88 08/09/2022   CALCIUM 8.8 (L) 08/09/2022   EGFR 89 10/03/2021   GFRNONAA >60 08/09/2022   Lab Results  Component Value Date   CHOL 127 10/04/2022   HDL 44 10/04/2022   LDLCALC 67 10/04/2022   TRIG 78 10/04/2022   CHOLHDL 2.9 10/04/2022   Lab Results  Component Value Date   TSH 0.692 10/04/2022   Lab Results  Component Value Date   HGBA1C 5.3 10/04/2022   Lab Results  Component Value Date   WBC 6.1 08/09/2022   HGB 12.4 08/09/2022   HCT 36.7 08/09/2022   MCV 84.6 08/09/2022   PLT 239 08/09/2022   Lab Results  Component Value Date   ALT 22 08/09/2022   AST 27 08/09/2022   ALKPHOS 74 08/09/2022   BILITOT 0.7 08/09/2022   No results found for: "25OHVITD2", "25OHVITD3", "VD25OH"   Review of Systems  Constitutional: Negative.  Negative for chills, fatigue, fever and unexpected weight change.  HENT:  Positive for hoarse voice, postnasal drip, sneezing and sore throat. Negative for congestion, drooling, ear discharge,  ear pain, rhinorrhea, sinus pressure and trouble swallowing.   Respiratory:  Negative for cough, chest tightness, shortness of breath, wheezing and stridor.   Cardiovascular:  Negative for chest pain and palpitations.  Gastrointestinal:  Negative for abdominal pain, blood in stool, constipation, diarrhea, nausea and vomiting.  Genitourinary:  Negative for dysuria, flank pain, frequency, hematuria, urgency and vaginal discharge.  Musculoskeletal:  Positive for myalgias. Negative for arthralgias, back pain and neck pain.  Skin:  Negative for rash.  Neurological:  Negative for dizziness, weakness and headaches.  Hematological:  Negative for adenopathy. Does not bruise/bleed easily.  Psychiatric/Behavioral:  Negative for dysphoric mood. The patient is not nervous/anxious.     Patient Active Problem List   Diagnosis Date Noted   Essential hypertension 10/03/2022   Genetic testing 04/06/2020   Family history of throat cancer    Atypical ductal hyperplasia of left breast 08/18/2019   Gastroesophageal reflux disease without esophagitis 12/17/2017   Anxiety 12/17/2017   PSVT (paroxysmal supraventricular tachycardia) 04/04/2017   Bilateral carpal tunnel syndrome 03/20/2012    No Known Allergies  Past Surgical History:  Procedure Laterality Date   BREAST BIOPSY Left 08/11/2019   distortion, coil marker, radial scar and ADH   BREAST BIOPSY Left 10/07/2019   Procedure: BREAST BIOPSY WITH NEEDLE LOCALIZATION;  Surgeon: Ronny Bacon, MD;  Location: [facility];  Service: General;  Laterality: Left;   BREAST LUMPECTOMY Left  10/07/2019   radial scar, ADH, dense stromal fibrosis excisied, clear surgical margins   CARPAL TUNNEL RELEASE     CATARACT EXTRACTION W/PHACO Right 07/13/2020   Procedure: CATARACT EXTRACTION PHACO AND INTRAOCULAR LENS PLACEMENT ([place]) RIGHT;  Surgeon: Leandrew Koyanagi, MD;  Location: [place];  Service: Ophthalmology;  Laterality: Right;  7.53 1:21.3 9.3%    CATARACT EXTRACTION W/PHACO Left 08/03/2020   Procedure: CATARACT EXTRACTION PHACO AND INTRAOCULAR LENS PLACEMENT ([facility]) LEFT 4.52 00:44.3 10.3%;  Surgeon: Leandrew Koyanagi, MD;  Location: [place];  Service: Ophthalmology;  Laterality: Left;   CHOLECYSTECTOMY     COLONOSCOPY WITH PROPOFOL N/A 12/07/2019   Procedure: COLONOSCOPY WITH PROPOFOL;  Surgeon: Lucilla Lame, MD;  Location: [place];  Service: Endoscopy;  Laterality: N/A;   SVT ABLATION N/A 10/24/2020   Procedure: SVT ABLATION;  Surgeon: Vickie Epley, MD;  Location: [place] [facility];  Service: Cardiovascular;  Laterality: N/A;   TUBAL LIGATION      Social History   Tobacco Use   Smoking status: Never   Smokeless tobacco: Never  Vaping Use   Vaping Use: Never used  Substance Use Topics   Alcohol use: No    Alcohol/week: 0.0 standard drinks of alcohol   Drug use: No     Medication list has been reviewed and updated.  Current Meds  Medication Sig   acetaminophen (TYLENOL) 500 MG tablet Take 1,000 mg by mouth every 6 (six) hours as needed for moderate pain.    levocetirizine (XYZAL) 5 MG tablet Take 5 mg by mouth every evening.   metoprolol tartrate (LOPRESSOR) 25 MG tablet Take 0.5 tablets (12.5 mg total) by mouth 2 (two) times daily.   Multiple Vitamins-Minerals (MULTIVITAMIN WITH MINERALS) tablet Take 1 tablet by mouth daily.   tamoxifen (NOLVADEX) 20 MG tablet Take 1 tablet (20 mg total) by mouth daily.       11/01/2022    4:22 PM 10/04/2022    9:27 AM 10/03/2021    9:56 AM 03/24/2021    3:53 PM  GAD 7 : Generalized Anxiety Score  Nervous, Anxious, on Edge 0 0 0 0  Control/stop worrying 0 0 0 0  Worry too much - different things 0 0 0 0  Trouble relaxing 0 0 0 0  Restless 0 0 0 0  Easily annoyed or irritable 0 0 0 0  Afraid - awful might happen 0 0 0 0  Total GAD 7 Score 0 0 0 0  Anxiety Difficulty Not difficult at all Not difficult at all Not difficult at all        11/01/2022     4:22 PM 10/04/2022    9:27 AM 10/03/2021    9:56 AM  Depression screen PHQ 2/9  Decreased Interest 0 0 0  Down, Depressed, Hopeless 0 0 0  PHQ - 2 Score 0 0 0  Altered sleeping 0 0 1  Tired, decreased energy 0 0 0  Change in appetite 0 0 0  Feeling bad or failure about yourself  0 0 0  Trouble concentrating 0 0 0  Moving slowly or fidgety/restless 0 0 0  Suicidal thoughts 0 0 0  PHQ-9 Score 0 0 1  Difficult doing work/chores Not difficult at all Not difficult at all Not difficult at all    BP Readings from Last 3 Encounters:  11/01/22 120/70  10/04/22 (!) 100/58  08/15/22 116/60    Physical Exam Vitals and nursing note reviewed.  HENT:  Right Ear: No tenderness. No middle ear effusion. Tympanic membrane is retracted. Tympanic membrane is not erythematous.     Left Ear: No tenderness.  No middle ear effusion. Tympanic membrane is retracted. Tympanic membrane is not erythematous.     Nose: Congestion present. No rhinorrhea.     Right Turbinates: Swollen.     Left Turbinates: Swollen.     Mouth/Throat:     Mouth: Mucous membranes are moist.     Pharynx: Posterior oropharyngeal erythema present. No pharyngeal swelling, oropharyngeal exudate or uvula swelling.  Lymphadenopathy:     Head:     Right side of head: Submandibular adenopathy present. No tonsillar adenopathy.     Left side of head: Submandibular adenopathy present. No tonsillar adenopathy.     Cervical:     Right cervical: No superficial, deep or posterior cervical adenopathy.    Left cervical: No superficial, deep or posterior cervical adenopathy.     Comments: Tender submandibular     Wt Readings from Last 3 Encounters:  11/01/22 160 lb (72.6 kg)  10/04/22 158 lb 3.2 oz (71.8 kg)  08/15/22 163 lb 12.8 oz (74.3 kg)    BP 120/70   Pulse 61   Temp 98.1 F (36.7 C) (Oral)   Ht 5\' 6"  (1.676 m)   Wt 160 lb (72.6 kg)   SpO2 98%   BMI 25.82 kg/m   Assessment and Plan: 1. Sore throat New onset.   Persistent.  Gradually worsening over the course of the day.  Strep test was done and it was noted to be negative but the exam is consistent with an erythematous throat palpable submandibular lymph nodes that are tender.  Upper respiratory sinuses are negative to percussion tenderness. - POCT rapid strep A  2. 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 - azithromycin (ZITHROMAX) 250 MG tablet; Take 2 tablets on day 1, then 1 tablet daily on days 2 through 5  Dispense: 6 tablet; Refill: 0     Otilio Miu, MD

## 2022-12-12 ENCOUNTER — Ambulatory Visit: Payer: Managed Care, Other (non HMO) | Admitting: Oncology

## 2023-01-01 ENCOUNTER — Telehealth: Payer: Self-pay | Admitting: Cardiovascular Disease

## 2023-01-01 ENCOUNTER — Other Ambulatory Visit: Payer: Self-pay | Admitting: *Deleted

## 2023-01-01 MED ORDER — TAMOXIFEN CITRATE 20 MG PO TABS: 20.0000 mg | ORAL_TABLET | Freq: Every day | ORAL | 0 refills | Status: DC

## 2023-01-01 NOTE — Telephone Encounter (Signed)
Unable to leave voicemail. Mailbox full

## 2023-01-01 NOTE — Telephone Encounter (Signed)
Pt c/o medication issue:  1. Name of Medication:   metoprolol tartrate (LOPRESSOR) 25 MG tablet    2. How are you currently taking this medication (dosage and times per day)?   Take 0.5 tablets (12.5 mg total) by mouth 2 (two) times daily.    3. Are you having a reaction (difficulty breathing--STAT)? No  4. What is your medication issue? Pt called stating she received a letter from her insurance letting her know that they wont pay for this above medication unless its a 90 day order only. She just wanted to pass the message and make the office aware of the letter. Please advise.

## 2023-01-02 MED ORDER — METOPROLOL TARTRATE 25 MG PO TABS: 12.5000 mg | ORAL_TABLET | Freq: Two times a day (BID) | ORAL | 3 refills | Status: DC

## 2023-01-02 NOTE — Telephone Encounter (Signed)
Pt called stating she received a letter from her insurance letting her know that they wont pay for this above medication unless its a 90 day order only. She just wanted to pass the message and make the office aware of the letter. Please advise.   Rx sent to pharmacy of choice for 90 days plus refills

## 2023-01-21 ENCOUNTER — Encounter: Payer: Self-pay | Admitting: Oncology

## 2023-01-21 ENCOUNTER — Inpatient Hospital Stay: Payer: No Typology Code available for payment source | Attending: Oncology | Admitting: Oncology

## 2023-01-21 VITALS — BP 107/68 | HR 65 | Temp 97.5°F | Resp 18 | Ht 66.0 in | Wt 158.0 lb

## 2023-01-21 DIAGNOSIS — Z79810 Long term (current) use of selective estrogen receptor modulators (SERMs): Secondary | ICD-10-CM | POA: Insufficient documentation

## 2023-01-21 DIAGNOSIS — N6092 Unspecified benign mammary dysplasia of left breast: Secondary | ICD-10-CM | POA: Insufficient documentation

## 2023-01-21 DIAGNOSIS — Z5181 Encounter for therapeutic drug level monitoring: Secondary | ICD-10-CM | POA: Diagnosis not present

## 2023-01-21 NOTE — Progress Notes (Signed)
Patient states that he breast ache sometimes that comes and goes.

## 2023-01-21 NOTE — Progress Notes (Signed)
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  She recently underwent a screening mammogram in October 2020 with which showed possible architectural distortion in the left breast as well as a possible mass in the right breast.  Ultrasound of the right breast showed a benign cyst in the right breast.  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  Patient was seen by Dr. Claudine Mouton and she underwent a lumpectomy which showed a radial scar with florid ductal hyperplasia and sclerosing adenosis.  Normal residual atypical proliferative breast disease or evidence of DCIS or invasive malignancy.  Margins were negative   Risks and benefits of chemoprevention for atypical ductal hyperplasia but tamoxifen was discussed.  Patient started taking tamoxifen in August 2021    Interval history-tolerating tamoxifen well and without any significant side effects.  Occasional self-limited hot flashes.  States that her left arm tends to become numb especially at night when she sleeps and she has to wake up and shake it for the sensation to go away.  She has not had these issues during the day.  ECOG PS-  0 Pain scale- 0   Review of systems- Review of Systems  Constitutional:  Negative for chills, fever, malaise/fatigue and weight loss.  HENT:  Negative for congestion, ear discharge and nosebleeds.   Eyes:  Negative for blurred vision.  Respiratory:  Negative for cough, hemoptysis, sputum production, shortness of breath and wheezing.   Cardiovascular:  Negative for chest pain, palpitations, orthopnea and claudication.  Gastrointestinal:  Negative for abdominal pain, blood in stool, constipation, diarrhea, heartburn, melena, nausea and vomiting.  Genitourinary:  Negative for dysuria, flank pain, frequency, hematuria and urgency.  Musculoskeletal:  Negative for back pain, joint pain and myalgias.  Skin:  Negative for rash.  Neurological:  Negative for dizziness, tingling, focal weakness, seizures, weakness and headaches.  Endo/Heme/Allergies:  Does not bruise/bleed easily.  Psychiatric/Behavioral:  Negative for depression and suicidal ideas. The patient does not have insomnia.       No Known Allergies   Past Medical History:  Diagnosis Date   AVNRT (AV nodal re-entry tachycardia)    a. 10/2020 s/p EPS and RFCA.   Chest pain    a. 08/2015 ETT: Ex time 9:01, Max HR 166, No ECG changes->Nl study.   COVID-19 08/31/2019   Family history of breast cancer    Family history of lung cancer    Family history of throat cancer    Hypertension    Mild Mitral regurgitation    a. 08/2015 Echo: EF 50%, no rwma, Triv TR/PR, mild MR; b. 10/2020 Echo: EF 60-65%, no rwma, nl RV size/fxn, no MR/MS.   SVT (supraventricular tachycardia) 04/04/2017   Started On beta blocker; followed by Dr. Alvino Chapel S/p Ablation 10/2020 - off of all  medications   Wears dentures    partial upper     Past Surgical History:  Procedure Laterality Date   BREAST BIOPSY Left 08/11/2019   distortion, coil marker, radial scar and ADH   BREAST BIOPSY Left 10/07/2019   Procedure: BREAST BIOPSY WITH NEEDLE LOCALIZATION;  Surgeon:  Campbell Lerner, MD;  Location: [facility] ORS;  Service: General;  Laterality: Left;   BREAST LUMPECTOMY Left 10/07/2019   radial scar, ADH, dense stromal fibrosis excisied, clear surgical margins   CARPAL TUNNEL RELEASE     CATARACT EXTRACTION W/PHACO Right 07/13/2020   Procedure: CATARACT EXTRACTION PHACO AND INTRAOCULAR LENS PLACEMENT (IOC) RIGHT;  Surgeon: Lockie Mola, MD;  Location: [facility];  Service: Ophthalmology;  Laterality: Right;  7.53 1:21.3 9.3%   CATARACT EXTRACTION W/PHACO Left 08/03/2020   Procedure: CATARACT EXTRACTION PHACO AND INTRAOCULAR LENS PLACEMENT (IOC) LEFT 4.52 00:44.3 10.3%;  Surgeon: Lockie Mola, MD;  Location: [facility];  Service: Ophthalmology;  Laterality: Left;   CHOLECYSTECTOMY     COLONOSCOPY WITH PROPOFOL N/A 12/07/2019   Procedure: COLONOSCOPY WITH PROPOFOL;  Surgeon: Midge Minium, MD;  Location: [facility];  Service: Endoscopy;  Laterality: N/A;   SVT ABLATION N/A 10/24/2020   Procedure: SVT ABLATION;  Surgeon: Lanier Prude, MD;  Location: [facility] INVASIVE CV LAB;  Service: Cardiovascular;  Laterality: N/A;   TUBAL LIGATION      Social History   Socioeconomic History   Marital status: Married    Spouse name: Not on file   Number of children: 2   Years of education: Not on file   Highest education level: Not on file  Occupational History   Not on file  Tobacco Use   Smoking status: Never   Smokeless tobacco: Never  Vaping Use   Vaping Use: Never used  Substance and Sexual Activity   Alcohol use: No    Alcohol/week: 0.0 standard drinks of alcohol   Drug use: No   Sexual activity: Not Currently  Other Topics Concern   Not on file  Social History Narrative   Not on file   Social Determinants of Health   Financial Resource Strain: Low Risk  (09/25/2017)   Overall Financial Resource Strain (CARDIA)    Difficulty of Paying Living Expenses: Not hard at all  Food Insecurity: No Food Insecurity  (09/25/2017)   Hunger Vital Sign    Worried About Running Out of Food in the Last Year: Never true    Ran Out of Food in the Last Year: Never true  Transportation Needs: No Transportation Needs (09/25/2017)   PRAPARE - Administrator, Civil Service (Medical): No    Lack of Transportation (Non-Medical): No  Physical Activity: Inactive (09/25/2017)   Exercise Vital Sign    Days of Exercise per Week: 0 days    Minutes of Exercise per Session: 0 min  Stress: No Stress Concern Present (09/25/2017)   [employer] of Occupational Health - Occupational Stress Questionnaire    Feeling of Stress : Not at all  Social Connections: Moderately Integrated (09/25/2017)   Social Connection and Isolation Panel [NHANES]    Frequency of Communication with Friends and Family: More than three times a week    Frequency of Social Gatherings with Friends and Family: Three times a week    Attends Religious Services: More than 4 times per year    Active Member of Clubs or Organizations: No    Attends Banker Meetings: Never    Marital  Status: Married  Catering manager Violence: Not At Risk (09/25/2017)   Humiliation, Afraid, Rape, and Kick questionnaire    Fear of Current or Ex-Partner: No    Emotionally Abused: No    Physically Abused: No    Sexually Abused: No    Family History  Problem Relation Age of Onset   Diabetes Mother    COPD Father    Lung cancer Father 76   Breast cancer Maternal Aunt 36       dx again at 79~metastatic   Cancer Paternal Aunt        jaw cancer dx 49s   Breast cancer Cousin        dx 50s-60s. MAT that had breast cancer's daughter   Throat cancer Cousin      Current Outpatient Medications:    acetaminophen (TYLENOL) 500 MG tablet, Take 1,000 mg by mouth every 6 (six) hours as needed for moderate pain. , Disp: , Rfl:    levocetirizine (XYZAL) 5 MG tablet, Take 5 mg by mouth every evening., Disp: , Rfl:    metoprolol tartrate (LOPRESSOR) 25  MG tablet, Take 0.5 tablets (12.5 mg total) by mouth 2 (two) times daily., Disp: 90 tablet, Rfl: 3   Multiple Vitamins-Minerals (MULTIVITAMIN WITH MINERALS) tablet, Take 1 tablet by mouth daily., Disp: , Rfl:    tamoxifen (NOLVADEX) 20 MG tablet, Take 1 tablet (20 mg total) by mouth daily., Disp: 90 tablet, Rfl: 0  Physical exam:  Vitals:   01/21/23 0949  BP: 107/68  Pulse: 65  Resp: 18  Temp: (!) 97.5 F (36.4 C)  TempSrc: Tympanic  SpO2: 99%  Weight: 158 lb (71.7 kg)  Height: 5\' 6"  (1.676 m)   Physical Exam Cardiovascular:     Rate and Rhythm: Normal rate and regular rhythm.     Heart sounds: Normal heart sounds.  Pulmonary:     Effort: Pulmonary effort is normal.     Breath sounds: Normal breath sounds.  Abdominal:     General: Bowel sounds are normal.     Palpations: Abdomen is soft.  Skin:    General: Skin is warm and dry.  Neurological:     Mental Status: She is alert and oriented to person, place, and time.    Breast exam was performed in seated and lying down position. Patient is status post left lumpectomy with a well-healed surgical scar. No evidence of any palpable masses. No evidence of axillary adenopathy. No evidence of any palpable masses or lumps in the right breast. No evidence of right axillary adenopathy      Latest Ref Rng & Units 08/09/2022   12:49 PM  CMP  Glucose 70 - 99 mg/dL 161   BUN 6 - 20 mg/dL 9   Creatinine 0.96 - 0.45 mg/dL 4.09   Sodium 811 - 914 mmol/L 142   Potassium 3.5 - 5.1 mmol/L 3.6   Chloride 98 - 111 mmol/L 109   CO2 22 - 32 mmol/L 25   Calcium 8.9 - 10.3 mg/dL 8.8   Total Protein 6.5 - 8.1 g/dL 6.9   Total Bilirubin 0.3 - 1.2 mg/dL 0.7   Alkaline Phos 38 - 126 U/L 74   AST 15 - 41 U/L 27   ALT 0 - 44 U/L 22       Latest Ref Rng & Units 08/09/2022   12:49 PM  CBC  WBC 4.0 - 10.5 K/uL 6.1   Hemoglobin 12.0 - 15.0 g/dL 78.2   Hematocrit 95.6 - 46.0 %  36.7   Platelets 150 - 400 K/uL 239      Assessment and plan-  Patient is a 52 y.o. female with history of ADH on tamoxifen here for routine follow-up  Clinically patient is doing well with no concerning findings on breast exam.  She is tolerating tamoxifen well and will continue to take it until August 2026.  She has been postmenopausal now for the last 3 years.  I will obtain a baseline bone density scan at this time.  I have encouraged her to take preventative calcium 1200 mg along with vitamin D 800 international units daily.  I will see her back in 6 months no labs.  Her mammograms are being coordinated by GYN   Visit Diagnosis 1. Atypical ductal hyperplasia of left breast   2. Encounter for monitoring tamoxifen therapy      Dr. Owens Shark, MD, MPH [facility] at [facility] [phone] 01/21/2023 11:10 AM

## 2023-02-15 DIAGNOSIS — L259 Unspecified contact dermatitis, unspecified cause: Secondary | ICD-10-CM | POA: Diagnosis not present

## 2023-03-15 DIAGNOSIS — H52203 Unspecified astigmatism, bilateral: Secondary | ICD-10-CM | POA: Diagnosis not present

## 2023-03-15 DIAGNOSIS — L259 Unspecified contact dermatitis, unspecified cause: Secondary | ICD-10-CM | POA: Diagnosis not present

## 2023-03-15 DIAGNOSIS — Z961 Presence of intraocular lens: Secondary | ICD-10-CM | POA: Diagnosis not present

## 2023-03-27 ENCOUNTER — Other Ambulatory Visit: Payer: No Typology Code available for payment source

## 2023-03-29 ENCOUNTER — Other Ambulatory Visit: Payer: Self-pay | Admitting: Oncology

## 2023-06-26 DIAGNOSIS — Z1331 Encounter for screening for depression: Secondary | ICD-10-CM | POA: Diagnosis not present

## 2023-06-26 DIAGNOSIS — Z01419 Encounter for gynecological examination (general) (routine) without abnormal findings: Secondary | ICD-10-CM | POA: Diagnosis not present

## 2023-07-09 ENCOUNTER — Other Ambulatory Visit: Payer: Self-pay | Admitting: Oncology

## 2023-07-23 ENCOUNTER — Inpatient Hospital Stay: Payer: 59 | Admitting: Oncology

## 2023-08-05 ENCOUNTER — Other Ambulatory Visit: Payer: Self-pay | Admitting: Obstetrics and Gynecology

## 2023-08-05 ENCOUNTER — Encounter: Payer: Self-pay | Admitting: Oncology

## 2023-08-05 ENCOUNTER — Inpatient Hospital Stay: Payer: 59 | Attending: Oncology | Admitting: Oncology

## 2023-08-05 VITALS — BP 112/76 | HR 75 | Temp 96.8°F | Resp 18 | Ht 66.0 in | Wt 155.6 lb

## 2023-08-05 DIAGNOSIS — Z5181 Encounter for therapeutic drug level monitoring: Secondary | ICD-10-CM

## 2023-08-05 DIAGNOSIS — Z79810 Long term (current) use of selective estrogen receptor modulators (SERMs): Secondary | ICD-10-CM | POA: Insufficient documentation

## 2023-08-05 DIAGNOSIS — N6092 Unspecified benign mammary dysplasia of left breast: Secondary | ICD-10-CM | POA: Diagnosis not present

## 2023-08-05 DIAGNOSIS — Z1231 Encounter for screening mammogram for malignant neoplasm of breast: Secondary | ICD-10-CM

## 2023-08-05 NOTE — Progress Notes (Signed)
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  She recently underwent a screening mammogram in October 2020 with which showed possible architectural distortion in the left breast as well as a possible mass in the right breast.  Ultrasound of the right breast showed a benign cyst in the right breast.  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  Patient was seen by Dr. Claudine Mouton and she underwent a lumpectomy which showed a radial scar with florid ductal hyperplasia and sclerosing adenosis.  Normal residual atypical proliferative breast disease or evidence of DCIS or invasive malignancy.  Margins were negative   Risks and benefits of chemoprevention for atypical ductal hyperplasia but tamoxifen was discussed.  Patient started taking tamoxifen in August 2021    Interval history-tolerating tamoxifen well so far.  Denies any significant side effects.  Denies any breast concerns presently.  ECOG PS- 1 Pain scale- 0   Review of systems- Review of Systems  Constitutional:  Negative for chills, fever, malaise/fatigue and weight loss.  HENT:  Negative for congestion, ear discharge and  nosebleeds.   Eyes:  Negative for blurred vision.  Respiratory:  Negative for cough, hemoptysis, sputum production, shortness of breath and wheezing.   Cardiovascular:  Negative for chest pain, palpitations, orthopnea and claudication.  Gastrointestinal:  Negative for abdominal pain, blood in stool, constipation, diarrhea, heartburn, melena, nausea and vomiting.  Genitourinary:  Negative for dysuria, flank pain, frequency, hematuria and urgency.  Musculoskeletal:  Negative for back pain, joint pain and myalgias.  Skin:  Negative for rash.  Neurological:  Negative for dizziness, tingling, focal weakness, seizures, weakness and headaches.  Endo/Heme/Allergies:  Does not bruise/bleed easily.  Psychiatric/Behavioral:  Negative for depression and suicidal ideas. The patient does not have insomnia.       No Known Allergies   Past Medical History:  Diagnosis Date   AVNRT (AV nodal re-entry tachycardia) (HCC)    a. 10/2020 s/p EPS and RFCA.   Chest pain    a. 08/2015 ETT: Ex time 9:01, Max HR 166, No ECG changes->Nl study.   COVID-19 08/31/2019   Family history of breast cancer    Family history of lung cancer    Family history of throat cancer    Hypertension    Mild Mitral regurgitation    a. 08/2015 Echo: EF 50%, no rwma, Triv TR/PR, mild MR; b. 10/2020 Echo: EF 60-65%, no rwma, nl RV size/fxn, no MR/MS.   SVT (supraventricular tachycardia) (HCC) 04/04/2017   Started On beta blocker; followed by Dr. Alvino Chapel S/p Ablation 10/2020 - off of all medications   Wears dentures    partial upper     Past Surgical History:  Procedure Laterality Date   BREAST BIOPSY Left 08/11/2019   distortion, coil marker, radial scar  and ADH   BREAST BIOPSY Left 10/07/2019   Procedure: BREAST BIOPSY WITH NEEDLE LOCALIZATION;  Surgeon: Campbell Lerner, MD;  Location: [facility] ORS;  Service: General;  Laterality: Left;   BREAST LUMPECTOMY Left 10/07/2019   radial scar, ADH, dense stromal fibrosis excisied, clear  surgical margins   CARPAL TUNNEL RELEASE     CATARACT EXTRACTION W/PHACO Right 07/13/2020   Procedure: CATARACT EXTRACTION PHACO AND INTRAOCULAR LENS PLACEMENT (IOC) RIGHT;  Surgeon: Lockie Mola, MD;  Location: [facility];  Service: Ophthalmology;  Laterality: Right;  7.53 1:21.3 9.3%   CATARACT EXTRACTION W/PHACO Left 08/03/2020   Procedure: CATARACT EXTRACTION PHACO AND INTRAOCULAR LENS PLACEMENT (IOC) LEFT 4.52 00:44.3 10.3%;  Surgeon: Lockie Mola, MD;  Location: [facility];  Service: Ophthalmology;  Laterality: Left;   CHOLECYSTECTOMY     COLONOSCOPY WITH PROPOFOL N/A 12/07/2019   Procedure: COLONOSCOPY WITH PROPOFOL;  Surgeon: Midge Minium, MD;  Location: [facility];  Service: Endoscopy;  Laterality: N/A;   SVT ABLATION N/A 10/24/2020   Procedure: SVT ABLATION;  Surgeon: Lanier Prude, MD;  Location: [facility] INVASIVE CV LAB;  Service: Cardiovascular;  Laterality: N/A;   TUBAL LIGATION      Social History   Socioeconomic History   Marital status: Married    Spouse name: Not on file   Number of children: 2   Years of education: Not on file   Highest education level: Not on file  Occupational History   Not on file  Tobacco Use   Smoking status: Never   Smokeless tobacco: Never  Vaping Use   Vaping status: Never Used  Substance and Sexual Activity   Alcohol use: No    Alcohol/week: 0.0 standard drinks of alcohol   Drug use: No   Sexual activity: Not Currently  Other Topics Concern   Not on file  Social History Narrative   Not on file   Social Determinants of Health   Financial Resource Strain: Low Risk  (09/25/2017)   Overall Financial Resource Strain (CARDIA)    Difficulty of Paying Living Expenses: Not hard at all  Food Insecurity: No Food Insecurity (09/25/2017)   Hunger Vital Sign    Worried About Running Out of Food in the Last Year: Never true    Ran Out of Food in the Last Year: Never true  Transportation Needs: No  Transportation Needs (06/26/2023)   Received from [facility]   PRAPARE - Transportation    In the past 12 months, has lack of transportation kept you from medical appointments or from getting medications?: No    Lack of Transportation (Non-Medical): No  Physical Activity: Inactive (09/25/2017)   Exercise Vital Sign    Days of Exercise per Week: 0 days    Minutes of Exercise per Session: 0 min  Stress: No Stress Concern Present (09/25/2017)   [employer] of Occupational Health - Occupational Stress Questionnaire    Feeling of Stress : Not at all  Social Connections: Moderately Integrated (09/25/2017)   Social Connection and Isolation Panel [NHANES]    Frequency of Communication with Friends and Family: More than three times a week    Frequency of Social Gatherings with Friends and Family: Three times a week    Attends Religious Services: More than 4 times per year    Active Member of Clubs or Organizations: No    Attends Banker Meetings: Never    Marital Status: Married  Catering manager Violence: Not At Risk (09/25/2017)  Humiliation, Afraid, Rape, and Kick questionnaire    Fear of Current or Ex-Partner: No    Emotionally Abused: No    Physically Abused: No    Sexually Abused: No    Family History  Problem Relation Age of Onset   Diabetes Mother    COPD Father    Lung cancer Father 93   Breast cancer Maternal Aunt 12       dx again at 79~metastatic   Cancer Paternal Aunt        jaw cancer dx 25s   Breast cancer Cousin        dx 50s-60s. MAT that had breast cancer's daughter   Throat cancer Cousin      Current Outpatient Medications:    acetaminophen (TYLENOL) 500 MG tablet, Take 1,000 mg by mouth every 6 (six) hours as needed for moderate pain. , Disp: , Rfl:    levocetirizine (XYZAL) 5 MG tablet, Take 5 mg by mouth every evening., Disp: , Rfl:    metoprolol tartrate (LOPRESSOR) 25 MG tablet, Take 0.5 tablets (12.5 mg total) by  mouth 2 (two) times daily., Disp: 90 tablet, Rfl: 3   Multiple Vitamins-Minerals (MULTIVITAMIN WITH MINERALS) tablet, Take 1 tablet by mouth daily., Disp: , Rfl:    tamoxifen (NOLVADEX) 20 MG tablet, Take 1 tablet by mouth once daily, Disp: 90 tablet, Rfl: 0  Physical exam:  Vitals:   08/05/23 0958  BP: 112/76  Pulse: 75  Resp: 18  Temp: (!) 96.8 F (36 C)  TempSrc: Tympanic  SpO2: 94%  Weight: 155 lb 9.6 oz (70.6 kg)  Height: 5\' 6"  (1.676 m)   Physical Exam Cardiovascular:     Rate and Rhythm: Normal rate and regular rhythm.     Heart sounds: Normal heart sounds.  Pulmonary:     Effort: Pulmonary effort is normal.     Breath sounds: Normal breath sounds.  Abdominal:     General: Bowel sounds are normal.     Palpations: Abdomen is soft.  Skin:    General: Skin is warm and dry.  Neurological:     Mental Status: She is alert and oriented to person, place, and time.    Breast exam was performed in seated and lying down position. Patient is status post left lumpectomy with a well-healed surgical scar. No evidence of any palpable masses. No evidence of axillary adenopathy. No evidence of any palpable masses or lumps in the right breast. No evidence of right axillary adenopathy      Latest Ref Rng & Units 08/09/2022   12:49 PM  CMP  Glucose 70 - 99 mg/dL 132   BUN 6 - 20 mg/dL 9   Creatinine 4.40 - 1.02 mg/dL 7.25   Sodium 366 - 440 mmol/L 142   Potassium 3.5 - 5.1 mmol/L 3.6   Chloride 98 - 111 mmol/L 109   CO2 22 - 32 mmol/L 25   Calcium 8.9 - 10.3 mg/dL 8.8   Total Protein 6.5 - 8.1 g/dL 6.9   Total Bilirubin 0.3 - 1.2 mg/dL 0.7   Alkaline Phos 38 - 126 U/L 74   AST 15 - 41 U/L 27   ALT 0 - 44 U/L 22       Latest Ref Rng & Units 08/09/2022   12:49 PM  CBC  WBC 4.0 - 10.5 K/uL 6.1   Hemoglobin 12.0 - 15.0 g/dL 34.7   Hematocrit 42.5 - 46.0 % 36.7   Platelets 150 - 400 K/uL 239  Assessment and plan- Patient is a 52 y.o. female with history of ADH on  tamoxifen here for routine follow-up  Clinically patient is doing well with no concerning signs and symptoms of breast cancer based on today's Exam.  She would be due for a mammogram in December 2024 which is scheduled.  She is tolerating tamoxifen well and will continue taking it for 5 years ending in August 2026.  I will plan to get a bone density scan at this time.   Visit Diagnosis 1. Encounter for monitoring tamoxifen therapy   2. Atypical ductal hyperplasia of left breast      Dr. Owens Shark, MD, MPH [facility] at [facility] [phone] 08/05/2023 12:05 PM

## 2023-09-05 ENCOUNTER — Ambulatory Visit
Admission: RE | Admit: 2023-09-05 | Discharge: 2023-09-05 | Disposition: A | Discharge status: Home / Self Care | Payer: 59 | Source: Ambulatory Visit | Attending: Obstetrics and Gynecology | Admitting: Obstetrics and Gynecology

## 2023-09-05 DIAGNOSIS — Z1231 Encounter for screening mammogram for malignant neoplasm of breast: Secondary | ICD-10-CM | POA: Insufficient documentation

## 2023-10-10 ENCOUNTER — Other Ambulatory Visit: Payer: Self-pay | Admitting: Oncology

## 2023-10-22 ENCOUNTER — Ambulatory Visit
Admission: RE | Admit: 2023-10-22 | Discharge: 2023-10-22 | Disposition: A | Discharge status: Home / Self Care | Payer: 59 | Source: Ambulatory Visit | Attending: Oncology | Admitting: Oncology

## 2023-10-22 DIAGNOSIS — N6092 Unspecified benign mammary dysplasia of left breast: Secondary | ICD-10-CM | POA: Diagnosis not present

## 2023-10-28 ENCOUNTER — Ambulatory Visit
Admission: EM | Admit: 2023-10-28 | Discharge: 2023-10-28 | Disposition: A | Discharge status: Home / Self Care | Payer: 59 | Attending: Emergency Medicine | Admitting: Emergency Medicine

## 2023-10-28 ENCOUNTER — Encounter: Payer: Self-pay | Admitting: Emergency Medicine

## 2023-10-28 ENCOUNTER — Other Ambulatory Visit: Payer: Self-pay

## 2023-10-28 DIAGNOSIS — U071 COVID-19: Secondary | ICD-10-CM

## 2023-10-28 LAB — POC COVID19/FLU A&B COMBO
Covid Antigen, POC: POSITIVE — AB
Influenza A Antigen, POC: NEGATIVE
Influenza B Antigen, POC: NEGATIVE

## 2023-10-28 NOTE — Discharge Instructions (Signed)
Your symptoms today are most likely being caused by a virus and should steadily improve in time it can take up to 7 to 10 days before you truly start to see a turnaround however things will get better    You can take Tylenol and/or Ibuprofen as needed for fever reduction and pain relief.   For cough: honey 1/2 to 1 teaspoon (you can dilute the honey in water or another fluid).  You can also use guaifenesin and dextromethorphan for cough. You can use a humidifier for chest congestion and cough.  If you don't have a humidifier, you can sit in the bathroom with the hot shower running.      For sore throat: try warm salt water gargles, cepacol lozenges, throat spray, warm tea or water with lemon/honey, popsicles or ice, or OTC cold relief medicine for throat discomfort.   For congestion: take a daily anti-histamine like Zyrtec, Claritin, and a oral decongestant, such as pseudoephedrine.  You can also use Flonase 1-2 sprays in each nostril daily.   It is important to stay hydrated: drink plenty of fluids (water, gatorade/powerade/pedialyte, juices, or teas) to keep your throat moisturized and help further relieve irritation/discomfort.

## 2023-10-28 NOTE — ED Triage Notes (Addendum)
Scratchy throat and sneezing for two days.  Has throat drainage and coughs.    Has had tylenol day and night  Family member has been sick recently

## 2023-10-28 NOTE — ED Provider Notes (Addendum)
[facility]    CSN: [id number] Arrival date & time: 10/28/23  1017      History   Chief Complaint No chief complaint on file.   HPI Tammy Acevedo is a 53 y.o. female.   Patient presents for evaluation of subjective fever, chills, scratchy throat, sneezing, pain present behind the ears radiating into the neck present for 2 days.  Known sick contact.  Tolerating food and liquids.  Has attempted use of Tylenol cold and flu day and night.  Denies shortness of breath and wheezing.  Blood pressure 122/76, O2 saturation 97% on room air, temperature 99.0, pulse 78, respirations 20  Past Medical History:  Diagnosis Date   AVNRT (AV nodal re-entry tachycardia) (HCC)    a. 10/2020 s/p EPS and RFCA.   Chest pain    a. 08/2015 ETT: Ex time 9:01, Max HR 166, No ECG changes->Nl study.   COVID-19 08/31/2019   Family history of breast cancer    Family history of lung cancer    Family history of throat cancer    Hypertension    Mild Mitral regurgitation    a. 08/2015 Echo: EF 50%, no rwma, Triv TR/PR, mild MR; b. 10/2020 Echo: EF 60-65%, no rwma, nl RV size/fxn, no MR/MS.   SVT (supraventricular tachycardia) (HCC) 04/04/2017   Started On beta blocker; followed by Dr. Alvino Chapel S/p Ablation 10/2020 - off of all medications   Wears dentures    partial upper    Patient Active Problem List   Diagnosis Date Noted   Essential hypertension 10/03/2022   Genetic testing 04/06/2020   Family history of throat cancer    Atypical ductal hyperplasia of left breast 08/18/2019   Gastroesophageal reflux disease without esophagitis 12/17/2017   Anxiety 12/17/2017   PSVT (paroxysmal supraventricular tachycardia) (HCC) 04/04/2017   Bilateral carpal tunnel syndrome 03/20/2012    Past Surgical History:  Procedure Laterality Date   BREAST BIOPSY Left 08/11/2019   distortion, coil marker, radial scar and ADH   BREAST BIOPSY Left 10/07/2019   Procedure: BREAST BIOPSY WITH NEEDLE LOCALIZATION;   Surgeon: Campbell Lerner, MD;  Location: [facility];  Service: General;  Laterality: Left;   BREAST LUMPECTOMY Left 10/07/2019   radial scar, ADH, dense stromal fibrosis excisied, clear surgical margins   CARPAL TUNNEL RELEASE     CATARACT EXTRACTION W/PHACO Right 07/13/2020   Procedure: CATARACT EXTRACTION PHACO AND INTRAOCULAR LENS PLACEMENT (IOC) RIGHT;  Surgeon: Lockie Mola, MD;  Location: [facility];  Service: Ophthalmology;  Laterality: Right;  7.53 1:21.3 9.3%   CATARACT EXTRACTION W/PHACO Left 08/03/2020   Procedure: CATARACT EXTRACTION PHACO AND INTRAOCULAR LENS PLACEMENT (IOC) LEFT 4.52 00:44.3 10.3%;  Surgeon: Lockie Mola, MD;  Location: [facility];  Service: Ophthalmology;  Laterality: Left;   CHOLECYSTECTOMY     COLONOSCOPY WITH PROPOFOL N/A 12/07/2019   Procedure: COLONOSCOPY WITH PROPOFOL;  Surgeon: Midge Minium, MD;  Location: [facility];  Service: Endoscopy;  Laterality: N/A;   SVT ABLATION N/A 10/24/2020   Procedure: SVT ABLATION;  Surgeon: Lanier Prude, MD;  Location: [facility];  Service: Cardiovascular;  Laterality: N/A;   TUBAL LIGATION      OB History   No obstetric history on file.      Home Medications    Prior to Admission medications   Medication Sig Start Date End Date Taking? Authorizing Provider  acetaminophen (TYLENOL) 500 MG tablet Take 1,000 mg by mouth every 6 (six) hours as needed for moderate  pain.     [provider]  levocetirizine (XYZAL) 5 MG tablet Take 5 mg by mouth every evening.    [provider]  metoprolol tartrate (LOPRESSOR) 25 MG tablet Take 0.5 tablets (12.5 mg total) by mouth 2 (two) times daily. 01/02/23 12/28/23  Antonieta Iba, MD  Multiple Vitamins-Minerals (MULTIVITAMIN WITH MINERALS) tablet Take 1 tablet by mouth daily.    [provider]  tamoxifen (NOLVADEX) 20 MG tablet Take 1 tablet by mouth once daily 10/10/23   Creig Hines, MD    Family  History Family History  Problem Relation Age of Onset   Diabetes Mother    COPD Father    Lung cancer Father 6   Breast cancer Maternal Aunt 58       dx again at 79~metastatic   Cancer Paternal Aunt        jaw cancer dx 75s   Breast cancer Cousin        dx 50s-60s. MAT that had breast cancer's daughter   Throat cancer Cousin     Social History Social History   Tobacco Use   Smoking status: Never   Smokeless tobacco: Never  Vaping Use   Vaping status: Never Used  Substance Use Topics   Alcohol use: No    Alcohol/week: 0.0 standard drinks of alcohol   Drug use: No     Allergies   Patient has no known allergies.   Review of Systems Review of Systems   Physical Exam Triage Vital Signs ED Triage Vitals  Encounter Vitals Group     BP      Systolic BP Percentile      Diastolic BP Percentile      Pulse      Resp      Temp      Temp src      SpO2      Weight      Height      Head Circumference      Peak Flow      Pain Score      Pain Loc      Pain Education      Exclude from Growth Chart    No data found.  Updated Vital Signs There were no vitals taken for this visit.  Visual Acuity Right Eye Distance:   Left Eye Distance:   Bilateral Distance:    Right Eye Near:   Left Eye Near:    Bilateral Near:     Physical Exam Constitutional:      Appearance: Normal appearance.  HENT:     Head: Normocephalic.     Right Ear: Tympanic membrane, ear canal and external ear normal.     Left Ear: Tympanic membrane, ear canal and external ear normal.     Nose: Congestion present. No rhinorrhea.     Mouth/Throat:     Pharynx: No oropharyngeal exudate or posterior oropharyngeal erythema.  Eyes:     Extraocular Movements: Extraocular movements intact.  Cardiovascular:     Rate and Rhythm: Normal rate and regular rhythm.     Pulses: Normal pulses.     Heart sounds: Normal heart sounds.  Pulmonary:     Effort: Pulmonary effort is normal.     Breath sounds:  Normal breath sounds.  Neurological:     Mental Status: She is alert and oriented to person, place, and time. Mental status is at baseline.      UC Treatments / Results  Labs (all labs  ordered are listed, but only abnormal results are displayed) Labs Reviewed  POC COVID19/FLU A&B COMBO    EKG   Radiology No results found.  Procedures Procedures (including critical care time)  Medications Ordered in UC Medications - No data to display  Initial Impression / Assessment and Plan / UC Course  I have reviewed the triage vital signs and the nursing notes.  Pertinent labs & imaging results that were available during my care of the patient were reviewed by me and considered in my medical decision making (see chart for details).  COVID-19  Patient is in no signs of distress nor toxic appearing.  Vital signs are stable.  Low suspicion for pneumonia, pneumothorax or bronchitis and therefore will defer imaging.  Healthy adult minimal comorbidity, does not qualify for antiviral.  Discussed quarantine per CDC and work note given.May use additional over-the-counter medications as needed for supportive care.  May follow-up with urgent care as needed if symptoms persist or worsen.    Final Clinical Impressions(s) / UC Diagnoses   Final diagnoses:  None   Discharge Instructions   None    ED Prescriptions   None    PDMP not reviewed this encounter.   Valinda Hoar, NP 10/28/23 1213    Valinda Hoar, [place] 10/28/23 1216

## 2023-11-07 ENCOUNTER — Ambulatory Visit
Admission: EM | Admit: 2023-11-07 | Discharge: 2023-11-07 | Disposition: A | Discharge status: Home / Self Care | Payer: 59 | Attending: Emergency Medicine | Admitting: Emergency Medicine

## 2023-11-07 ENCOUNTER — Encounter: Payer: Self-pay | Admitting: Emergency Medicine

## 2023-11-07 DIAGNOSIS — J069 Acute upper respiratory infection, unspecified: Secondary | ICD-10-CM | POA: Diagnosis not present

## 2023-11-07 LAB — POCT RAPID STREP A (OFFICE): Rapid Strep A Screen: NEGATIVE

## 2023-11-07 MED ORDER — BENZONATATE 100 MG PO CAPS: 100.0000 mg | ORAL_CAPSULE | Freq: Three times a day (TID) | ORAL | 0 refills | Status: DC

## 2023-11-07 MED ORDER — PROMETHAZINE-DM 6.25-15 MG/5ML PO SYRP: 5.0000 mL | ORAL_SOLUTION | Freq: Every evening | ORAL | 0 refills | Status: DC | PRN

## 2023-11-07 MED ORDER — PREDNISONE 10 MG (21) PO TBPK: ORAL_TABLET | Freq: Every day | ORAL | 0 refills | Status: DC

## 2023-11-07 NOTE — Discharge Instructions (Signed)
 Your symptoms today are most likely being caused by a virus and should steadily improve in time it can take up to 7 to 10 days before you truly start to see a turnaround however things will get better  Rapid strep negative  Begin prednisone  every morning as directed to open and relax the airway, should help with shortness of breath and wheezing  May use Tessalon  pill every 8 hours as needed, may use cough syrup at bedtime for comfort    You can take Tylenol  and/or Ibuprofen as needed for fever reduction and pain relief.   For cough: honey 1/2 to 1 teaspoon (you can dilute the honey in water  or another fluid).  You can also use guaifenesin and dextromethorphan for cough. You can use a humidifier for chest congestion and cough.  If you don't have a humidifier, you can sit in the bathroom with the hot shower running.      For sore throat: try warm salt water  gargles, cepacol lozenges, throat spray, warm tea or water  with lemon/honey, popsicles or ice, or OTC cold relief medicine for throat discomfort.   For congestion: take a daily anti-histamine like Zyrtec, Claritin, and a oral decongestant, such as pseudoephedrine.  You can also use Flonase 1-2 sprays in each nostril daily.   It is important to stay hydrated: drink plenty of fluids (water , gatorade/powerade/pedialyte, juices, or teas) to keep your throat moisturized and help further relieve irritation/discomfort.

## 2023-11-07 NOTE — ED Provider Notes (Signed)
 Tammy Acevedo    CSN: [id number] Arrival date & time: 11/07/23  9044      History   Chief Complaint   HPI Tammy Acevedo is a 53 y.o. female.   Patient presents for evaluation of a productive cough, centralized chest tightness, centralized back pain, intermittent headaches and wheezing present when lying beginning 2 days ago.  Endorses returning to work 3 days ago after recent COVID-19 infection, all symptoms have resolved.  Has attempted use of Tylenol  on day and night.  Denies fever, shortness of breath  Past Medical History:  Diagnosis Date   AVNRT (AV nodal re-entry tachycardia) (HCC)    a. 10/2020 s/p EPS and RFCA.   Chest pain    a. 08/2015 ETT: Ex time 9:01, Max HR 166, No ECG changes->Nl study.   COVID-19 08/31/2019   Family history of breast cancer    Family history of lung cancer    Family history of throat cancer    Hypertension    Mild Mitral regurgitation    a. 08/2015 Echo: EF 50%, no rwma, Triv TR/PR, mild MR; b. 10/2020 Echo: EF 60-65%, no rwma, nl RV size/fxn, no MR/MS.   SVT (supraventricular tachycardia) (HCC) 04/04/2017   Started On beta blocker; followed by Dr. Monette S/p Ablation 10/2020 - off of all medications   Wears dentures    partial upper    Patient Active Problem List   Diagnosis Date Noted   Essential hypertension 10/03/2022   Genetic testing 04/06/2020   Family history of throat cancer    Atypical ductal hyperplasia of left breast 08/18/2019   Gastroesophageal reflux disease without esophagitis 12/17/2017   Anxiety 12/17/2017   PSVT (paroxysmal supraventricular tachycardia) (HCC) 04/04/2017   Bilateral carpal tunnel syndrome 03/20/2012    Past Surgical History:  Procedure Laterality Date   BREAST BIOPSY Left 08/11/2019   distortion, coil marker, radial scar and ADH   BREAST BIOPSY Left 10/07/2019   Procedure: BREAST BIOPSY WITH NEEDLE LOCALIZATION;  Surgeon: Tammy Shope, MD;  Location: [facility];  Service: General;   Laterality: Left;   BREAST LUMPECTOMY Left 10/07/2019   radial scar, ADH, dense stromal fibrosis excisied, clear surgical margins   CARPAL TUNNEL RELEASE     CATARACT EXTRACTION W/PHACO Right 07/13/2020   Procedure: CATARACT EXTRACTION PHACO AND INTRAOCULAR LENS PLACEMENT (IOC) RIGHT;  Surgeon: Tammy Gaskin, MD;  Location: [facility];  Service: Ophthalmology;  Laterality: Right;  7.53 1:21.3 9.3%   CATARACT EXTRACTION W/PHACO Left 08/03/2020   Procedure: CATARACT EXTRACTION PHACO AND INTRAOCULAR LENS PLACEMENT (IOC) LEFT 4.52 00:44.3 10.3%;  Surgeon: Tammy Gaskin, MD;  Location: [facility];  Service: Ophthalmology;  Laterality: Left;   CHOLECYSTECTOMY     COLONOSCOPY WITH PROPOFOL  N/A 12/07/2019   Procedure: COLONOSCOPY WITH PROPOFOL ;  Surgeon: Tammy Carmine, MD;  Location: [facility];  Service: Endoscopy;  Laterality: N/A;   SVT ABLATION N/A 10/24/2020   Procedure: SVT ABLATION;  Surgeon: Tammy Ole DASEN, MD;  Location: [facility];  Service: Cardiovascular;  Laterality: N/A;   TUBAL LIGATION      OB History   No obstetric history on file.      Home Medications    Prior to Admission medications   Medication Sig Start Date End Date Taking? Authorizing Provider  benzonatate  (TESSALON ) 100 MG capsule Take 1 capsule (100 mg total) by mouth every 8 (eight) hours. 11/07/23  Yes Chealsea Paske R, NP  predniSONE  (STERAPRED UNI-PAK 21 TAB) 10 MG (21) TBPK  tablet Take by mouth daily. Take 6 tabs by mouth daily  for 1 days, then 5 tabs for 1 days, then 4 tabs for 1 days, then 3 tabs for 1 days, 2 tabs for 1 days, then 1 tab by mouth daily for 1 days 11/07/23  Yes Yassir Enis R, NP  promethazine -dextromethorphan (PROMETHAZINE -DM) 6.25-15 MG/5ML syrup Take 5 mLs by mouth at bedtime as needed. 11/07/23  Yes Shamara Soza R, NP  acetaminophen  (TYLENOL ) 500 MG tablet Take 1,000 mg by mouth every 6 (six) hours as needed for moderate pain.     [provider]  levocetirizine (XYZAL) 5 MG tablet Take 5 mg by mouth every evening.    [provider]  metoprolol  tartrate (LOPRESSOR ) 25 MG tablet Take 0.5 tablets (12.5 mg total) by mouth 2 (two) times daily. 01/02/23 12/28/23  Acevedo, Tammy J, MD  Multiple Vitamins-Minerals (MULTIVITAMIN WITH MINERALS) tablet Take 1 tablet by mouth daily.    [provider]  tamoxifen  (NOLVADEX ) 20 MG tablet Take 1 tablet by mouth once daily 10/10/23   Tammy Annah BROCKS, MD    Family History Family History  Problem Relation Age of Onset   Diabetes Mother    COPD Father    Lung cancer Father 24   Breast cancer Maternal Aunt 35       dx again at 79~metastatic   Cancer Paternal Aunt        jaw cancer dx 63s   Breast cancer Cousin        dx 50s-60s. MAT that had breast cancer's daughter   Throat cancer Cousin     Social History Social History   Tobacco Use   Smoking status: Never   Smokeless tobacco: Never  Vaping Use   Vaping status: Never Used  Substance Use Topics   Alcohol use: No    Alcohol/week: 0.0 standard drinks of alcohol   Drug use: No     Allergies   Other   Review of Systems Review of Systems   Physical Exam Triage Vital Signs ED Triage Vitals  Encounter Vitals Group     BP 11/07/23 1056 134/69     Systolic BP Percentile --      Diastolic BP Percentile --      Pulse Rate 11/07/23 1056 93     Resp 11/07/23 1056 18     Temp 11/07/23 1057 98.3 F (36.8 C)     Temp Source 11/07/23 1057 Oral     SpO2 11/07/23 1056 100 %     Weight --      Height --      Head Circumference --      Peak Flow --      Pain Score 11/07/23 1056 5     Pain Loc --      Pain Education --      Exclude from Growth Chart --    No data found.  Updated Vital Signs BP 134/69 (BP Location: Left Arm)   Pulse 93   Temp 98.3 F (36.8 C) (Oral)   Resp 18   SpO2 100%   Visual Acuity Right Eye Distance:   Left Eye Distance:   Bilateral Distance:    Right Eye Near:    Left Eye Near:    Bilateral Near:     Physical Exam Constitutional:      Appearance: Normal appearance.  HENT:     Head: Normocephalic.     Right Ear: Tympanic membrane, ear canal and external ear  normal.     Left Ear: Tympanic membrane, ear canal and external ear normal.     Nose: Congestion present. No rhinorrhea.     Mouth/Throat:     Mouth: Mucous membranes are moist.     Pharynx: Oropharynx is clear. No oropharyngeal exudate or posterior oropharyngeal erythema.  Eyes:     Extraocular Movements: Extraocular movements intact.  Cardiovascular:     Rate and Rhythm: Normal rate and regular rhythm.     Pulses: Normal pulses.     Heart sounds: Normal heart sounds.  Pulmonary:     Effort: Pulmonary effort is normal.     Breath sounds: Normal breath sounds.  Musculoskeletal:     Cervical back: Normal range of motion and neck supple.  Neurological:     Mental Status: She is alert and oriented to person, place, and time. Mental status is at baseline.      UC Treatments / Results  Labs (all labs ordered are listed, but only abnormal results are displayed) Labs Reviewed  POCT RAPID STREP A (OFFICE) - Normal    EKG   Radiology No results found.  Procedures Procedures (including critical care time)  Medications Ordered in UC Medications - No data to display  Initial Impression / Assessment and Plan / UC Course  I have reviewed the triage vital signs and the nursing notes.  Pertinent labs & imaging results that were available during my care of the patient were reviewed by me and considered in my medical decision making (see chart for details).  Viral URI with cough  Patient is in no signs of distress nor toxic appearing.  Vital signs are stable.  Low suspicion for pneumonia, pneumothorax or bronchitis and therefore will defer imaging.  Strep testing negative, deferring COVID testing to illness.  Prescribed prednisone , Tessalon  and Promethazine  DM as etiology is most  likely viral. May use additional over-the-counter medications as needed for supportive care.  May follow-up with urgent care as needed if symptoms persist or worsen.  Note given.   Final Clinical Impressions(s) / UC Diagnoses   Final diagnoses:  Viral URI with cough     Discharge Instructions      Your symptoms today are most likely being caused by a virus and should steadily improve in time it can take up to 7 to 10 days before you truly start to see a turnaround however things will get better  Rapid strep negative  Begin prednisone  every morning as directed to open and relax the airway, should help with shortness of breath and wheezing  May use Tessalon  pill every 8 hours as needed, may use cough syrup at bedtime for comfort    You can take Tylenol  and/or Ibuprofen as needed for fever reduction and pain relief.   For cough: honey 1/2 to 1 teaspoon (you can dilute the honey in water  or another fluid).  You can also use guaifenesin and dextromethorphan for cough. You can use a humidifier for chest congestion and cough.  If you don't have a humidifier, you can sit in the bathroom with the hot shower running.      For sore throat: try warm salt water  gargles, cepacol lozenges, throat spray, warm tea or water  with lemon/honey, popsicles or ice, or OTC cold relief medicine for throat discomfort.   For congestion: take a daily anti-histamine like Zyrtec, Claritin, and a oral decongestant, such as pseudoephedrine.  You can also use Flonase 1-2 sprays in each nostril daily.   It is important  to stay hydrated: drink plenty of fluids (water , gatorade/powerade/pedialyte, juices, or teas) to keep your throat moisturized and help further relieve irritation/discomfort.    ED Prescriptions     Medication Sig Dispense Auth. Provider   predniSONE  (STERAPRED UNI-PAK 21 TAB) 10 MG (21) TBPK tablet Take by mouth daily. Take 6 tabs by mouth daily  for 1 days, then 5 tabs for 1 days, then 4 tabs for 1  days, then 3 tabs for 1 days, 2 tabs for 1 days, then 1 tab by mouth daily for 1 days 21 tablet Demaris Leavell R, NP   benzonatate  (TESSALON ) 100 MG capsule Take 1 capsule (100 mg total) by mouth every 8 (eight) hours. 21 capsule Teresa Price R, NP   promethazine -dextromethorphan (PROMETHAZINE -DM) 6.25-15 MG/5ML syrup Take 5 mLs by mouth at bedtime as needed. 118 mL Keyondra Lagrand, Price SAUNDERS, NP      PDMP not reviewed this encounter.   Teresa Price [place], [place] 11/07/23 1207

## 2023-11-07 NOTE — ED Triage Notes (Signed)
 Pt presents with a cough , chest tightness and sore throat since yesterday. Pt was diagnosed with covid on 10/28/23.

## 2023-11-11 ENCOUNTER — Ambulatory Visit
Admission: EM | Admit: 2023-11-11 | Discharge: 2023-11-11 | Disposition: A | Discharge status: Home / Self Care | Payer: 59 | Attending: Emergency Medicine | Admitting: Emergency Medicine

## 2023-11-11 DIAGNOSIS — J069 Acute upper respiratory infection, unspecified: Secondary | ICD-10-CM | POA: Diagnosis not present

## 2023-11-11 DIAGNOSIS — Z09 Encounter for follow-up examination after completed treatment for conditions other than malignant neoplasm: Secondary | ICD-10-CM

## 2023-11-11 MED ORDER — AMOXICILLIN-POT CLAVULANATE 875-125 MG PO TABS: 1.0000 | ORAL_TABLET | Freq: Two times a day (BID) | ORAL | 0 refills | Status: AC

## 2023-11-11 NOTE — ED Triage Notes (Addendum)
 Hoarse, Yellow mucus, Dry mouth, Dry cough x 3 weeks when she does cough up mucus its green

## 2023-11-11 NOTE — ED Provider Notes (Addendum)
 Arlander Bellman    CSN: [id number] Arrival date & time: 11/11/23  2841      History   Chief Complaint Chief Complaint  Patient presents with   Cough    HPI Tammy Acevedo is a 53 y.o. female.   Patient presents for evaluation of worsening nasal congestion productive cough with green sputum present for 3 weeks.  Has been evaluated in urgent care twice, initial diagnosis of COVID-19.  Denies shortness of breath or wheezing.  Poor appetite but tolerating food and liquids.  additionally has been taking Robitussin  Past Medical History:  Diagnosis Date   AVNRT (AV nodal re-entry tachycardia) (HCC)    a. 10/2020 s/p EPS and RFCA.   Chest pain    a. 08/2015 ETT: Ex time 9:01, Max HR 166, No ECG changes->Nl study.   COVID-19 08/31/2019   Family history of breast cancer    Family history of lung cancer    Family history of throat cancer    Hypertension    Mild Mitral regurgitation    a. 08/2015 Echo: EF 50%, no rwma, Triv TR/PR, mild MR; b. 10/2020 Echo: EF 60-65%, no rwma, nl RV size/fxn, no MR/MS.   SVT (supraventricular tachycardia) (HCC) 04/04/2017   Started On beta blocker; followed by Dr. Lina Render S/p Ablation 10/2020 - off of all medications   Wears dentures    partial upper    Patient Active Problem List   Diagnosis Date Noted   Essential hypertension 10/03/2022   Genetic testing 04/06/2020   Family history of throat cancer    Atypical ductal hyperplasia of left breast 08/18/2019   Gastroesophageal reflux disease without esophagitis 12/17/2017   Anxiety 12/17/2017   PSVT (paroxysmal supraventricular tachycardia) (HCC) 04/04/2017   Bilateral carpal tunnel syndrome 03/20/2012    Past Surgical History:  Procedure Laterality Date   BREAST BIOPSY Left 08/11/2019   distortion, coil marker, radial scar and ADH   BREAST BIOPSY Left 10/07/2019   Procedure: BREAST BIOPSY WITH NEEDLE LOCALIZATION;  Surgeon: Flynn Hylan, MD;  Location: [facility] ORS;  Service: General;   Laterality: Left;   BREAST LUMPECTOMY Left 10/07/2019   radial scar, ADH, dense stromal fibrosis excisied, clear surgical margins   CARPAL TUNNEL RELEASE     CATARACT EXTRACTION W/PHACO Right 07/13/2020   Procedure: CATARACT EXTRACTION PHACO AND INTRAOCULAR LENS PLACEMENT (IOC) RIGHT;  Surgeon: Annell Kidney, MD;  Location: [facility];  Service: Ophthalmology;  Laterality: Right;  7.53 1:21.3 9.3%   CATARACT EXTRACTION W/PHACO Left 08/03/2020   Procedure: CATARACT EXTRACTION PHACO AND INTRAOCULAR LENS PLACEMENT (IOC) LEFT 4.52 00:44.3 10.3%;  Surgeon: Annell Kidney, MD;  Location: [facility];  Service: Ophthalmology;  Laterality: Left;   CHOLECYSTECTOMY     COLONOSCOPY WITH PROPOFOL  N/A 12/07/2019   Procedure: COLONOSCOPY WITH PROPOFOL ;  Surgeon: Marnee Sink, MD;  Location: [facility];  Service: Endoscopy;  Laterality: N/A;   SVT ABLATION N/A 10/24/2020   Procedure: SVT ABLATION;  Surgeon: Boyce Byes, MD;  Location: [facility] INVASIVE CV LAB;  Service: Cardiovascular;  Laterality: N/A;   TUBAL LIGATION      OB History   No obstetric history on file.      Home Medications    Prior to Admission medications   Medication Sig Start Date End Date Taking? Authorizing Provider  amoxicillin -clavulanate (AUGMENTIN ) 875-125 MG tablet Take 1 tablet by mouth every 12 (twelve) hours for 10 days. 11/11/23 11/21/23 Yes Mikael Debell R, NP  benzonatate  (TESSALON ) 100 MG capsule Take  1 capsule (100 mg total) by mouth every 8 (eight) hours. 11/07/23  Yes Onia Shiflett, Maybelle Spatz, NP  metoprolol  tartrate (LOPRESSOR ) 25 MG tablet Take 0.5 tablets (12.5 mg total) by mouth 2 (two) times daily. 01/02/23 12/28/23 Yes Gollan, Timothy J, MD  Multiple Vitamins-Minerals (MULTIVITAMIN WITH MINERALS) tablet Take 1 tablet by mouth daily.   Yes [provider]  predniSONE  (STERAPRED UNI-PAK 21 TAB) 10 MG (21) TBPK tablet Take by mouth daily. Take 6 tabs by mouth daily  for 1 days,  then 5 tabs for 1 days, then 4 tabs for 1 days, then 3 tabs for 1 days, 2 tabs for 1 days, then 1 tab by mouth daily for 1 days 11/07/23  Yes Nyasia Baxley R, NP  promethazine -dextromethorphan (PROMETHAZINE -DM) 6.25-15 MG/5ML syrup Take 5 mLs by mouth at bedtime as needed. 11/07/23  Yes Reena Canning, NP  tamoxifen  (NOLVADEX ) 20 MG tablet Take 1 tablet by mouth once daily 10/10/23  Yes Rao, Archana C, MD  acetaminophen  (TYLENOL ) 500 MG tablet Take 1,000 mg by mouth every 6 (six) hours as needed for moderate pain.     [provider]  levocetirizine (XYZAL) 5 MG tablet Take 5 mg by mouth every evening.    [provider]    Family History Family History  Problem Relation Age of Onset   Diabetes Mother    COPD Father    Lung cancer Father 94   Breast cancer Maternal Aunt 57       dx again at 79~metastatic   Cancer Paternal Aunt        jaw cancer dx 22s   Breast cancer Cousin        dx 50s-60s. MAT that had breast cancer's daughter   Throat cancer Cousin     Social History Social History   Tobacco Use   Smoking status: Never   Smokeless tobacco: Never  Vaping Use   Vaping status: Never Used  Substance Use Topics   Alcohol use: No    Alcohol/week: 0.0 standard drinks of alcohol   Drug use: No     Allergies   Other   Review of Systems Review of Systems   Physical Exam Triage Vital Signs ED Triage Vitals  Encounter Vitals Group     BP 11/11/23 1020 (!) 123/93     Systolic BP Percentile --      Diastolic BP Percentile --      Pulse Rate 11/11/23 1020 94     Resp 11/11/23 1020 17     Temp 11/11/23 1020 98.1 F (36.7 C)     Temp Source 11/11/23 1020 Oral     SpO2 11/11/23 1020 98 %     Weight --      Height --      Head Circumference --      Peak Flow --      Pain Score 11/11/23 1022 3     Pain Loc --      Pain Education --      Exclude from Growth Chart --    No data found.  Updated Vital Signs BP (!) 123/93 (BP Location: Left Arm)    Pulse 94   Temp 98.1 F (36.7 C) (Oral)   Resp 17   SpO2 98%   Visual Acuity Right Eye Distance:   Left Eye Distance:   Bilateral Distance:    Right Eye Near:   Left Eye Near:    Bilateral Near:     Physical Exam  Constitutional:      Appearance: She is ill-appearing.  HENT:     Head: Normocephalic.     Right Ear: Tympanic membrane, ear canal and external ear normal.     Left Ear: Tympanic membrane, ear canal and external ear normal.     Nose: Congestion present. No rhinorrhea.     Mouth/Throat:     Mouth: Mucous membranes are moist.     Pharynx: Oropharynx is clear.  Eyes:     Extraocular Movements: Extraocular movements intact.  Cardiovascular:     Rate and Rhythm: Normal rate and regular rhythm.     Pulses: Normal pulses.     Heart sounds: Normal heart sounds.  Pulmonary:     Effort: Pulmonary effort is normal.     Breath sounds: Normal breath sounds.  Neurological:     Mental Status: She is alert and oriented to person, place, and time. Mental status is at baseline.      UC Treatments / Results  Labs (all labs ordered are listed, but only abnormal results are displayed) Labs Reviewed - No data to display  EKG   Radiology No results found.  Procedures Procedures (including critical care time)  Medications Ordered in UC Medications - No data to display  Initial Impression / Assessment and Plan / UC Course  I have reviewed the triage vital signs and the nursing notes.  Pertinent labs & imaging results that were available during my care of the patient were reviewed by me and considered in my medical decision making (see chart for details).  Acute URI, follow up exam  Patient is in no signs of distress nor toxic appearing.  Vital signs are stable.  Low suspicion for pneumonia, pneumothorax or bronchitis and therefore will defer imaging. Prescribed Augmentin .May use additional over-the-counter medications as needed for supportive care.  May follow-up with  urgent care as needed if symptoms persist or worsen. Note given   Final Clinical Impressions(s) / UC Diagnoses   Final diagnoses:  Acute URI     Discharge Instructions      Begin Augmentin  every morning and every evening for 10 days to provide bacterial coverage  You can take Tylenol  and/or Ibuprofen as needed for fever reduction and pain relief.   For cough: honey 1/2 to 1 teaspoon (you can dilute the honey in water  or another fluid).  You can also use guaifenesin and dextromethorphan for cough. You can use a humidifier for chest congestion and cough.  If you don't have a humidifier, you can sit in the bathroom with the hot shower running.      For sore throat: try warm salt water  gargles, cepacol lozenges, throat spray, warm tea or water  with lemon/honey, popsicles or ice, or OTC cold relief medicine for throat discomfort.   For congestion: take a daily anti-histamine like Zyrtec, Claritin, and a oral decongestant, such as pseudoephedrine.  You can also use Flonase 1-2 sprays in each nostril daily.   It is important to stay hydrated: drink plenty of fluids (water , gatorade/powerade/pedialyte, juices, or teas) to keep your throat moisturized and help further relieve irritation/discomfort.    ED Prescriptions     Medication Sig Dispense Auth. Provider   amoxicillin -clavulanate (AUGMENTIN ) 875-125 MG tablet Take 1 tablet by mouth every 12 (twelve) hours for 10 days. 20 tablet Mekia Dipinto R, NP      PDMP not reviewed this encounter.   Reena Canning, NP 11/11/23 1130    Reena Canning, NP 11/11/23 1131

## 2023-11-11 NOTE — Discharge Instructions (Signed)
 Begin Augmentin  every morning and every evening for 10 days to provide bacterial coverage  You can take Tylenol  and/or Ibuprofen as needed for fever reduction and pain relief.   For cough: honey 1/2 to 1 teaspoon (you can dilute the honey in water  or another fluid).  You can also use guaifenesin and dextromethorphan for cough. You can use a humidifier for chest congestion and cough.  If you don't have a humidifier, you can sit in the bathroom with the hot shower running.      For sore throat: try warm salt water  gargles, cepacol lozenges, throat spray, warm tea or water  with lemon/honey, popsicles or ice, or OTC cold relief medicine for throat discomfort.   For congestion: take a daily anti-histamine like Zyrtec, Claritin, and a oral decongestant, such as pseudoephedrine.  You can also use Flonase 1-2 sprays in each nostril daily.   It is important to stay hydrated: drink plenty of fluids (water , gatorade/powerade/pedialyte, juices, or teas) to keep your throat moisturized and help further relieve irritation/discomfort.

## 2023-11-14 ENCOUNTER — Ambulatory Visit: Payer: 59 | Admitting: Nurse Practitioner

## 2023-12-25 ENCOUNTER — Ambulatory Visit: Payer: 59 | Attending: Nurse Practitioner | Admitting: Nurse Practitioner

## 2023-12-25 ENCOUNTER — Encounter: Payer: Self-pay | Admitting: Nurse Practitioner

## 2023-12-25 VITALS — BP 110/58 | HR 60 | Ht 66.0 in | Wt 144.0 lb

## 2023-12-25 DIAGNOSIS — R002 Palpitations: Secondary | ICD-10-CM

## 2023-12-25 DIAGNOSIS — I10 Essential (primary) hypertension: Secondary | ICD-10-CM | POA: Diagnosis not present

## 2023-12-25 DIAGNOSIS — I471 Supraventricular tachycardia, unspecified: Secondary | ICD-10-CM

## 2023-12-25 NOTE — Patient Instructions (Signed)
 Medication Instructions:   Your physician recommends that you continue on your current medications as directed. Please refer to the Current Medication list given to you today.  *If you need a refill on your cardiac medications before your next appointment, please call your pharmacy*   Lab Work:  No labs ordered today   If you have labs (blood work) drawn today and your tests are completely normal, you will receive your results only by: MyChart Message (if you have MyChart) OR A paper copy in the mail If you have any lab test that is abnormal or we need to change your treatment, we will call you to review the results.   Testing/Procedures:  No test ordered today    Follow-Up: At [facility], you and your health needs are our priority.  As part of our continuing mission to provide you with exceptional heart care, we have created designated Provider Care Teams.  These Care Teams include your primary Cardiologist (physician) and Advanced Practice Providers (APPs -  Physician Assistants and Nurse Practitioners) who all work together to provide you with the care you need, when you need it.  We recommend signing up for the patient portal called "MyChart".  Sign up information is provided on this After Visit Summary.  MyChart is used to connect with patients for Virtual Visits (Telemedicine).  Patients are able to view lab/test results, encounter notes, upcoming appointments, etc.  Non-urgent messages can be sent to your provider as well.   To learn more about what you can do with MyChart, go to [email or web address].    Your next appointment:    1 year(s)  Provider:    You may see Julien Nordmann, MD or one of the following Advanced Practice Providers on your designated Care Team:   Nicolasa Ducking, NP Eula Listen, PA-C Cadence Fransico Michael, PA-C Charlsie Quest, NP Carlos Levering, NP

## 2023-12-25 NOTE — Progress Notes (Signed)
 Office Visit    Patient Name: Tammy Acevedo Date of Encounter: 12/25/2023  Primary Care Provider:  Reubin Milan, MD Primary Cardiologist:  Julien Nordmann, MD  Chief Complaint    53 y.o. female with a history of PSVT/AVNRT status post catheter ablation 2022, chest pain, hypertension, and mild mitral regurgitation, who presents for follow-up related to palpitations and presyncope.  Past Medical History  Subjective   Past Medical History:  Diagnosis Date   AVNRT (AV nodal re-entry tachycardia) (HCC)    a. 10/2020 s/p EPS and RFCA; b. 12/2021 Zio: Predominantly sinus rhythm at 80 (44-197).  2 runs of nonsustained VT, longest 8 beats at 147, fastest 4 beats at 197.  27 SVT runs, longest 11 beats at 125, fastest 6 beats at 185.  4.3% PAC burden.  Rare PVCs.   Chest pain    a. 08/2015 ETT: Ex time 9:01, Max HR 166, No ECG changes->Nl study.   COVID-19 08/31/2019   Family history of breast cancer    Family history of lung cancer    Family history of throat cancer    Hypertension    Mild Mitral regurgitation    a. 08/2015 Echo: EF 50%, no rwma, Triv TR/PR, mild MR; b. 10/2020 Echo: EF 60-65%, no rwma, nl RV size/fxn, no MR/MS; c. 03/2022 Echo: EF 55-60%, no rwma, nl RV fxn, no MR.   NSVT (nonsustained ventricular tachycardia) (HCC)    a. 12/2021 Zio: 2 NSVT runs, longest 8 beats @ 147.   Premature atrial contractions    a. 12/2021 Zio: 4.3% PAC burden.   Wears dentures    partial upper   Past Surgical History:  Procedure Laterality Date   BREAST BIOPSY Left 08/11/2019   distortion, coil marker, radial scar and ADH   BREAST BIOPSY Left 10/07/2019   Procedure: BREAST BIOPSY WITH NEEDLE LOCALIZATION;  Surgeon: Campbell Lerner, MD;  Location: [facility] ORS;  Service: General;  Laterality: Left;   BREAST LUMPECTOMY Left 10/07/2019   radial scar, ADH, dense stromal fibrosis excisied, clear surgical margins   CARPAL TUNNEL RELEASE     CATARACT EXTRACTION W/PHACO Right 07/13/2020    Procedure: CATARACT EXTRACTION PHACO AND INTRAOCULAR LENS PLACEMENT (IOC) RIGHT;  Surgeon: Lockie Mola, MD;  Location: [facility];  Service: Ophthalmology;  Laterality: Right;  7.53 1:21.3 9.3%   CATARACT EXTRACTION W/PHACO Left 08/03/2020   Procedure: CATARACT EXTRACTION PHACO AND INTRAOCULAR LENS PLACEMENT (IOC) LEFT 4.52 00:44.3 10.3%;  Surgeon: Lockie Mola, MD;  Location: [facility];  Service: Ophthalmology;  Laterality: Left;   CHOLECYSTECTOMY     COLONOSCOPY WITH PROPOFOL N/A 12/07/2019   Procedure: COLONOSCOPY WITH PROPOFOL;  Surgeon: Midge Minium, MD;  Location: [facility];  Service: Endoscopy;  Laterality: N/A;   SVT ABLATION N/A 10/24/2020   Procedure: SVT ABLATION;  Surgeon: Lanier Prude, MD;  Location: [facility] INVASIVE CV LAB;  Service: Cardiovascular;  Laterality: N/A;   TUBAL LIGATION      Allergies  Allergies  Allergen Reactions   Other Swelling    ANTS      History of Present Illness      53 y.o. y/o female with above past medical history including SVT/AVNRT, chest pain, hypertension, and mild mitral regurgitation. She was previously evaluated at [facility] clinic in 2016, in the setting of chest pain, with stress testing showing good exercise tolerance and no acute ST or T changes. Echocardiogram at that time showed an EF of 50% without regional wall motion abnormalities. Mild  mitral regurgitation was noted. In January 2021, she underwent lumpectomy of the left breast in the setting of focal atypical ductal hyperplasia. She has since been followed by oncology and remains on tamoxifen therapy. Throughout the fall 2021, she noted increasing palpitations and documentation of SVT. She was referred to electrophysiology and subsequently underwent catheter ablation Tammy Acevedo) in January 2022. During the procedure, there was a nonclinical atrial tachycardia noted, which was not targeted for ablation.   In the spring 2023, she had recurrent  palpitations with presyncope and underwent event monitoring which showed predominantly sinus rhythm with 2 brief runs of nonsustained VT and 27 brief runs of SVT, the longest lasting 11 beats.  Metoprolol 12.5 mg twice daily was added and f/u echo was performed showing an EF of 55 to 60% without regional wall motion abnormalities or significant valvular disease.  Palpitations subsequently improved on low-dose metoprolol.    Tammy Acevedo was last seen in cardiology clinic in November 2023, at which time she was doing well from a cardiac standpoint. Over the past year, she has only had one episode of palpitations/flutters that lasted 10 minutes. She attributes this to switching roles at work and increasing her caffeine intake on one particular day.  No palpitations since then. She otherwise has remained active over the past year without chest pain or dyspnea. She denies PND, orthopnea, dizziness, syncope, edema, or early satiety.   Objective  Home Medications    Current Outpatient Medications  Medication Sig Dispense Refill   acetaminophen (TYLENOL) 500 MG tablet Take 1,000 mg by mouth every 6 (six) hours as needed for moderate pain.      benzonatate (TESSALON) 100 MG capsule Take 1 capsule (100 mg total) by mouth every 8 (eight) hours. 21 capsule 0   levocetirizine (XYZAL) 5 MG tablet Take 5 mg by mouth every evening.     metoprolol tartrate (LOPRESSOR) 25 MG tablet Take 0.5 tablets (12.5 mg total) by mouth 2 (two) times daily. 90 tablet 3   Multiple Vitamins-Minerals (MULTIVITAMIN WITH MINERALS) tablet Take 1 tablet by mouth daily.     promethazine-dextromethorphan (PROMETHAZINE-DM) 6.25-15 MG/5ML syrup Take 5 mLs by mouth at bedtime as needed. 118 mL 0   tamoxifen (NOLVADEX) 20 MG tablet Take 1 tablet by mouth once daily 90 tablet 0   No current facility-administered medications for this visit.     Physical Exam    VS:  BP (!) 110/58   Pulse 60   Ht 5\' 6"  (1.676 m)   Wt 144 lb (65.3 kg)    SpO2 97%   BMI 23.24 kg/m  , BMI Body mass index is 23.24 kg/m.       GEN: Well nourished, well developed, in no acute distress. HEENT: normal. Neck: Supple, no JVD, carotid bruits, or masses. Cardiac: RRR, soft 1/6 systolic murmur at the upper sternal borders, no rubs, or gallops. No clubbing, cyanosis, edema.  Radials 2+/PT 2+ and equal bilaterally.  Respiratory:  Respirations regular and unlabored, clear to auscultation bilaterally. GI: Soft, nontender, nondistended, BS + x 4. MS: no deformity or atrophy. Skin: warm and dry, no rash. Neuro:  Strength and sensation are intact. Psych: Normal affect.  Accessory Clinical Findings    ECG personally reviewed by me today -    Sinus Bradycardia, 57 - no acute changes.  Lab Results  Component Value Date   WBC 6.1 08/09/2022   HGB 12.4 08/09/2022   HCT 36.7 08/09/2022   MCV 84.6 08/09/2022   PLT 239 08/09/2022  Lab Results  Component Value Date   CREATININE 0.88 08/09/2022   BUN 9 08/09/2022   NA 142 08/09/2022   K 3.6 08/09/2022   CL 109 08/09/2022   CO2 25 08/09/2022   Lab Results  Component Value Date   ALT 22 08/09/2022   AST 27 08/09/2022   ALKPHOS 74 08/09/2022   BILITOT 0.7 08/09/2022   Lab Results  Component Value Date   CHOL 127 10/04/2022   HDL 44 10/04/2022   LDLCALC 67 10/04/2022   TRIG 78 10/04/2022   CHOLHDL 2.9 10/04/2022    Lab Results  Component Value Date   HGBA1C 5.3 10/04/2022   Lab Results  Component Value Date   TSH 0.692 10/04/2022       Assessment & Plan    1. Palpitations/presyncope: Patient with prior history of AVNRT status post catheter ablation in January 2022, who has done well over the past year but then on Monday, had an episode of fluttering type palpitations lasting about 10 minutes, resolving spontaneously. She says that she was anxious that day and drank 4 caffeinated drinks that day, resulting in palpitations.  No recurrence since.  ECG normal in office. She is otherwise  without complaints. Continue metoprolol and avoid caffeine/triggers.  2. Essential Hypertension: Well controled and soft on metoprolol therapy. BP in office 110/58.   3. Disposition:  Follow-up in clinic in 12 months or sooner if necessary.   Nicolasa Ducking, NP 12/25/2023, 12:48 PM

## 2023-12-27 ENCOUNTER — Ambulatory Visit (INDEPENDENT_AMBULATORY_CARE_PROVIDER_SITE_OTHER): Payer: 59 | Admitting: Internal Medicine

## 2023-12-27 ENCOUNTER — Encounter: Payer: Self-pay | Admitting: Internal Medicine

## 2023-12-27 VITALS — BP 116/72 | HR 54 | Ht 66.0 in | Wt 152.0 lb

## 2023-12-27 DIAGNOSIS — Z0000 Encounter for general adult medical examination without abnormal findings: Secondary | ICD-10-CM | POA: Diagnosis not present

## 2023-12-27 DIAGNOSIS — I10 Essential (primary) hypertension: Secondary | ICD-10-CM | POA: Diagnosis not present

## 2023-12-27 DIAGNOSIS — Z13220 Encounter for screening for lipoid disorders: Secondary | ICD-10-CM | POA: Diagnosis not present

## 2023-12-27 DIAGNOSIS — K219 Gastro-esophageal reflux disease without esophagitis: Secondary | ICD-10-CM

## 2023-12-27 DIAGNOSIS — N6092 Unspecified benign mammary dysplasia of left breast: Secondary | ICD-10-CM

## 2023-12-27 NOTE — Assessment & Plan Note (Signed)
 Blood pressure is well controlled.  Current medications are metoprolol. Will continue same regimen along with efforts to limit dietary sodium.

## 2023-12-27 NOTE — Progress Notes (Signed)
 Date:  12/27/2023   Name:  Tammy Acevedo   DOB:  [birth year]   MRN:  [id number]   Chief Complaint: Annual Exam Tammy Acevedo is a 53 y.o. female who presents today for her Complete Annual Exam. She feels fairly well. She reports exercising none. She reports she is sleeping fairly well. Breast complaints none.  She sees GYN for Pap and oncology for mammograms.  Health Maintenance  Topic Date Due   HIV Screening  Never done   DTaP/Tdap/Td vaccine (2 - Td or Tdap) 12/23/2022   Flu Shot  12/30/2023*   COVID-19 Vaccine (3 - 2024-25 season) 01/12/2024*   Mammogram  09/04/2024   Pap with HPV screening  06/08/2025   Colon Cancer Screening  12/06/2029   Hepatitis C Screening  Completed   Zoster (Shingles) Vaccine  Completed   HPV Vaccine  Aged Out  *Topic was postponed. The date shown is not the original due date.    Hypertension This is a chronic problem. The problem is controlled. Pertinent negatives include no chest pain, headaches, palpitations or shortness of breath. Past treatments include beta blockers.    Review of Systems  Constitutional:  Negative for fatigue and unexpected weight change.  HENT:  Negative for trouble swallowing.   Eyes:  Negative for visual disturbance.  Respiratory:  Negative for cough, chest tightness, shortness of breath and wheezing.   Cardiovascular:  Negative for chest pain, palpitations and leg swelling.  Gastrointestinal:  Negative for abdominal pain, constipation and diarrhea.  Musculoskeletal:  Negative for arthralgias and myalgias.  Neurological:  Negative for dizziness, weakness, light-headedness and headaches.     Lab Results  Component Value Date   NA 142 08/09/2022   K 3.6 08/09/2022   CO2 25 08/09/2022   GLUCOSE 108 (H) 08/09/2022   BUN 9 08/09/2022   CREATININE 0.88 08/09/2022   CALCIUM 8.8 (L) 08/09/2022   EGFR 89 10/03/2021   GFRNONAA >60 08/09/2022   Lab Results  Component Value Date   CHOL 127 10/04/2022    HDL 44 10/04/2022   LDLCALC 67 10/04/2022   TRIG 78 10/04/2022   CHOLHDL 2.9 10/04/2022   Lab Results  Component Value Date   TSH 0.692 10/04/2022   Lab Results  Component Value Date   HGBA1C 5.3 10/04/2022   Lab Results  Component Value Date   WBC 6.1 08/09/2022   HGB 12.4 08/09/2022   HCT 36.7 08/09/2022   MCV 84.6 08/09/2022   PLT 239 08/09/2022   Lab Results  Component Value Date   ALT 22 08/09/2022   AST 27 08/09/2022   ALKPHOS 74 08/09/2022   BILITOT 0.7 08/09/2022   No results found for: "25OHVITD2", "25OHVITD3", "VD25OH"   Patient Active Problem List   Diagnosis Date Noted   Essential hypertension 10/03/2022   Genetic testing 04/06/2020   Family history of throat cancer    Atypical ductal hyperplasia of left breast 08/18/2019   Gastroesophageal reflux disease without esophagitis 12/17/2017   Anxiety 12/17/2017   PSVT (paroxysmal supraventricular tachycardia) (HCC) 04/04/2017   Bilateral carpal tunnel syndrome 03/20/2012    Allergies  Allergen Reactions   Other Swelling    ANTS    Past Surgical History:  Procedure Laterality Date   BREAST BIOPSY Left 08/11/2019   distortion, coil marker, radial scar and ADH   BREAST BIOPSY Left 10/07/2019   Procedure: BREAST BIOPSY WITH NEEDLE LOCALIZATION;  Surgeon: Campbell Lerner, MD;  Location: [facility] ORS;  Service: General;  Laterality: Left;  BREAST LUMPECTOMY Left 10/07/2019   radial scar, ADH, dense stromal fibrosis excisied, clear surgical margins   CARPAL TUNNEL RELEASE     CATARACT EXTRACTION W/PHACO Right 07/13/2020   Procedure: CATARACT EXTRACTION PHACO AND INTRAOCULAR LENS PLACEMENT (IOC) RIGHT;  Surgeon: Lockie Mola, MD;  Location: [facility];  Service: Ophthalmology;  Laterality: Right;  7.53 1:21.3 9.3%   CATARACT EXTRACTION W/PHACO Left 08/03/2020   Procedure: CATARACT EXTRACTION PHACO AND INTRAOCULAR LENS PLACEMENT (IOC) LEFT 4.52 00:44.3 10.3%;  Surgeon: Lockie Mola, MD;   Location: [facility];  Service: Ophthalmology;  Laterality: Left;   CHOLECYSTECTOMY     COLONOSCOPY WITH PROPOFOL N/A 12/07/2019   Procedure: COLONOSCOPY WITH PROPOFOL;  Surgeon: Midge Minium, MD;  Location: [facility];  Service: Endoscopy;  Laterality: N/A;   SVT ABLATION N/A 10/24/2020   Procedure: SVT ABLATION;  Surgeon: Lanier Prude, MD;  Location: [facility] INVASIVE CV LAB;  Service: Cardiovascular;  Laterality: N/A;   TUBAL LIGATION      Social History   Tobacco Use   Smoking status: Never   Smokeless tobacco: Never  Vaping Use   Vaping status: Never Used  Substance Use Topics   Alcohol use: No    Alcohol/week: 0.0 standard drinks of alcohol   Drug use: No     Medication list has been reviewed and updated.  Current Meds  Medication Sig   acetaminophen (TYLENOL) 500 MG tablet Take 1,000 mg by mouth every 6 (six) hours as needed for moderate pain.    levocetirizine (XYZAL) 5 MG tablet Take 5 mg by mouth every evening.   metoprolol tartrate (LOPRESSOR) 25 MG tablet Take 0.5 tablets (12.5 mg total) by mouth 2 (two) times daily.   Multiple Vitamins-Minerals (MULTIVITAMIN WITH MINERALS) tablet Take 1 tablet by mouth daily.   tamoxifen (NOLVADEX) 20 MG tablet Take 1 tablet by mouth once daily       12/27/2023    9:46 AM 11/01/2022    4:22 PM 10/04/2022    9:27 AM 10/03/2021    9:56 AM  GAD 7 : Generalized Anxiety Score  Nervous, Anxious, on Edge 0 0 0 0  Control/stop worrying 0 0 0 0  Worry too much - different things 0 0 0 0  Trouble relaxing 0 0 0 0  Restless 0 0 0 0  Easily annoyed or irritable 0 0 0 0  Afraid - awful might happen 0 0 0 0  Total GAD 7 Score 0 0 0 0  Anxiety Difficulty Not difficult at all Not difficult at all Not difficult at all Not difficult at all       12/27/2023    9:46 AM 11/01/2022    4:22 PM 10/04/2022    9:27 AM  Depression screen PHQ 2/9  Decreased Interest 0 0 0  Down, Depressed, Hopeless 0 0 0  PHQ - 2 Score 0 0 0  Altered  sleeping 0 0 0  Tired, decreased energy 0 0 0  Change in appetite 0 0 0  Feeling bad or failure about yourself  0 0 0  Trouble concentrating 0 0 0  Moving slowly or fidgety/restless 0 0 0  Suicidal thoughts 0 0 0  PHQ-9 Score 0 0 0  Difficult doing work/chores Not difficult at all Not difficult at all Not difficult at all    BP Readings from Last 3 Encounters:  12/27/23 116/72  12/25/23 (!) 110/58  11/11/23 (!) 123/93    Physical Exam Vitals and nursing note reviewed.  Constitutional:      General: She is not in acute distress.    Appearance: She is well-developed.  HENT:     Head: Normocephalic and atraumatic.  Eyes:     General: No scleral icterus.       Right eye: No discharge.        Left eye: No discharge.     Conjunctiva/sclera: Conjunctivae normal.  Neck:     Thyroid: No thyromegaly.     Vascular: No carotid bruit.  Cardiovascular:     Rate and Rhythm: Normal rate and regular rhythm.     Pulses: Normal pulses.     Heart sounds: Normal heart sounds.  Pulmonary:     Effort: Pulmonary effort is normal. No respiratory distress.     Breath sounds: No wheezing.  Abdominal:     General: Bowel sounds are normal.     Palpations: Abdomen is soft.     Tenderness: There is no abdominal tenderness.  Musculoskeletal:     Cervical back: Normal range of motion. No erythema.     Right lower leg: No edema.     Left lower leg: No edema.  Lymphadenopathy:     Cervical: No cervical adenopathy.  Skin:    General: Skin is warm and dry.     Findings: No rash.  Neurological:     Mental Status: She is alert and oriented to person, place, and time.     Cranial Nerves: No cranial nerve deficit.     Sensory: No sensory deficit.     Deep Tendon Reflexes: Reflexes are normal and symmetric.  Psychiatric:        Attention and Perception: Attention normal.        Mood and Affect: Mood normal.     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  Current medications are metoprolol. Will continue same regimen along with efforts to limit dietary sodium.       Relevant Orders   CBC with Differential/Platelet   Comprehensive metabolic panel with GFR   TSH   Urinalysis, Routine w reflex microscopic   Other Visit Diagnoses       Annual physical exam    -  Primary   Relevant Orders   CBC with Differential/Platelet   Comprehensive metabolic panel with GFR   Lipid panel   TSH   Urinalysis, Routine w reflex microscopic     Screening for lipid disorders       Relevant Orders   Lipid panel       No follow-ups on file.    Reubin Milan, MD [facility] [place]

## 2023-12-28 LAB — COMPREHENSIVE METABOLIC PANEL WITH GFR
ALT: 12 IU/L (ref 0–32)
AST: 18 IU/L (ref 0–40)
Albumin: 4 g/dL (ref 3.8–4.9)
Alkaline Phosphatase: 80 IU/L (ref 44–121)
BUN/Creatinine Ratio: 10 (ref 9–23)
BUN: 8 mg/dL (ref 6–24)
Bilirubin Total: 0.3 mg/dL (ref 0.0–1.2)
CO2: 24 mmol/L (ref 20–29)
Calcium: 8.8 mg/dL (ref 8.7–10.2)
Chloride: 104 mmol/L (ref 96–106)
Creatinine, Ser: 0.83 mg/dL (ref 0.57–1.00)
Globulin, Total: 2.6 g/dL (ref 1.5–4.5)
Glucose: 80 mg/dL (ref 70–99)
Potassium: 4 mmol/L (ref 3.5–5.2)
Sodium: 143 mmol/L (ref 134–144)
Total Protein: 6.6 g/dL (ref 6.0–8.5)
eGFR: 84 mL/min/{1.73_m2} (ref >59)

## 2023-12-28 LAB — CBC WITH DIFFERENTIAL/PLATELET
Basophils Absolute: 0 10*3/uL (ref 0.0–0.2)
Basos: 0 %
EOS (ABSOLUTE): 0.1 10*3/uL (ref 0.0–0.4)
Eos: 2 %
Hematocrit: 37.4 % (ref 34.0–46.6)
Hemoglobin: 12.4 g/dL (ref 11.1–15.9)
Immature Grans (Abs): 0 10*3/uL (ref 0.0–0.1)
Immature Granulocytes: 0 %
Lymphocytes Absolute: 3 10*3/uL (ref 0.7–3.1)
Lymphs: 44 %
MCH: 29.1 pg (ref 26.6–33.0)
MCHC: 33.2 g/dL (ref 31.5–35.7)
MCV: 88 fL (ref 79–97)
Monocytes Absolute: 0.4 10*3/uL (ref 0.1–0.9)
Monocytes: 6 %
Neutrophils Absolute: 3.3 10*3/uL (ref 1.4–7.0)
Neutrophils: 48 %
Platelets: 233 10*3/uL (ref 150–450)
RBC: 4.26 x10E6/uL (ref 3.77–5.28)
RDW: 12.5 % (ref 11.7–15.4)
WBC: 6.9 10*3/uL (ref 3.4–10.8)

## 2023-12-28 LAB — URINALYSIS, ROUTINE W REFLEX MICROSCOPIC
Bilirubin, UA: NEGATIVE
Glucose, UA: NEGATIVE
Ketones, UA: NEGATIVE
Nitrite, UA: NEGATIVE
Protein,UA: NEGATIVE
RBC, UA: NEGATIVE
Specific Gravity, UA: 1.013 (ref 1.005–1.030)
Urobilinogen, Ur: 0.2 mg/dL (ref 0.2–1.0)
pH, UA: 5.5 (ref 5.0–7.5)

## 2023-12-28 LAB — LIPID PANEL
Chol/HDL Ratio: 3 ratio (ref 0.0–4.4)
Cholesterol, Total: 152 mg/dL (ref 100–199)
HDL: 51 mg/dL (ref >39)
LDL Chol Calc (NIH): 81 mg/dL (ref 0–99)
Triglycerides: 109 mg/dL (ref 0–149)
VLDL Cholesterol Cal: 20 mg/dL (ref 5–40)

## 2023-12-28 LAB — MICROSCOPIC EXAMINATION
Casts: NONE SEEN /LPF
Epithelial Cells (non renal): >10 /HPF — AB (ref 0–10)
RBC, Urine: NONE SEEN /HPF (ref 0–2)

## 2023-12-28 LAB — TSH: TSH: 2.03 u[IU]/mL (ref 0.450–4.500)

## 2023-12-29 ENCOUNTER — Encounter: Payer: Self-pay | Admitting: Internal Medicine

## 2024-01-30 ENCOUNTER — Other Ambulatory Visit: Payer: Self-pay | Admitting: Cardiovascular Disease

## 2024-02-03 ENCOUNTER — Inpatient Hospital Stay: Payer: 59 | Attending: Oncology | Admitting: Oncology

## 2024-02-03 ENCOUNTER — Encounter: Payer: Self-pay | Admitting: Oncology

## 2024-02-03 VITALS — BP 127/65 | HR 98 | Temp 98.6°F | Resp 20 | Wt 151.4 lb

## 2024-02-03 DIAGNOSIS — Z79810 Long term (current) use of selective estrogen receptor modulators (SERMs): Secondary | ICD-10-CM | POA: Diagnosis not present

## 2024-02-03 DIAGNOSIS — N6092 Unspecified benign mammary dysplasia of left breast: Secondary | ICD-10-CM | POA: Insufficient documentation

## 2024-02-03 DIAGNOSIS — Z79899 Other long term (current) drug therapy: Secondary | ICD-10-CM

## 2024-02-03 DIAGNOSIS — Z5181 Encounter for therapeutic drug level monitoring: Secondary | ICD-10-CM

## 2024-02-03 NOTE — Progress Notes (Signed)
 Hematology/Oncology Consult note [facility]  [phone] [phone]  Patient Care Team: Sheron Dixons, MD as PCP - General (Internal Medicine) Devorah Fonder, MD as PCP - Cardiology (Cardiology) Boyce Byes, MD as PCP - Electrophysiology (Cardiology) Margette Sheldon, MD as Referring Physician (Cardiology) Prescilla Brod, MD as Consulting Physician (Obstetrics and Gynecology) Avonne Boettcher, MD as Consulting Physician (Oncology)   Name of the patient: Tammy Acevedo  [id number]  07-16-1971   Date of visit: 02/03/24  Diagnosis- history of atypical ductal hyperplasia on tamoxifen    Chief complaint/ Reason for visit-routine follow-up of ADH on tamoxifen   Heme/Onc history: Patient is a 53 year old premenopausal female with no prior significant medical history for breast cancer or breast biopsies.  She recently underwent a screening mammogram in October 2020 with which showed possible architectural distortion in the left breast as well as a possible mass in the right breast.  Ultrasound of the right breast showed a benign cyst in the right breast.  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  Patient was seen by Dr. Ofilia Benton and she underwent a lumpectomy which showed a radial scar with florid ductal hyperplasia and sclerosing adenosis.  Normal residual atypical proliferative breast disease or evidence of DCIS or invasive malignancy.  Margins were negative   Risks and benefits of chemoprevention for atypical ductal hyperplasia but tamoxifen  was discussed.  Patient started taking tamoxifen  in August 2021.  She attained menopause at 48 and no menstrual cycle since then  Interval history-denies any hot flashes mood swings or arthralgias and tamoxifen .  Denies any breast concerns at this time  ECOG PS-  0 Pain scale- 0   Review of systems- Review of Systems  Constitutional:  Negative for chills, fever, malaise/fatigue and weight loss.  HENT:  Negative for congestion, ear discharge and nosebleeds.   Eyes:  Negative for blurred vision.  Respiratory:  Negative for cough, hemoptysis, sputum production, shortness of breath and wheezing.   Cardiovascular:  Negative for chest pain, palpitations, orthopnea and claudication.  Gastrointestinal:  Negative for abdominal pain, blood in stool, constipation, diarrhea, heartburn, melena, nausea and vomiting.  Genitourinary:  Negative for dysuria, flank pain, frequency, hematuria and urgency.  Musculoskeletal:  Negative for back pain, joint pain and myalgias.  Skin:  Negative for rash.  Neurological:  Negative for dizziness, tingling, focal weakness, seizures, weakness and headaches.  Endo/Heme/Allergies:  Does not bruise/bleed easily.  Psychiatric/Behavioral:  Negative for depression and suicidal ideas. The patient does not have insomnia.       Allergies  Allergen Reactions   Other Swelling    ANTS     Past Medical History:  Diagnosis Date   AVNRT (AV nodal re-entry tachycardia) (HCC)    a. 10/2020 s/p EPS and RFCA; b. 12/2021 Zio: Predominantly sinus rhythm at 80 (44-197).  2 runs of nonsustained VT, longest 8 beats at 147, fastest 4 beats at 197.  27 SVT runs, longest 11 beats at 125, fastest 6 beats at 185.  4.3% PAC burden.  Rare PVCs.   Chest pain    a. 08/2015 ETT: Ex time 9:01, Max HR 166, No ECG changes->Nl study.   COVID-19 08/31/2019   Family history of breast cancer    Family history of lung cancer    Family history of throat cancer    Hypertension    Mild Mitral  regurgitation    a. 08/2015 Echo: EF 50%, no rwma, Triv TR/PR, mild MR; b. 10/2020 Echo: EF 60-65%, no rwma, nl RV size/fxn, no MR/MS; c. 03/2022 Echo: EF 55-60%, no rwma, nl RV fxn, no MR.   NSVT (nonsustained ventricular tachycardia) (HCC)    a. 12/2021 Zio: 2 NSVT runs,  longest 8 beats @ 147.   Premature atrial contractions    a. 12/2021 Zio: 4.3% PAC burden.   Wears dentures    partial upper     Past Surgical History:  Procedure Laterality Date   BREAST BIOPSY Left 08/11/2019   distortion, coil marker, radial scar and ADH   BREAST BIOPSY Left 10/07/2019   Procedure: BREAST BIOPSY WITH NEEDLE LOCALIZATION;  Surgeon: Flynn Hylan, MD;  Location: [facility] ORS;  Service: General;  Laterality: Left;   BREAST LUMPECTOMY Left 10/07/2019   radial scar, ADH, dense stromal fibrosis excisied, clear surgical margins   CARPAL TUNNEL RELEASE     CATARACT EXTRACTION W/PHACO Right 07/13/2020   Procedure: CATARACT EXTRACTION PHACO AND INTRAOCULAR LENS PLACEMENT (IOC) RIGHT;  Surgeon: Annell Kidney, MD;  Location: [facility];  Service: Ophthalmology;  Laterality: Right;  7.53 1:21.3 9.3%   CATARACT EXTRACTION W/PHACO Left 08/03/2020   Procedure: CATARACT EXTRACTION PHACO AND INTRAOCULAR LENS PLACEMENT (IOC) LEFT 4.52 00:44.3 10.3%;  Surgeon: Annell Kidney, MD;  Location: [facility];  Service: Ophthalmology;  Laterality: Left;   CHOLECYSTECTOMY     COLONOSCOPY WITH PROPOFOL  N/A 12/07/2019   Procedure: COLONOSCOPY WITH PROPOFOL ;  Surgeon: Marnee Sink, MD;  Location: [facility];  Service: Endoscopy;  Laterality: N/A;   SVT ABLATION N/A 10/24/2020   Procedure: SVT ABLATION;  Surgeon: Boyce Byes, MD;  Location: [facility] INVASIVE CV LAB;  Service: Cardiovascular;  Laterality: N/A;   TUBAL LIGATION      Social History   Socioeconomic History   Marital status: Married    Spouse name: Not on file   Number of children: 2   Years of education: Not on file   Highest education level: Not on file  Occupational History   Not on file  Tobacco Use   Smoking status: Never   Smokeless tobacco: Never  Vaping Use   Vaping status: Never Used  Substance and Sexual Activity   Alcohol use: No    Alcohol/week: 0.0 standard drinks of alcohol    Drug use: No   Sexual activity: Not Currently  Other Topics Concern   Not on file  Social History Narrative   Not on file   Social Drivers of Health   Financial Resource Strain: Low Risk  (09/25/2017)   Overall Financial Resource Strain (CARDIA)    Difficulty of Paying Living Expenses: Not hard at all  Food Insecurity: No Food Insecurity (12/27/2023)   Hunger Vital Sign    Worried About Running Out of Food in the Last Year: Never true    Ran Out of Food in the Last Year: Never true  Transportation Needs: No Transportation Needs (06/26/2023)   Received from [facility]   PRAPARE - Transportation    In the past 12 months, has lack of transportation kept you from medical appointments or from getting medications?: No    Lack of Transportation (Non-Medical): No  Physical Activity: Inactive (09/25/2017)   Exercise Vital Sign    Days of Exercise per Week: 0 days    Minutes of Exercise per Session: 0 min  Stress: No Stress Concern Present (09/25/2017)   [employer] of Occupational  Health - Occupational Stress Questionnaire    Feeling of Stress : Not at all  Social Connections: Moderately Integrated (09/25/2017)   Social Connection and Isolation Panel [NHANES]    Frequency of Communication with Friends and Family: More than three times a week    Frequency of Social Gatherings with Friends and Family: Three times a week    Attends Religious Services: More than 4 times per year    Active Member of Clubs or Organizations: No    Attends Banker Meetings: Never    Marital Status: Married  Catering manager Violence: Not At Risk (12/27/2023)   Humiliation, Afraid, Rape, and Kick questionnaire    Fear of Current or Ex-Partner: No    Emotionally Abused: No    Physically Abused: No    Sexually Abused: No    Family History  Problem Relation Age of Onset   Diabetes Mother    COPD Father    Lung cancer Father 68   Breast cancer Maternal Aunt 76        dx again at 79~metastatic   Cancer Paternal Aunt        jaw cancer dx 72s   Breast cancer Cousin        dx 50s-60s. MAT that had breast cancer's daughter   Throat cancer Cousin      Current Outpatient Medications:    acetaminophen  (TYLENOL ) 500 MG tablet, Take 1,000 mg by mouth every 6 (six) hours as needed for moderate pain. , Disp: , Rfl:    levocetirizine (XYZAL) 5 MG tablet, Take 5 mg by mouth every evening., Disp: , Rfl:    metoprolol  tartrate (LOPRESSOR ) 25 MG tablet, Take 1/2 (one-half) tablet by mouth twice daily, Disp: 30 tablet, Rfl: 6   Multiple Vitamins-Minerals (MULTIVITAMIN WITH MINERALS) tablet, Take 1 tablet by mouth daily., Disp: , Rfl:    tamoxifen  (NOLVADEX ) 20 MG tablet, Take 1 tablet by mouth once daily, Disp: 90 tablet, Rfl: 0  Physical exam:  Vitals:   02/03/24 0940  BP: 127/65  Pulse: 98  Resp: 20  Temp: 98.6 F (37 C)  SpO2: 99%  Weight: 151 lb 6.4 oz (68.7 kg)   Physical Exam Cardiovascular:     Rate and Rhythm: Normal rate and regular rhythm.     Heart sounds: Normal heart sounds.  Pulmonary:     Effort: Pulmonary effort is normal.     Breath sounds: Normal breath sounds.  Skin:    General: Skin is warm and dry.  Neurological:     Mental Status: She is alert and oriented to person, place, and time.    Breast exam was performed in seated and lying down position. Patient is status post left lumpectomy with a well-healed surgical scar. No evidence of any palpable masses. No evidence of axillary adenopathy. No evidence of any palpable masses or lumps in the right breast. No evidence of right axillary adenopathy   I have personally reviewed labs listed below:    Latest Ref Rng & Units 12/27/2023   10:30 AM  CMP  Glucose 70 - 99 mg/dL 80   BUN 6 - 24 mg/dL 8   Creatinine 1.88 - 4.16 mg/dL 6.06   Sodium 301 - 601 mmol/L 143   Potassium 3.5 - 5.2 mmol/L 4.0   Chloride 96 - 106 mmol/L 104   CO2 20 - 29 mmol/L 24   Calcium 8.7 - 10.2 mg/dL 8.8    Total Protein 6.0 - 8.5 g/dL 6.6   Total  Bilirubin 0.0 - 1.2 mg/dL 0.3   Alkaline Phos 44 - 121 IU/L 80   AST 0 - 40 IU/L 18   ALT 0 - 32 IU/L 12       Latest Ref Rng & Units 12/27/2023   10:30 AM  CBC  WBC 3.4 - 10.8 x10E3/uL 6.9   Hemoglobin 11.1 - 15.9 g/dL 16.1   Hematocrit 09.6 - 46.6 % 37.4   Platelets 150 - 450 x10E3/uL 233     Assessment and plan- Patient is a 53 y.o. female with history of atypical ductal hyperplasia currently on tamoxifen  here for a routine follow  up  I discussed the results of bone density scan with the patient from January 2025 which shows normal bone density.  Mammogram from December 2024 was within normal limits.  She is tolerating tamoxifen  well without any significant side effects and will continue to take it until August 2026.  I will see her back in 6 months no labs   Visit Diagnosis 1. High risk medication use   2. Encounter for monitoring tamoxifen  therapy   3. Atypical ductal hyperplasia of left breast      Dr. Seretha Dance, MD, MPH [facility] [phone] 02/03/2024 10:31 AM

## 2024-02-20 ENCOUNTER — Other Ambulatory Visit: Payer: Self-pay

## 2024-02-20 ENCOUNTER — Other Ambulatory Visit: Payer: Self-pay | Admitting: Oncology

## 2024-02-20 MED ORDER — TAMOXIFEN CITRATE 20 MG PO TABS: 20.0000 mg | ORAL_TABLET | Freq: Every day | ORAL | 0 refills | Status: DC

## 2024-07-02 ENCOUNTER — Other Ambulatory Visit: Payer: Self-pay | Admitting: Obstetrics and Gynecology

## 2024-07-02 DIAGNOSIS — Z1231 Encounter for screening mammogram for malignant neoplasm of breast: Secondary | ICD-10-CM

## 2024-08-05 ENCOUNTER — Inpatient Hospital Stay: Payer: Self-pay | Admitting: Oncology

## 2024-09-08 ENCOUNTER — Ambulatory Visit
Admission: RE | Admit: 2024-09-08 | Discharge: 2024-09-08 | Disposition: A | Source: Ambulatory Visit | Attending: Obstetrics and Gynecology | Admitting: Obstetrics and Gynecology

## 2024-09-08 ENCOUNTER — Other Ambulatory Visit: Payer: Self-pay | Admitting: Cardiovascular Disease

## 2024-09-08 DIAGNOSIS — Z1231 Encounter for screening mammogram for malignant neoplasm of breast: Secondary | ICD-10-CM

## 2024-09-09 ENCOUNTER — Inpatient Hospital Stay: Payer: Self-pay | Attending: Oncology | Admitting: Oncology

## 2024-09-09 ENCOUNTER — Encounter: Payer: Self-pay | Admitting: Oncology

## 2024-09-09 VITALS — BP 118/72 | HR 66 | Temp 98.2°F | Resp 19 | Ht 66.0 in | Wt 169.6 lb

## 2024-09-09 DIAGNOSIS — N6092 Unspecified benign mammary dysplasia of left breast: Secondary | ICD-10-CM | POA: Diagnosis not present

## 2024-09-09 DIAGNOSIS — Z79899 Other long term (current) drug therapy: Secondary | ICD-10-CM

## 2024-09-09 DIAGNOSIS — Z808 Family history of malignant neoplasm of other organs or systems: Secondary | ICD-10-CM | POA: Insufficient documentation

## 2024-09-09 DIAGNOSIS — Z79810 Long term (current) use of selective estrogen receptor modulators (SERMs): Secondary | ICD-10-CM | POA: Insufficient documentation

## 2024-09-09 DIAGNOSIS — Z5181 Encounter for therapeutic drug level monitoring: Secondary | ICD-10-CM | POA: Diagnosis not present

## 2024-09-09 DIAGNOSIS — Z801 Family history of malignant neoplasm of trachea, bronchus and lung: Secondary | ICD-10-CM | POA: Diagnosis not present

## 2024-09-09 DIAGNOSIS — Z803 Family history of malignant neoplasm of breast: Secondary | ICD-10-CM | POA: Insufficient documentation

## 2024-09-09 NOTE — Progress Notes (Signed)
 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  She recently underwent a screening mammogram in October 2020 with which showed possible architectural distortion in the left breast as well as a possible mass in the right breast.  Ultrasound of the right breast showed a benign cyst in the right breast.  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  Patient was seen by Dr. Lane and she underwent a lumpectomy which showed a radial scar with florid ductal hyperplasia and sclerosing adenosis.  Normal residual atypical proliferative breast disease or evidence of DCIS or invasive malignancy.  Margins were negative   Risks and benefits of chemoprevention for atypical ductal hyperplasia but tamoxifen  was discussed.  Patient started taking tamoxifen  in August 2021.  She attained menopause at 48 and no menstrual cycle since then  Interval history-  Denise Washburn is a 53 year old female with atypical ductal hyperplasia of the breast who presents for  routine oncology follow-up after completing five years of tamoxifen  therapy.  She completed a five-year course of tamoxifen  for atypical ductal hyperplasia in August 2025 without adverse effects or complications. She did not experience any issues related to tamoxifen , and her appetite and weight remained stable throughout therapy.  A screening mammogram was performed yesterday; results are pending. Bone health was evaluated in January.  She feels well overall and has not noted any new symptoms or changes in her health status since her last visit.      ECOG PS- 1 Pain scale- 0   Review of systems- Review of Systems  Constitutional:  Negative for chills, fever, malaise/fatigue and weight loss.  HENT:  Negative for congestion, ear discharge and nosebleeds.   Eyes:  Negative for blurred vision.  Respiratory:  Negative for cough, hemoptysis, sputum production, shortness of breath and wheezing.   Cardiovascular:  Negative for chest pain, palpitations, orthopnea and claudication.  Gastrointestinal:  Negative for abdominal pain, blood in stool, constipation, diarrhea, heartburn, melena, nausea and vomiting.  Genitourinary:  Negative for dysuria, flank pain, frequency, hematuria and urgency.  Musculoskeletal:  Negative for back pain, joint pain and myalgias.  Skin:  Negative for rash.  Neurological:  Negative for dizziness, tingling, focal weakness, seizures, weakness and headaches.  Endo/Heme/Allergies:  Does not bruise/bleed easily.  Psychiatric/Behavioral:  Negative for depression and suicidal ideas. The patient does not have insomnia.       Allergies  Allergen Reactions   Other Swelling    ANTS     Past Medical History:  Diagnosis Date   AVNRT (AV nodal re-entry tachycardia)    a. 10/2020 s/p EPS and RFCA; b. 12/2021 Zio: Predominantly sinus rhythm at 80 (44-197).  2 runs of  nonsustained VT, longest 8 beats at 147, fastest 4 beats at 197.  27 SVT runs, longest 11 beats at 125, fastest  6 beats at 185.  4.3% PAC burden.  Rare PVCs.   Chest pain    a. 08/2015 ETT: Ex time 9:01, Max HR 166, No ECG changes->Nl study.   COVID-19 08/31/2019   Family history of breast cancer    Family history of lung cancer    Family history of throat cancer    Hypertension    Mild Mitral regurgitation    a. 08/2015 Echo: EF 50%, no rwma, Triv TR/PR, mild MR; b. 10/2020 Echo: EF 60-65%, no rwma, nl RV size/fxn, no MR/MS; c. 03/2022 Echo: EF 55-60%, no rwma, nl RV fxn, no MR.   NSVT (nonsustained ventricular tachycardia) (HCC)    a. 12/2021 Zio: 2 NSVT runs, longest 8 beats @ 147.   Premature atrial contractions    a. 12/2021 Zio: 4.3% PAC burden.   Wears dentures    partial upper     Past Surgical History:  Procedure Laterality Date   BREAST BIOPSY Left 08/11/2019   distortion, coil marker, radial scar and ADH   BREAST BIOPSY Left 10/07/2019   Procedure: BREAST BIOPSY WITH NEEDLE LOCALIZATION;  Surgeon: Lane Shope, MD;  Location: [facility] ORS;  Service: General;  Laterality: Left;   BREAST LUMPECTOMY Left 10/07/2019   radial scar, ADH, dense stromal fibrosis excisied, clear surgical margins   CARPAL TUNNEL RELEASE     CATARACT EXTRACTION W/PHACO Right 07/13/2020   Procedure: CATARACT EXTRACTION PHACO AND INTRAOCULAR LENS PLACEMENT (IOC) RIGHT;  Surgeon: Mittie Gaskin, MD;  Location: [facility];  Service: Ophthalmology;  Laterality: Right;  7.53 1:21.3 9.3%   CATARACT EXTRACTION W/PHACO Left 08/03/2020   Procedure: CATARACT EXTRACTION PHACO AND INTRAOCULAR LENS PLACEMENT (IOC) LEFT 4.52 00:44.3 10.3%;  Surgeon: Mittie Gaskin, MD;  Location: [facility];  Service: Ophthalmology;  Laterality: Left;   CHOLECYSTECTOMY     COLONOSCOPY WITH PROPOFOL  N/A 12/07/2019   Procedure: COLONOSCOPY WITH PROPOFOL ;  Surgeon: Jinny Carmine, MD;  Location: [facility];  Service: Endoscopy;  Laterality: N/A;   SVT ABLATION N/A 10/24/2020   Procedure: SVT ABLATION;  Surgeon:  Cindie Ole DASEN, MD;  Location: [facility] INVASIVE CV LAB;  Service: Cardiovascular;  Laterality: N/A;   TUBAL LIGATION      Social History   Socioeconomic History   Marital status: Married    Spouse name: Not on file   Number of children: 2   Years of education: Not on file   Highest education level: Not on file  Occupational History   Not on file  Tobacco Use   Smoking status: Never   Smokeless tobacco: Never  Vaping Use   Vaping status: Never Used  Substance and Sexual Activity   Alcohol use: No    Alcohol/week: 0.0 standard drinks of alcohol   Drug use: No   Sexual activity: Not Currently  Other Topics Concern   Not on file  Social History Narrative   Not on file   Social Drivers of Health   Financial Resource Strain: Low Risk  (07/01/2024)   Received from [facility]   Overall Financial Resource Strain (CARDIA)    Difficulty of Paying Living Expenses: Not hard at all  Food Insecurity: No Food Insecurity (07/01/2024)   Received from [facility]   Hunger Vital Sign    Within the past 12 months, you worried that your food would run out  before you got the money to buy more.: Never true    Within the past 12 months, the food you bought just didn't last and you didn't have money to get more.: Never true  Transportation Needs: No Transportation Needs (07/01/2024)   Received from [facility] - Transportation    In the past 12 months, has lack of transportation kept you from medical appointments or from getting medications?: No    Lack of Transportation (Non-Medical): No  Physical Activity: Inactive (09/25/2017)   Exercise Vital Sign    Days of Exercise per Week: 0 days    Minutes of Exercise per Session: 0 min  Stress: No Stress Concern Present (09/25/2017)   [employer] of Occupational [employer] - Occupational Stress Questionnaire    Feeling of Stress : Not at all  Social Connections: Moderately Integrated  (09/25/2017)   Social Connection and Isolation Panel    Frequency of Communication with Friends and Family: More than three times a week    Frequency of Social Gatherings with Friends and Family: Three times a week    Attends Religious Services: More than 4 times per year    Active Member of Clubs or Organizations: No    Attends Banker Meetings: Never    Marital Status: Married  Catering Manager Violence: Not At Risk (12/27/2023)   Humiliation, Afraid, Rape, and Kick questionnaire    Fear of Current or Ex-Partner: No    Emotionally Abused: No    Physically Abused: No    Sexually Abused: No    Family History  Problem Relation Age of Onset   Diabetes Mother    COPD Father    Lung cancer Father 16   Breast cancer Maternal Aunt 103       dx again at 79~metastatic   Cancer Paternal Aunt        jaw cancer dx 25s   Breast cancer Cousin        dx 50s-60s. MAT that had breast cancer's daughter   Throat cancer Cousin      Current Outpatient Medications:    acetaminophen  (TYLENOL ) 500 MG tablet, Take 1,000 mg by mouth every 6 (six) hours as needed for moderate pain. , Disp: , Rfl:    levocetirizine (XYZAL) 5 MG tablet, Take 5 mg by mouth every evening., Disp: , Rfl:    metoprolol  tartrate (LOPRESSOR ) 25 MG tablet, Take 1/2 (one-half) tablet by mouth twice daily, Disp: 30 tablet, Rfl: 3   Multiple Vitamins-Minerals (MULTIVITAMIN WITH MINERALS) tablet, Take 1 tablet by mouth daily., Disp: , Rfl:    tamoxifen  (NOLVADEX ) 20 MG tablet, Take 1 tablet by mouth once daily, Disp: 90 tablet, Rfl: 0   tamoxifen  (NOLVADEX ) 20 MG tablet, Take 1 tablet (20 mg total) by mouth daily., Disp: 90 tablet, Rfl: 0  Physical exam:  Vitals:   09/09/24 0948  BP: 118/72  Pulse: 66  Resp: 19  Temp: 98.2 F (36.8 C)  TempSrc: Tympanic  SpO2: 99%  Weight: 169 lb 9.6 oz (76.9 kg)  Height: 5' 6 (1.676 m)   Physical Exam Cardiovascular:     Rate and Rhythm: Normal rate and regular rhythm.      Heart sounds: Normal heart sounds.  Pulmonary:     Effort: Pulmonary effort is normal.     Breath sounds: Normal breath sounds.  Skin:    General: Skin is warm and dry.  Neurological:     Mental Status: She is alert and oriented  to person, place, and time.      I have personally reviewed labs listed below:    Latest Ref Rng & Units 12/27/2023   10:30 AM  CMP  Glucose 70 - 99 mg/dL 80   BUN 6 - 24 mg/dL 8   Creatinine 9.42 - 8.99 mg/dL 9.16   Sodium 865 - 855 mmol/L 143   Potassium 3.5 - 5.2 mmol/L 4.0   Chloride 96 - 106 mmol/L 104   CO2 20 - 29 mmol/L 24   Calcium 8.7 - 10.2 mg/dL 8.8   Total Protein 6.0 - 8.5 g/dL 6.6   Total Bilirubin 0.0 - 1.2 mg/dL 0.3   Alkaline Phos 44 - 121 IU/L 80   AST 0 - 40 IU/L 18   ALT 0 - 32 IU/L 12       Latest Ref Rng & Units 12/27/2023   10:30 AM  CBC  WBC 3.4 - 10.8 x10E3/uL 6.9   Hemoglobin 11.1 - 15.9 g/dL 87.5   Hematocrit 65.9 - 46.6 % 37.4   Platelets 150 - 450 x10E3/uL 233      Assessment and plan- Patient is a 53 y.o. female with history of atypical ductal hyperplasia on tamoxifen  here for a routine follow-up  Assessment and Plan    Atypical ductal hyperplasia of breast - Clinically patient is doing well presently and reports no new breast concerns Completed five years of tamoxifen  without adverse effects. No evidence supports use beyond five years. Bone health well-managed.  I will see her back in 6 months no labs - Discontinued tamoxifen  after five years. - Await results of recent screening mammogram and follow up as indicated. - No further intervention required for bone health.         Visit Diagnosis 1. High risk medication use   2. Atypical ductal hyperplasia of left breast   3. Encounter for monitoring tamoxifen  therapy      Dr. Annah Skene, MD, MPH [facility] [phone] 09/09/2024 3:55 PM

## 2024-09-09 NOTE — Progress Notes (Signed)
 Patient states she has no new or acute concerns at this time.

## 2024-09-21 ENCOUNTER — Other Ambulatory Visit: Payer: Self-pay | Admitting: Oncology

## 2024-09-30 ENCOUNTER — Other Ambulatory Visit: Payer: Self-pay | Admitting: Oncology

## 2024-10-02 NOTE — Telephone Encounter (Signed)
 Per Dr. Melanee She started tamoxifen  in August 2021. So 5 years will be August 2026please let her know I am renewing her prescription at this time.  Script sent to [facility] in [place].  Outbound call to patient; informed of above.  Patient indicated that's what she was told at her last office visit; no further questions at this time.

## 2024-10-02 NOTE — Telephone Encounter (Signed)
 She started tamoxifen  in August 2021. So 5 years will be August 2026please let her know I am renewing her prescription at this time

## 2024-10-29 ENCOUNTER — Ambulatory Visit
Admission: EM | Admit: 2024-10-29 | Discharge: 2024-10-29 | Disposition: A | Discharge status: Home / Self Care | Attending: Family Medicine | Admitting: Family Medicine

## 2024-10-29 DIAGNOSIS — U071 COVID-19: Secondary | ICD-10-CM | POA: Diagnosis not present

## 2024-10-29 MED ORDER — AZELASTINE HCL 0.1 % NA SOLN: 1.0000 | Freq: Two times a day (BID) | NASAL | 0 refills | Status: AC

## 2024-10-29 MED ORDER — PROMETHAZINE-DM 6.25-15 MG/5ML PO SYRP: 5.0000 mL | ORAL_SOLUTION | Freq: Four times a day (QID) | ORAL | 0 refills | Status: AC | PRN

## 2024-10-29 MED ORDER — MOLNUPIRAVIR EUA 200MG CAPSULE: 4.0000 | ORAL_CAPSULE | Freq: Two times a day (BID) | ORAL | 0 refills | Status: AC

## 2024-10-29 NOTE — ED Provider Notes (Signed)
" [facility]    CSN: [id number] Arrival date & time: 10/29/24  9176      History   Chief Complaint No chief complaint on file.   HPI Tammy Acevedo is a 54 y.o. female.   Patient presenting today with new onset sore throat, headache, chills, rhinorrhea, cough that started last night.  Denies fever, chest pain, shortness of breath, abdominal pain, vomiting, diarrhea.  Recent COVID exposure so tested herself at home yesterday and it was positive.  So far trying Tylenol  with minimal relief.    Past Medical History:  Diagnosis Date   AVNRT (AV nodal re-entry tachycardia)    a. 10/2020 s/p EPS and RFCA; b. 12/2021 Zio: Predominantly sinus rhythm at 80 (44-197).  2 runs of nonsustained VT, longest 8 beats at 147, fastest 4 beats at 197.  27 SVT runs, longest 11 beats at 125, fastest 6 beats at 185.  4.3% PAC burden.  Rare PVCs.   Chest pain    a. 08/2015 ETT: Ex time 9:01, Max HR 166, No ECG changes->Nl study.   COVID-19 08/31/2019   Family history of breast cancer    Family history of lung cancer    Family history of throat cancer    Hypertension    Mild Mitral regurgitation    a. 08/2015 Echo: EF 50%, no rwma, Triv TR/PR, mild MR; b. 10/2020 Echo: EF 60-65%, no rwma, nl RV size/fxn, no MR/MS; c. 03/2022 Echo: EF 55-60%, no rwma, nl RV fxn, no MR.   NSVT (nonsustained ventricular tachycardia) (HCC)    a. 12/2021 Zio: 2 NSVT runs, longest 8 beats @ 147.   Premature atrial contractions    a. 12/2021 Zio: 4.3% PAC burden.   Wears dentures    partial upper    Patient Active Problem List   Diagnosis Date Noted   Essential hypertension 10/03/2022   Genetic testing 04/06/2020   Family history of throat cancer    Atypical ductal hyperplasia of left breast 08/18/2019   Gastroesophageal reflux disease without esophagitis 12/17/2017   Anxiety 12/17/2017   PSVT (paroxysmal supraventricular tachycardia) 04/04/2017   Bilateral carpal tunnel syndrome 03/20/2012    Past  Surgical History:  Procedure Laterality Date   BREAST BIOPSY Left 08/11/2019   distortion, coil marker, radial scar and ADH   BREAST BIOPSY Left 10/07/2019   Procedure: BREAST BIOPSY WITH NEEDLE LOCALIZATION;  Surgeon: Tannya Gonet Shope, MD;  Location: [facility] ORS;  Service: General;  Laterality: Left;   BREAST LUMPECTOMY Left 10/07/2019   radial scar, ADH, dense stromal fibrosis excisied, clear surgical margins   CARPAL TUNNEL RELEASE     CATARACT EXTRACTION W/PHACO Right 07/13/2020   Procedure: CATARACT EXTRACTION PHACO AND INTRAOCULAR LENS PLACEMENT (IOC) RIGHT;  Surgeon: Mittie Gaskin, MD;  Location: [facility];  Service: Ophthalmology;  Laterality: Right;  7.53 1:21.3 9.3%   CATARACT EXTRACTION W/PHACO Left 08/03/2020   Procedure: CATARACT EXTRACTION PHACO AND INTRAOCULAR LENS PLACEMENT (IOC) LEFT 4.52 00:44.3 10.3%;  Surgeon: Mittie Gaskin, MD;  Location: [facility];  Service: Ophthalmology;  Laterality: Left;   CHOLECYSTECTOMY     COLONOSCOPY WITH PROPOFOL  N/A 12/07/2019   Procedure: COLONOSCOPY WITH PROPOFOL ;  Surgeon: Jinny Carmine, MD;  Location: [facility];  Service: Endoscopy;  Laterality: N/A;   SVT ABLATION N/A 10/24/2020   Procedure: SVT ABLATION;  Surgeon: Cindie Ole DASEN, MD;  Location: [facility] INVASIVE CV LAB;  Service: Cardiovascular;  Laterality: N/A;   TUBAL LIGATION      OB History  No obstetric history on file.      Home Medications    Prior to Admission medications  Medication Sig Start Date End Date Taking? Authorizing Provider  azelastine  (ASTELIN ) 0.1 % nasal spray Place 1 spray into both nostrils 2 (two) times daily. Use in each nostril as directed 10/29/24  Yes Stuart Vernell Norris, PA-C  molnupiravir  EUA (LAGEVRIO ) 200 mg CAPS capsule Take 4 capsules (800 mg total) by mouth 2 (two) times daily for 5 days. 10/29/24 11/03/24 Yes Stuart Vernell Norris, PA-C  promethazine -dextromethorphan (PROMETHAZINE -DM) 6.25-15 MG/5ML syrup  Take 5 mLs by mouth 4 (four) times daily as needed. 10/29/24  Yes Stuart Vernell Norris, PA-C  acetaminophen  (TYLENOL ) 500 MG tablet Take 1,000 mg by mouth every 6 (six) hours as needed for moderate pain.     [provider]  levocetirizine (XYZAL) 5 MG tablet Take 5 mg by mouth every evening.    [provider]  metoprolol  tartrate (LOPRESSOR ) 25 MG tablet Take 1/2 (one-half) tablet by mouth twice daily 09/08/24   Gollan, Timothy J, MD  Multiple Vitamins-Minerals (MULTIVITAMIN WITH MINERALS) tablet Take 1 tablet by mouth daily.    [provider]  tamoxifen  (NOLVADEX ) 20 MG tablet Take 1 tablet by mouth once daily 02/25/24   Rao, Archana C, MD  tamoxifen  (NOLVADEX ) 20 MG tablet Take 1 tablet by mouth once daily 10/02/24   Melanee Annah BROCKS, MD    Family History Family History  Problem Relation Age of Onset   Diabetes Mother    COPD Father    Lung cancer Father 22   Breast cancer Maternal Aunt 59       dx again at 79~metastatic   Cancer Paternal Aunt        jaw cancer dx 81s   Breast cancer Cousin        dx 50s-60s. MAT that had breast cancer's daughter   Throat cancer Cousin     Social History Social History[1]   Allergies   Other   Review of Systems Review of Systems PER HPI  Physical Exam Triage Vital Signs ED Triage Vitals  Encounter Vitals Group     BP 10/29/24 0829 137/85     Girls Systolic BP Percentile --      Girls Diastolic BP Percentile --      Boys Systolic BP Percentile --      Boys Diastolic BP Percentile --      Pulse Rate 10/29/24 0829 78     Resp 10/29/24 0829 20     Temp 10/29/24 0829 98.1 F (36.7 C)     Temp Source 10/29/24 0829 Oral     SpO2 10/29/24 0829 96 %     Weight --      Height --      Head Circumference --      Peak Flow --      Pain Score 10/29/24 0830 0     Pain Loc --      Pain Education --      Exclude from Growth Chart --    No data found.  Updated Vital Signs BP 137/85 (BP Location: Right Arm)    Pulse 78   Temp 98.1 F (36.7 C) (Oral)   Resp 20   SpO2 96%   Visual Acuity Right Eye Distance:   Left Eye Distance:   Bilateral Distance:    Right Eye Near:   Left Eye Near:    Bilateral Near:     Physical Exam Vitals and  nursing note reviewed.  Constitutional:      Appearance: Normal appearance.  HENT:     Head: Atraumatic.     Right Ear: Tympanic membrane and external ear normal.     Left Ear: Tympanic membrane and external ear normal.     Nose: Rhinorrhea present.     Mouth/Throat:     Mouth: Mucous membranes are moist.     Pharynx: Posterior oropharyngeal erythema present.  Eyes:     Extraocular Movements: Extraocular movements intact.     Conjunctiva/sclera: Conjunctivae normal.  Cardiovascular:     Rate and Rhythm: Normal rate and regular rhythm.     Heart sounds: Normal heart sounds.  Pulmonary:     Effort: Pulmonary effort is normal.     Breath sounds: Normal breath sounds. No wheezing or rales.  Musculoskeletal:        General: Normal range of motion.     Cervical back: Normal range of motion and neck supple.  Skin:    General: Skin is warm and dry.  Neurological:     Mental Status: She is alert and oriented to person, place, and time.  Psychiatric:        Mood and Affect: Mood normal.        Thought Content: Thought content normal.    UC Treatments / Results  Labs (all labs ordered are listed, but only abnormal results are displayed) Labs Reviewed - No data to display  EKG  Radiology No results found.  Procedures Procedures (including critical care time)  Medications Ordered in UC Medications - No data to display  Initial Impression / Assessment and Plan / UC Course  I have reviewed the triage vital signs and the nursing notes.  Pertinent labs & imaging results that were available during my care of the patient were reviewed by me and considered in my medical decision making (see chart for details).     Home COVID test positive.  Will  treat with molnupiravir  as the tamoxifen  and Paxlovid have an interaction.  Will treat symptomatically with Astelin , Phenergan  DM, supportive over-the-counter medications and home care additionally.  Return for worsening or unresolving symptoms.  Final Clinical Impressions(s) / UC Diagnoses   Final diagnoses:  COVID-19     Discharge Instructions      In addition to the prescribed medications, you may use over-the-counter remedies such as Coricidin HBP, plain Mucinex, saline sinus rinses, humidifiers, Tylenol  as needed.  Drink plenty of fluids, get lots of rest.  Follow-up for significant worsening symptoms    ED Prescriptions     Medication Sig Dispense Auth. Provider   molnupiravir  EUA (LAGEVRIO ) 200 mg CAPS capsule Take 4 capsules (800 mg total) by mouth 2 (two) times daily for 5 days. 40 capsule Stuart Vernell Norris, PA-C   azelastine  (ASTELIN ) 0.1 % nasal spray Place 1 spray into both nostrils 2 (two) times daily. Use in each nostril as directed 30 mL Stuart Vernell Norris, PA-C   promethazine -dextromethorphan (PROMETHAZINE -DM) 6.25-15 MG/5ML syrup Take 5 mLs by mouth 4 (four) times daily as needed. 100 mL Stuart Vernell Norris, [place]      PDMP not reviewed this encounter.    [1]  Social History Tobacco Use   Smoking status: Never   Smokeless tobacco: Never  Vaping Use   Vaping status: Never Used  Substance Use Topics   Alcohol use: No    Alcohol/week: 0.0 standard drinks of alcohol   Drug use: No     Stuart Vernell Norris, PA-C  10/29/24 1049 ° °"

## 2024-10-29 NOTE — ED Triage Notes (Signed)
 Pt reports positive home COVID test x last night at 8:30 pt states she is having sore throat, headache, and chills. Had exposure Monday.

## 2024-10-29 NOTE — Discharge Instructions (Addendum)
 In addition to the prescribed medications, you may use over-the-counter remedies such as Coricidin HBP, plain Mucinex, saline sinus rinses, humidifiers, Tylenol  as needed.  Drink plenty of fluids, get lots of rest.  Follow-up for significant worsening symptoms

## 2025-03-10 ENCOUNTER — Inpatient Hospital Stay: Admitting: Oncology

## 2025-03-17 ENCOUNTER — Inpatient Hospital Stay: Admitting: Oncology
